# Patient Record
Sex: Female | Born: 1955
Health system: Southern US, Community
[De-identification: ages and names within clinical notes are randomized; demographics above are authoritative.]

## PROBLEM LIST (undated history)

## (undated) DIAGNOSIS — M1990 Unspecified osteoarthritis, unspecified site: Secondary | ICD-10-CM

## (undated) DIAGNOSIS — R519 Headache, unspecified: Secondary | ICD-10-CM

## (undated) DIAGNOSIS — K5792 Diverticulitis of intestine, part unspecified, without perforation or abscess without bleeding: Secondary | ICD-10-CM

## (undated) DIAGNOSIS — I2510 Atherosclerotic heart disease of native coronary artery without angina pectoris: Secondary | ICD-10-CM

## (undated) DIAGNOSIS — E7800 Pure hypercholesterolemia, unspecified: Secondary | ICD-10-CM

## (undated) DIAGNOSIS — I10 Essential (primary) hypertension: Secondary | ICD-10-CM

## (undated) DIAGNOSIS — K219 Gastro-esophageal reflux disease without esophagitis: Secondary | ICD-10-CM

## (undated) DIAGNOSIS — R51 Headache: Secondary | ICD-10-CM

## (undated) HISTORY — PX: ABDOMINAL HYSTERECTOMY: SHX81

## (undated) HISTORY — PX: DILATION AND CURETTAGE OF UTERUS: SHX78

## (undated) HISTORY — PX: TUBAL LIGATION: SHX77

## (undated) HISTORY — DX: Unspecified osteoarthritis, unspecified site: M19.90

## (undated) HISTORY — DX: Gastro-esophageal reflux disease without esophagitis: K21.9

## (undated) HISTORY — DX: Diverticulitis of intestine, part unspecified, without perforation or abscess without bleeding: K57.92

---

## 1998-01-25 ENCOUNTER — Other Ambulatory Visit: Admission: RE | Admit: 1998-01-25 | Discharge: 1998-01-25 | Payer: Self-pay | Admitting: Obstetrics and Gynecology

## 2001-03-27 ENCOUNTER — Ambulatory Visit (HOSPITAL_COMMUNITY): Admission: RE | Admit: 2001-03-27 | Discharge: 2001-03-27 | Payer: Self-pay | Admitting: Internal Medicine

## 2001-08-14 ENCOUNTER — Ambulatory Visit (HOSPITAL_COMMUNITY): Admission: RE | Admit: 2001-08-14 | Discharge: 2001-08-14 | Payer: Self-pay | Admitting: Family Medicine

## 2001-08-14 ENCOUNTER — Encounter: Payer: Self-pay | Admitting: Family Medicine

## 2006-09-04 HISTORY — PX: ESOPHAGOGASTRODUODENOSCOPY: SHX1529

## 2006-09-28 ENCOUNTER — Ambulatory Visit (HOSPITAL_COMMUNITY): Admission: RE | Admit: 2006-09-28 | Discharge: 2006-09-28 | Payer: Self-pay | Admitting: Internal Medicine

## 2006-09-28 IMAGING — RF DG ESOPHAGUS
14 of 24 series · 14 of 24 positions shown · non-contrast
Comparison: none

HISTORY: Dysphagia

[Series 1: run · 1 of 1 slices shown (1 of 14)]
[im 1/1]
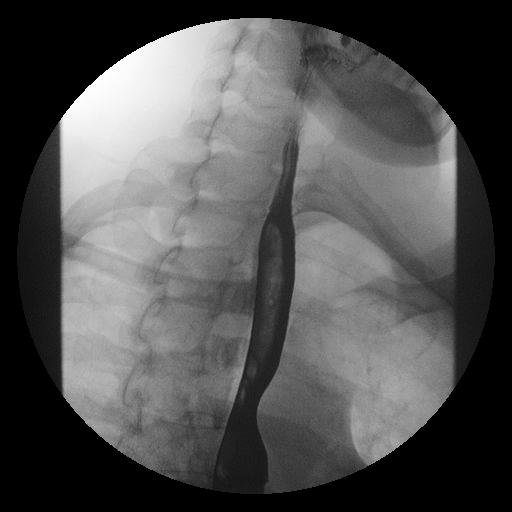

[Series 3: run · 1 of 1 slices shown (2 of 14)]
[im 1/1]
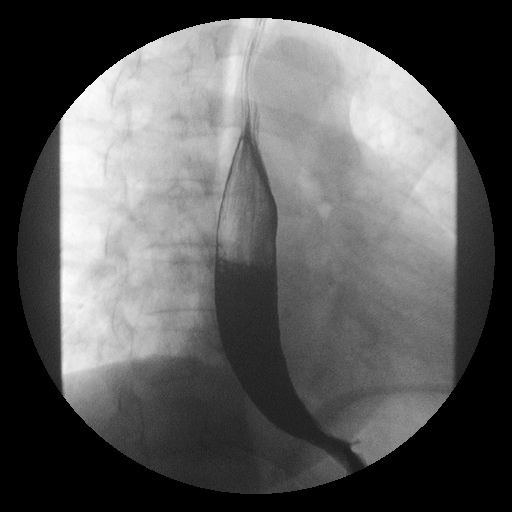

[Series 5: run · 1 of 1 slices shown (3 of 14)]
[im 1/1]
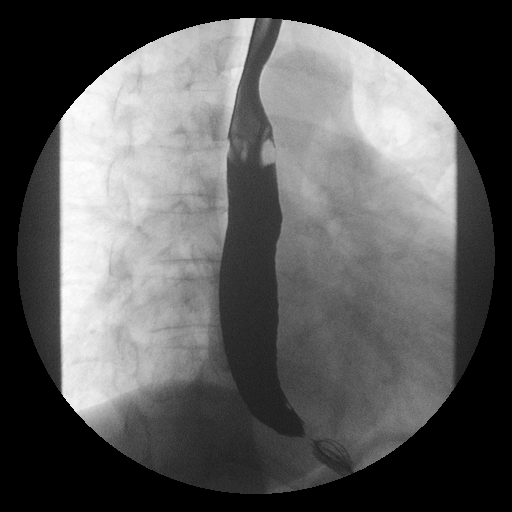

[Series 7: run · 1 of 1 slices shown (4 of 14)]
[im 1/1]
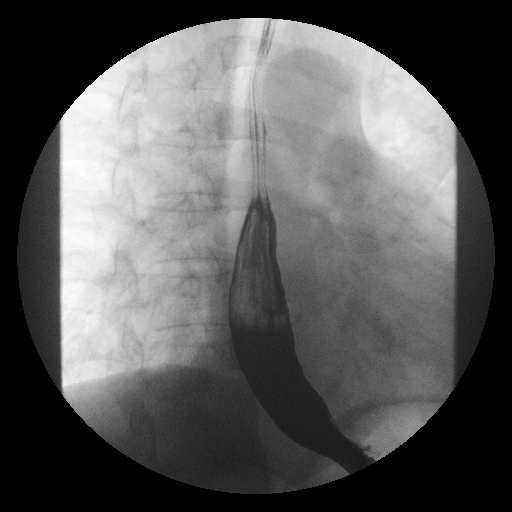

[Series 8: run · 1 of 1 slices shown (5 of 14)]
[im 1/1]
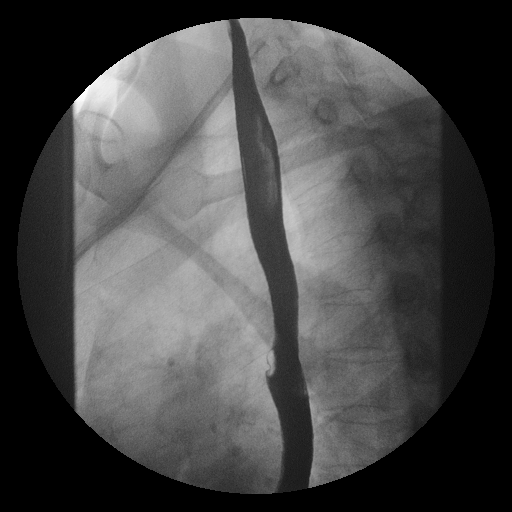

[Series 10: run · 1 of 1 slices shown (6 of 14)]
[im 1/1]
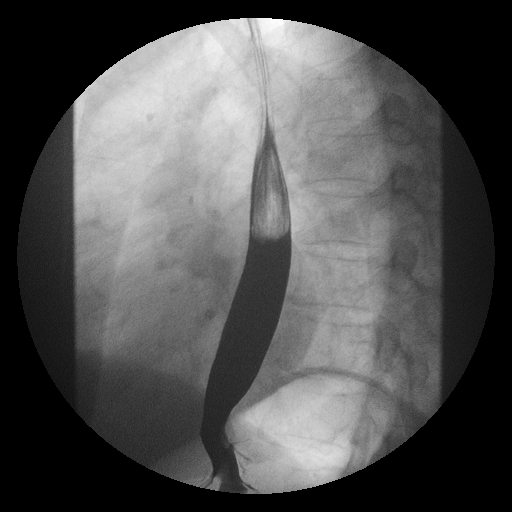

[Series 12: run · 1 of 1 slices shown (7 of 14)]
[im 1/1]
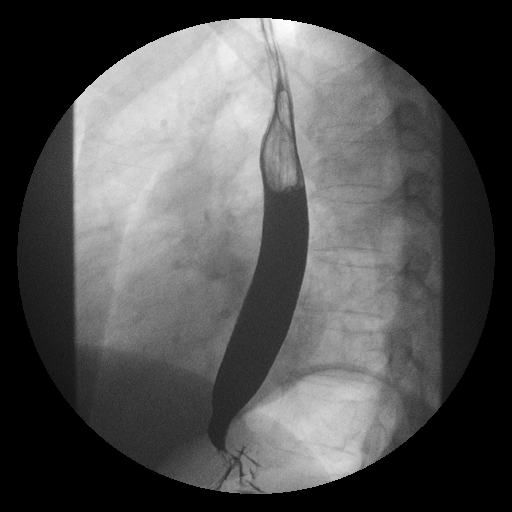

[Series 13: run · 1 of 9 slices shown (8 of 14)]
[im 1/9]
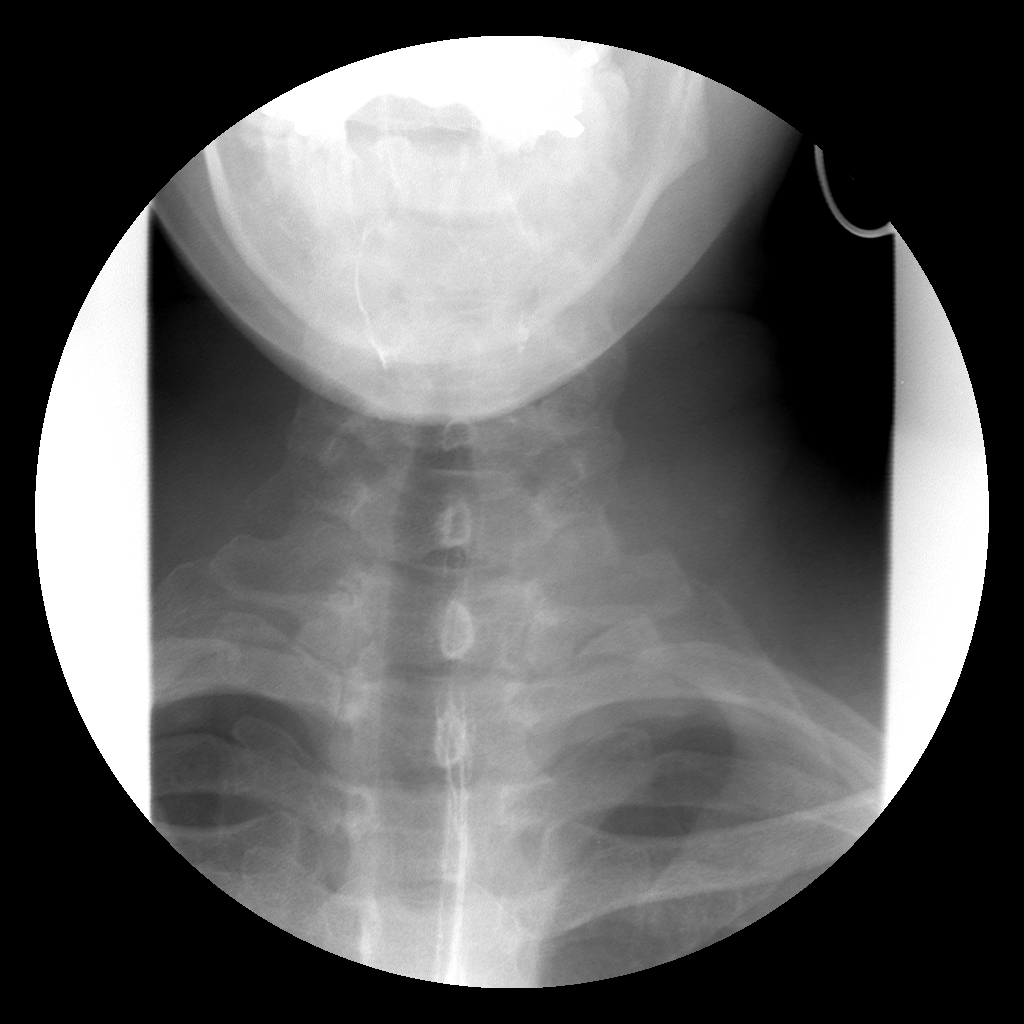

[Series 15: run · 1 of 1 slices shown (9 of 14)]
[im 1/1]
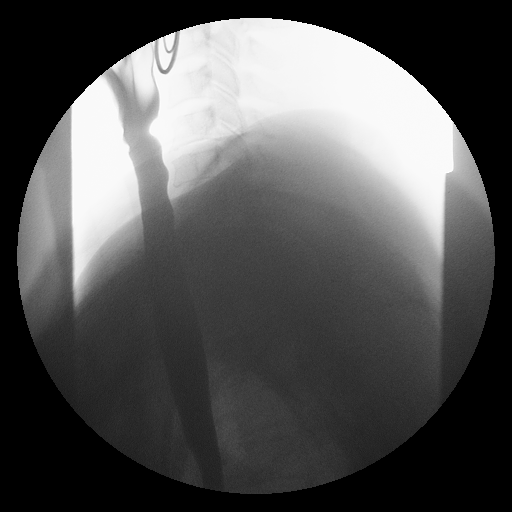

[Series 17: run · 1 of 1 slices shown (10 of 14)]
[im 1/1]
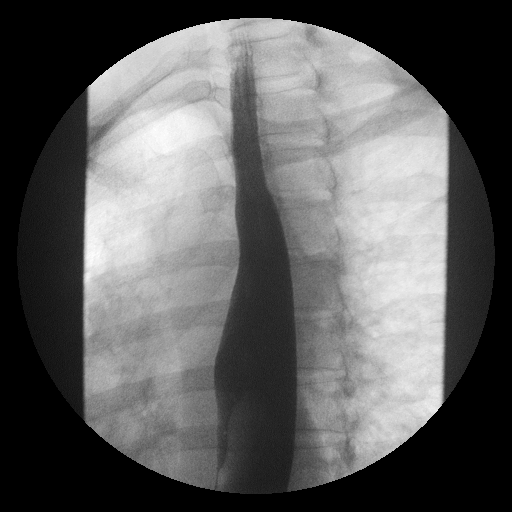

[Series 19: run · 1 of 1 slices shown (11 of 14)]
[im 1/1]
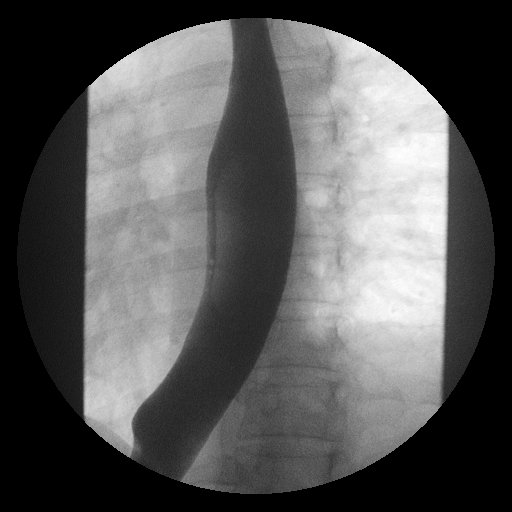

[Series 20: run · 1 of 1 slices shown (12 of 14)]
[im 1/1]
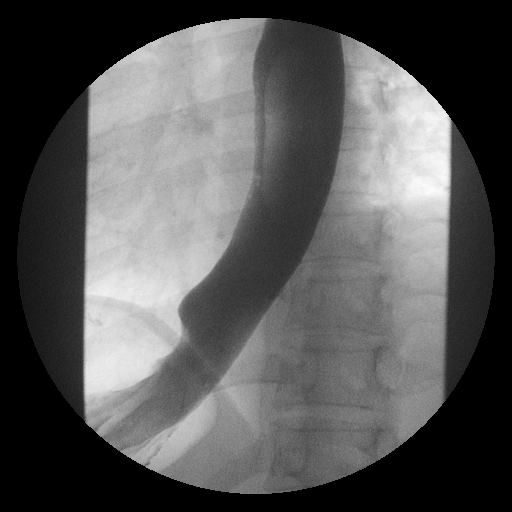

[Series 22: run · 1 of 1 slices shown (13 of 14)]
[im 1/1]
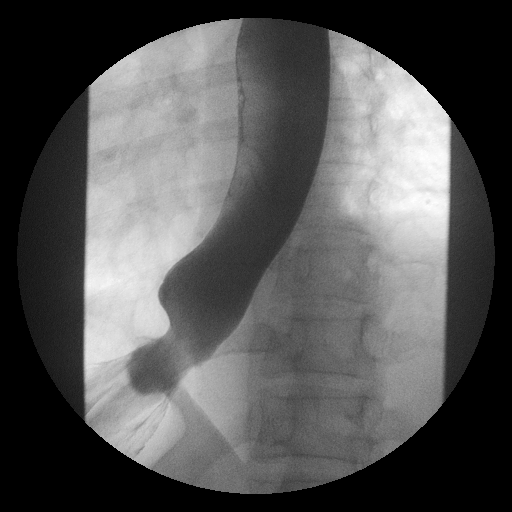

[Series 25: run · 1 of 1 slices shown (14 of 14)]
[im 1/1]
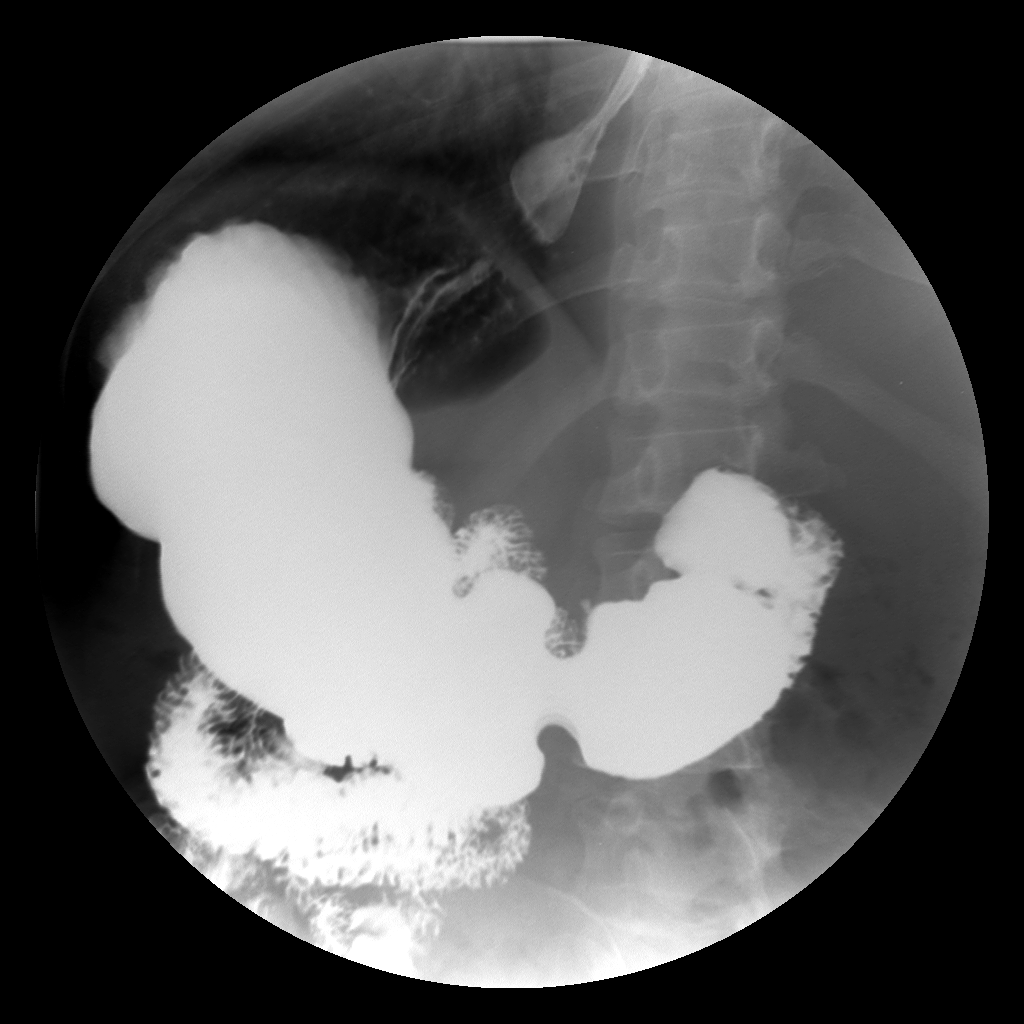

[14 of 24 positions shown; findings below may reference images not displayed]

ESOPHAGRAM:

Routine air-contrast, single-contrast, and tablet esophagram performed.

Normal esophageal distention and motility.
No esophageal stricture or obstruction.
Normal clearance of barium by primary peristaltic wave.
No mucosal irregularity on air-contrast images.
No persistent intraluminal filling defects.
Targeted rapid sequence views of hypopharynx and cervical esophagus normal.
12.5 mm diameter barium tablet freely passes from oral cavity to stomach.
IMPRESSION: Normal exam.

## 2006-10-09 ENCOUNTER — Ambulatory Visit: Payer: Self-pay | Admitting: Internal Medicine

## 2006-10-25 ENCOUNTER — Ambulatory Visit: Payer: Self-pay | Admitting: Internal Medicine

## 2006-10-25 ENCOUNTER — Ambulatory Visit (HOSPITAL_COMMUNITY): Admission: RE | Admit: 2006-10-25 | Discharge: 2006-10-25 | Payer: Self-pay | Admitting: Internal Medicine

## 2006-11-20 ENCOUNTER — Encounter (HOSPITAL_COMMUNITY): Admission: RE | Admit: 2006-11-20 | Discharge: 2006-12-20 | Payer: Self-pay | Admitting: Internal Medicine

## 2007-09-12 ENCOUNTER — Encounter (INDEPENDENT_AMBULATORY_CARE_PROVIDER_SITE_OTHER): Payer: Self-pay | Admitting: Interventional Radiology

## 2007-09-12 ENCOUNTER — Encounter: Admission: RE | Admit: 2007-09-12 | Discharge: 2007-09-12 | Payer: Self-pay | Admitting: Endocrinology

## 2007-09-12 ENCOUNTER — Other Ambulatory Visit: Admission: RE | Admit: 2007-09-12 | Discharge: 2007-09-12 | Payer: Self-pay | Admitting: Interventional Radiology

## 2007-09-12 IMAGING — US US SOFT TISSUE HEAD/NECK
1 series · 14 of 25 positions shown · non-contrast
Comparison: none

CLINICAL DATA: Palpable thyroid lesion with defect on scintigraphy.

[Series 1: us soft tissue head/neck · 0.07mm/px · 14 of 31 slices shown]
[im 1/31]
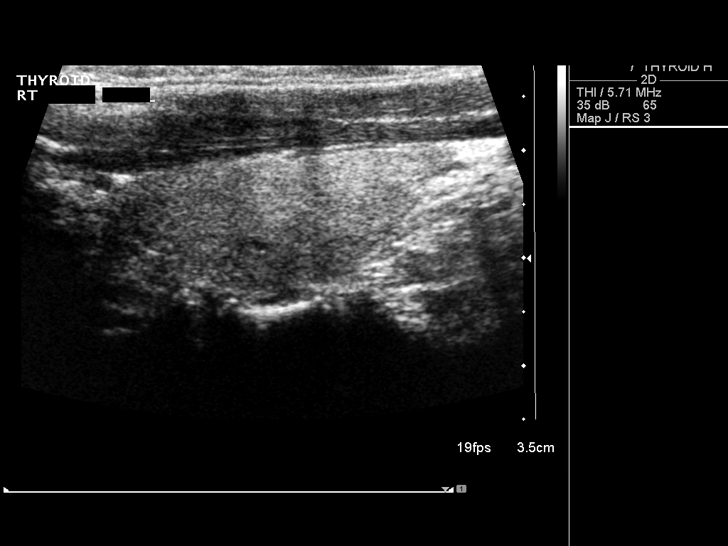
[im 3/31]
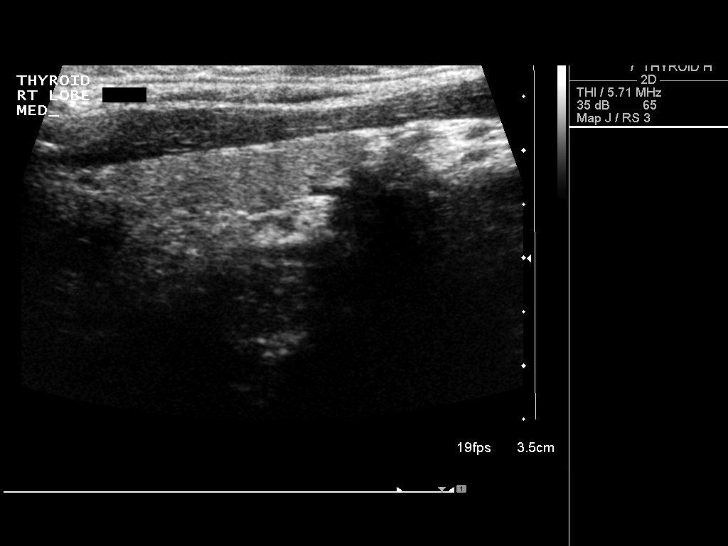
[im 6/31]
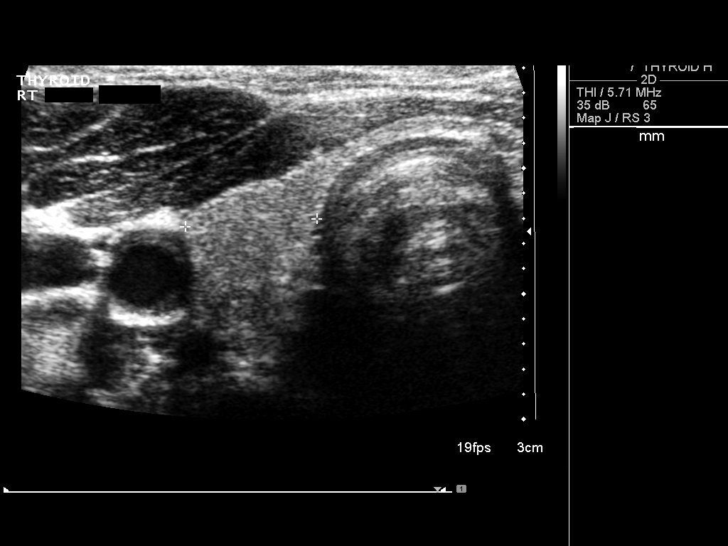
[im 8/31]
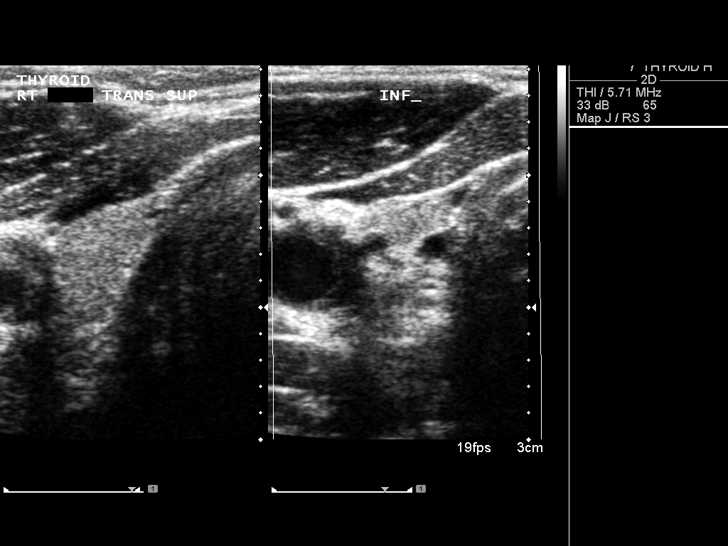
[im 11/31]
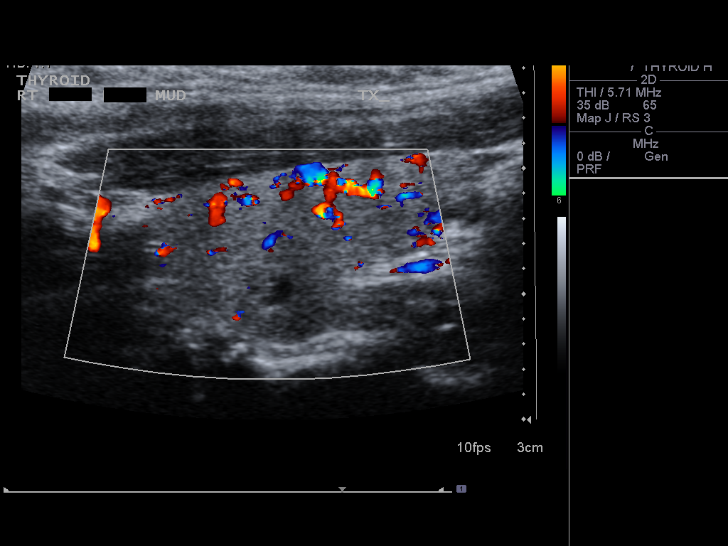
[im 12/31]
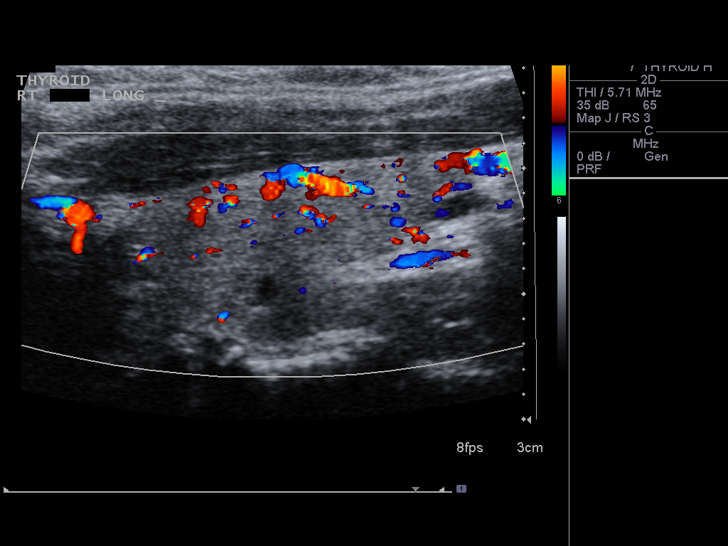
[im 14/31]
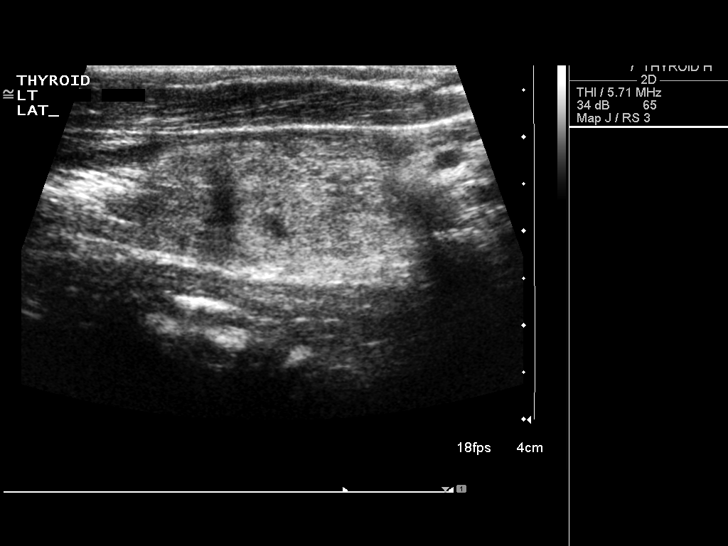
[im 17/31]
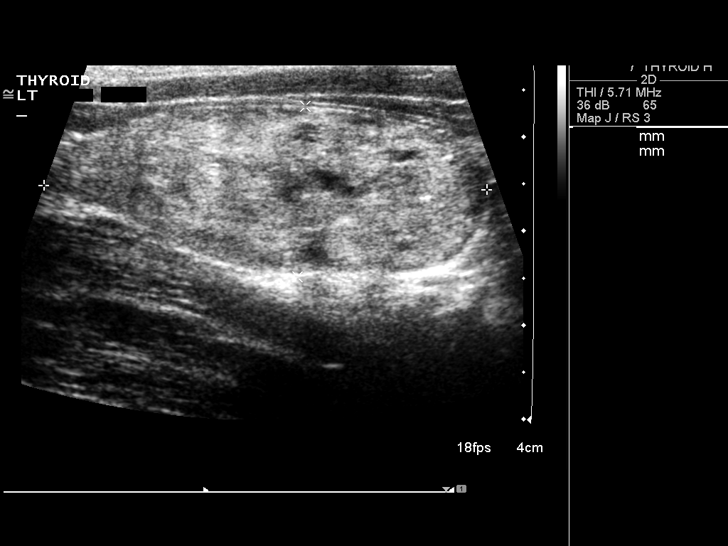
[im 19/31]
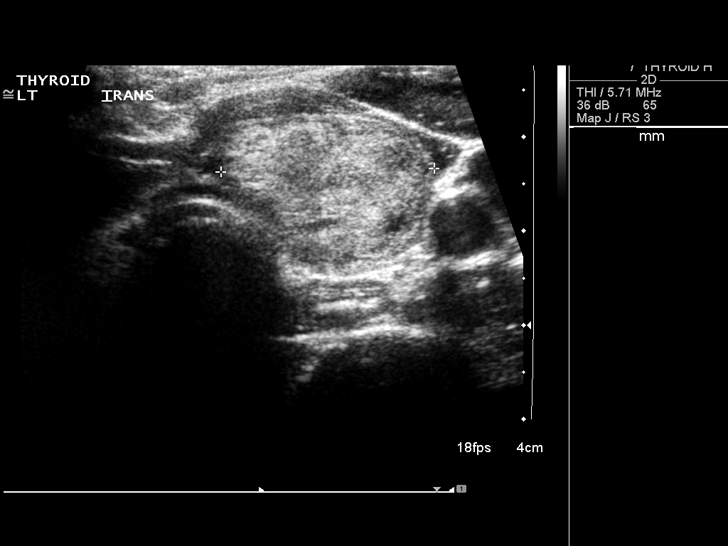
[im 21/31]
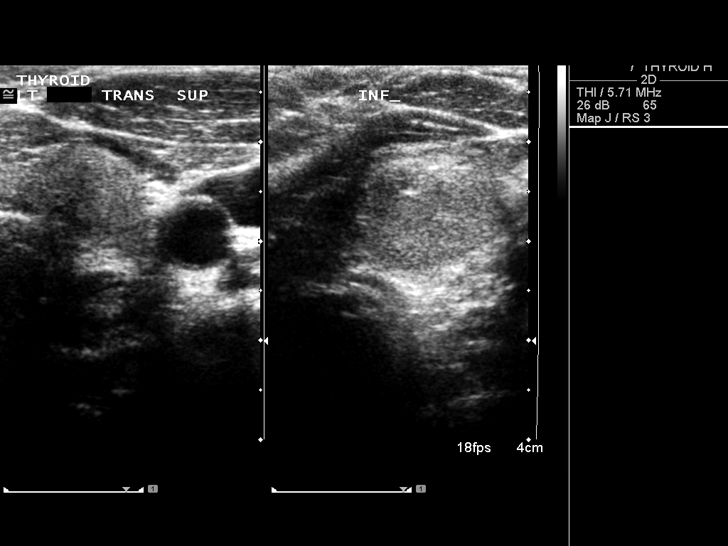
[im 23/31]
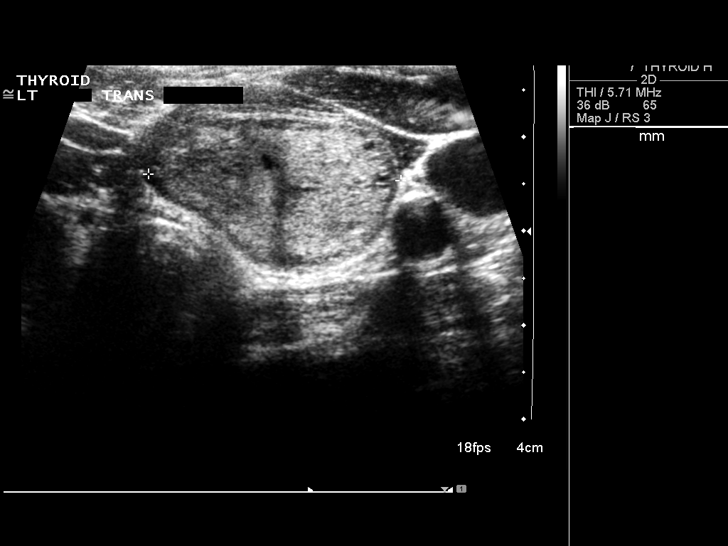
[im 26/31]
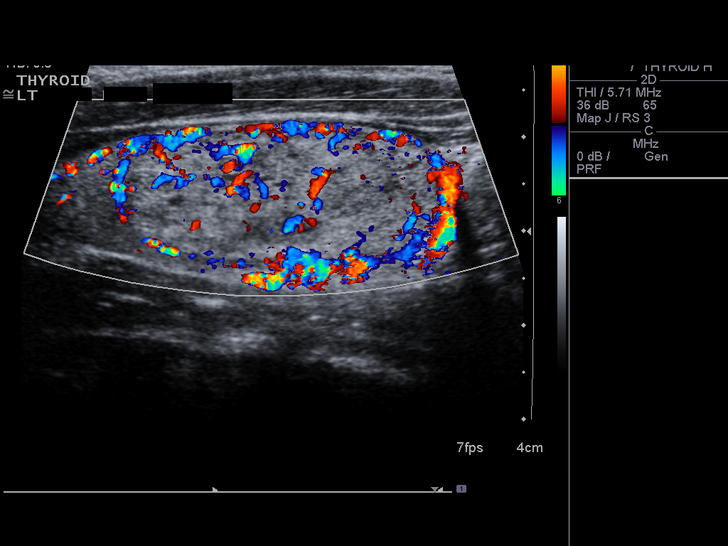
[im 28/31]
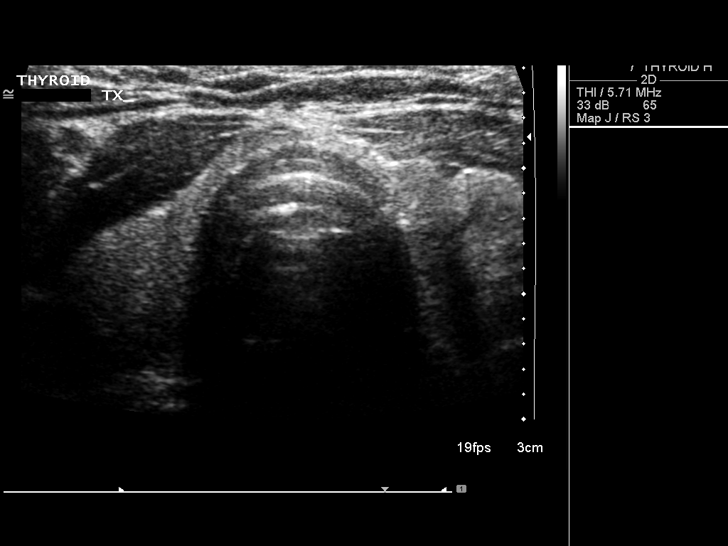
[im 31/31]
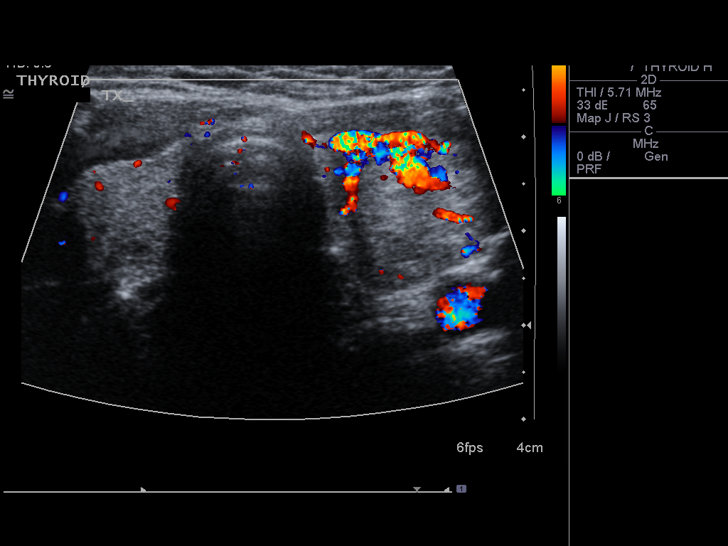

[14 of 25 positions shown; findings below may reference images not displayed]

Thyroid ultrasound:

Comparison to scintigraphy [DATE]. Left lobe measures 19 x 23 x 50 mm. There
is a solid-appearing 17 x 27 x 37 mm complex nodule in the lower pole
corresponding to the defect on scintigraphy. Scattered microcalcifications are
noted. The right lobe measures 11 x 13 x 33 mm. There is a tiny 2 x 3 x 3 mm
hypoechoic nodule in its interpolar region. The isthmus measures only 1 mm in
thickness, unremarkable.
IMPRESSION: 1. Dominant 3.7 cm left thyroid nodule. Biopsy is scheduled.

## 2007-09-12 IMAGING — US US BIOPSY
1 series · 11 of 11 positions shown · non-contrast
Comparison: Thyroid ultrasound earlier today, [DATE] ? [HOSPITAL].
 ULTRASOUND-GUIDED FINE NEEDLE ASPIRATION, LEFT LOBE OF THYROID:

CLINICAL DATA: Dominant 2 cm nodule within the left lobe of the thyroid.  Fine needle aspiration has been requested.

[Series 1: us biopsy · 11 acquisitions, 11 frames shown]
[im 1/11]
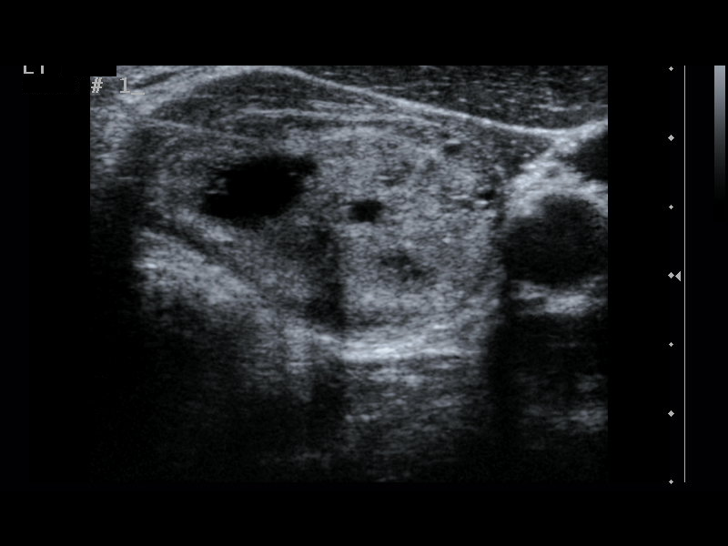
[im 2/11]
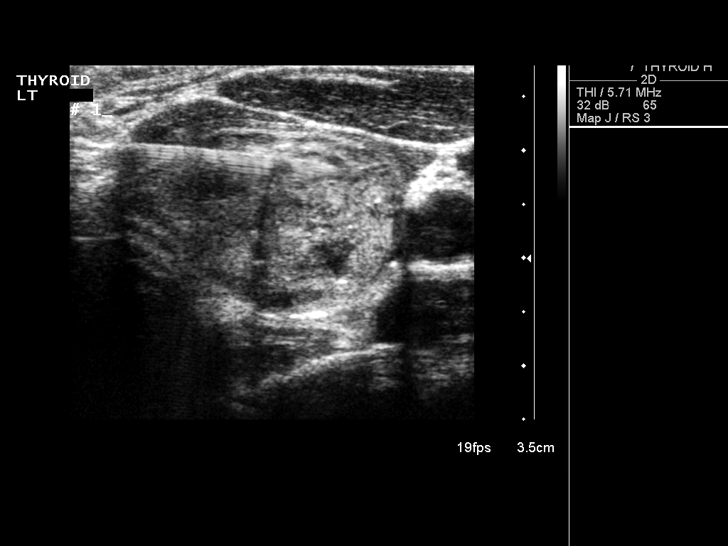
[im 3/11]
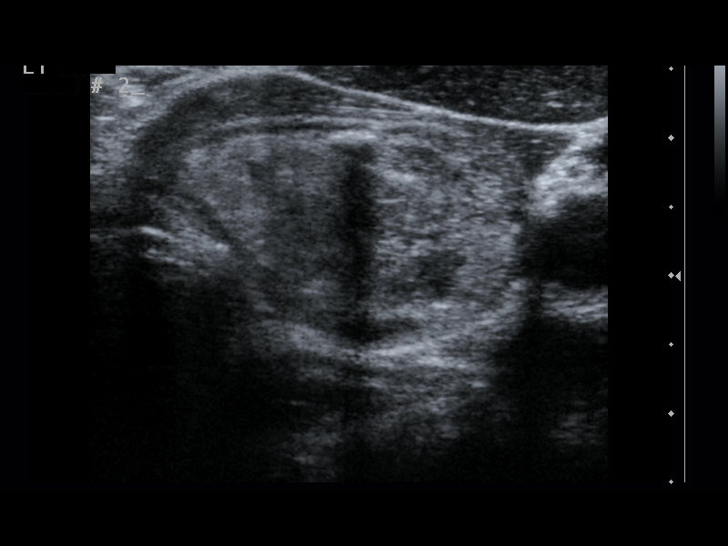
[im 4/11]
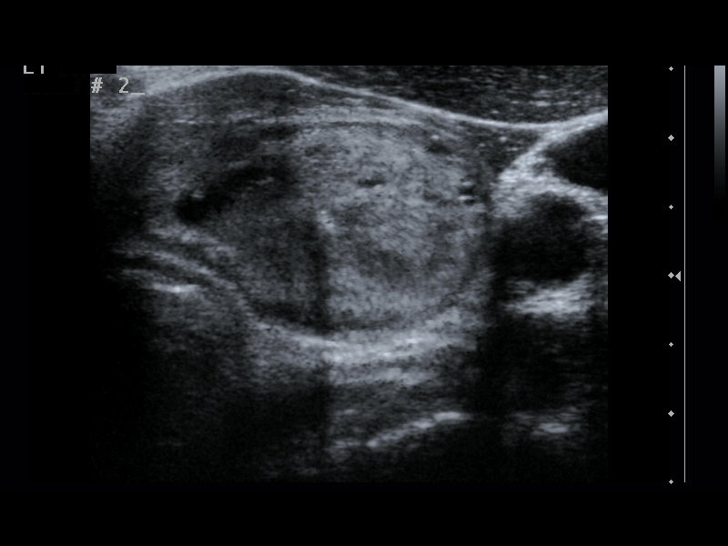
[im 5/11]
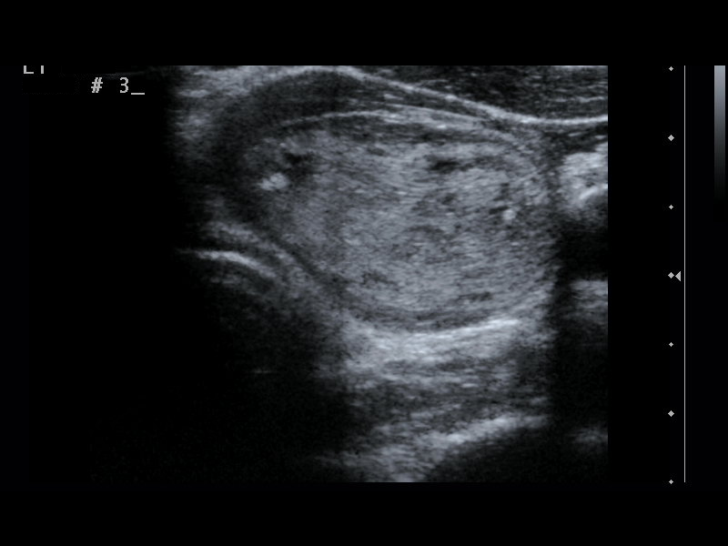
[im 6/11]
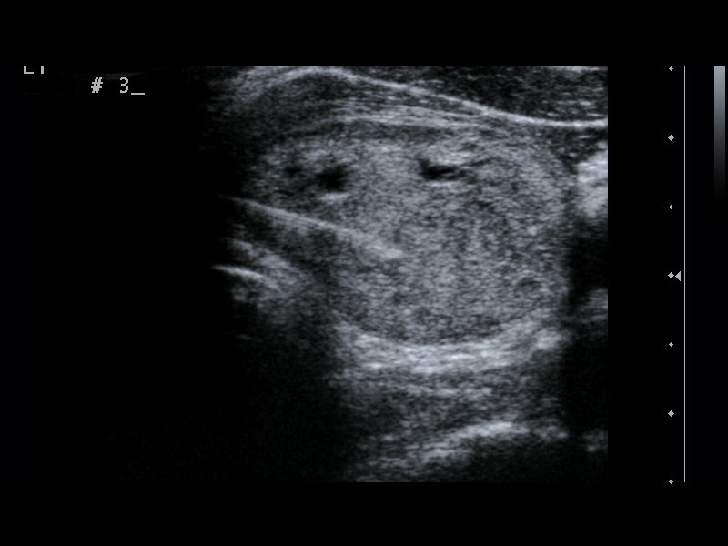
[im 7/11]
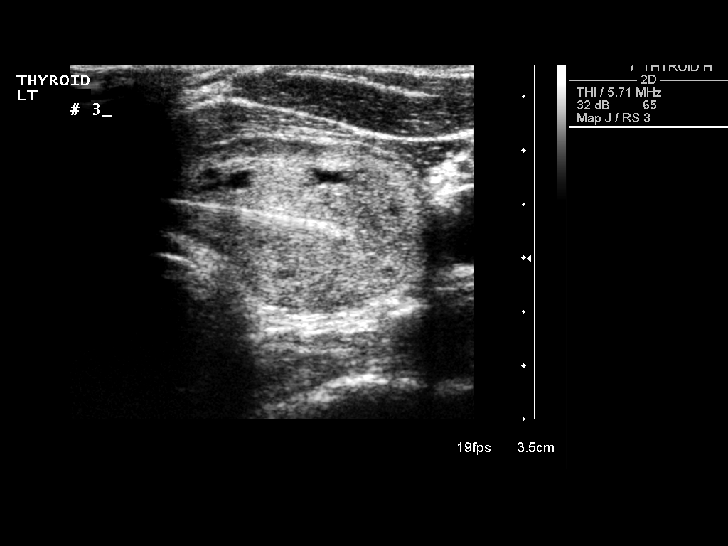
[im 8/11]
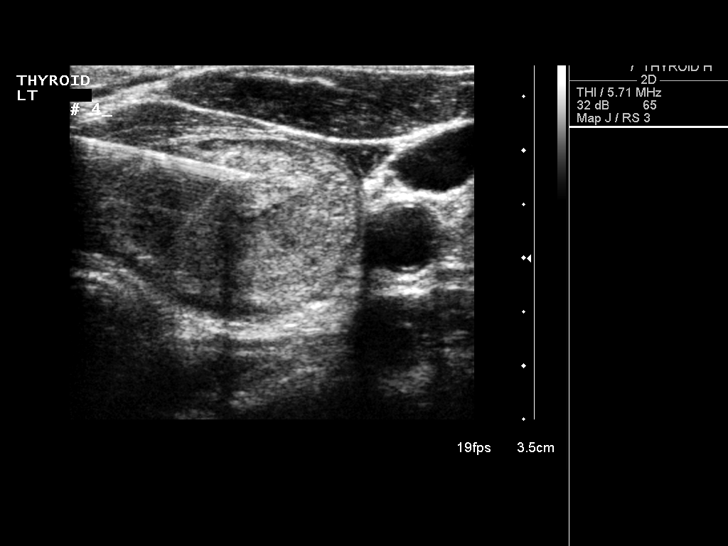
[im 9/11]
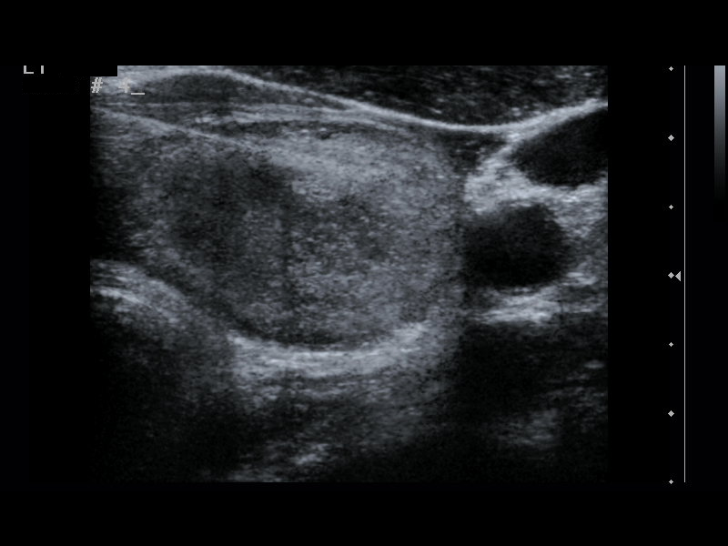
[im 10/11]
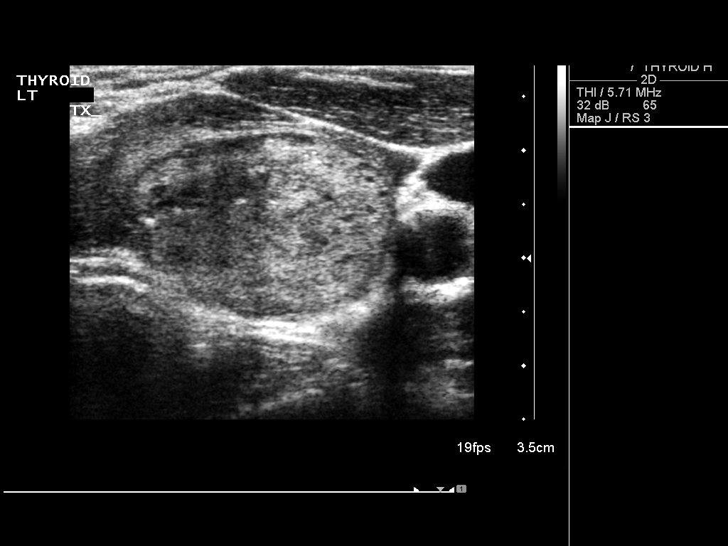
[im 11/11]
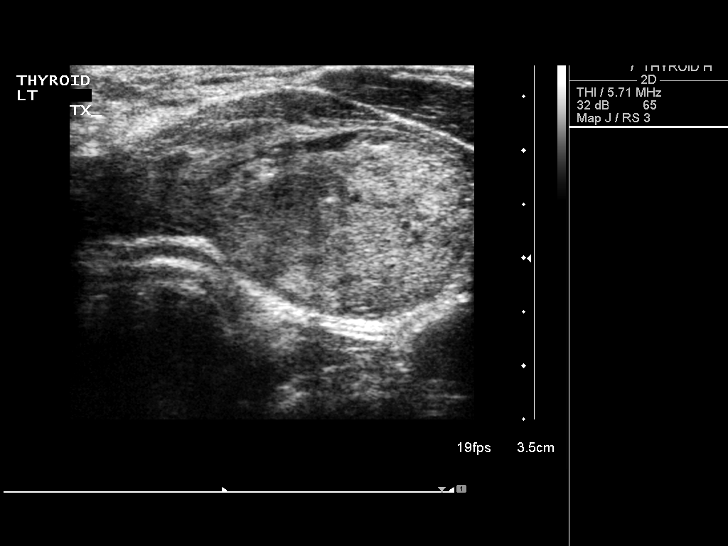

[11 of 11 positions shown; findings below may reference images not displayed]

FINDINGS: The above procedure was thoroughly discussed with the patient and written informed consent was obtained. 
 Ultrasound was then performed to localize and mark an adequate site for the biopsy.  The patient was then prepped and draped in a normal sterile fashion.  1% lidocaine was used for local anesthesia.  Using direct ultrasound guidance, four passes were made using a 25 gauge hypodermic needle into the dominant nodule occupying most of the left lobe of the thyroid measuring just over 2 cm.  Ultrasound confirmed placement of the needle on all four occasions.  The specimens were given to Pathology for further analysis.  Post procedure imaging demonstrated no hematoma or immediate complication.  The patient tolerated the procedure well.
IMPRESSION: Successful ultrasound-guided fine needle aspiration, dominant nodule, left lobe of the thyroid.  Final pathology pending.

## 2007-10-24 ENCOUNTER — Ambulatory Visit (HOSPITAL_COMMUNITY): Admission: RE | Admit: 2007-10-24 | Discharge: 2007-10-24 | Payer: Self-pay | Admitting: Internal Medicine

## 2007-11-08 ENCOUNTER — Ambulatory Visit (HOSPITAL_COMMUNITY): Admission: RE | Admit: 2007-11-08 | Discharge: 2007-11-08 | Payer: Self-pay | Admitting: Internal Medicine

## 2007-11-08 ENCOUNTER — Ambulatory Visit: Payer: Self-pay | Admitting: Internal Medicine

## 2007-11-08 ENCOUNTER — Encounter: Payer: Self-pay | Admitting: Internal Medicine

## 2007-11-08 HISTORY — PX: COLONOSCOPY: SHX174

## 2008-12-16 ENCOUNTER — Ambulatory Visit (HOSPITAL_COMMUNITY): Admission: RE | Admit: 2008-12-16 | Discharge: 2008-12-16 | Payer: Self-pay | Admitting: Internal Medicine

## 2009-04-27 ENCOUNTER — Ambulatory Visit (HOSPITAL_COMMUNITY): Admission: RE | Admit: 2009-04-27 | Discharge: 2009-04-27 | Payer: Self-pay | Admitting: Obstetrics & Gynecology

## 2009-04-27 IMAGING — MG MM DIGITAL SCREENING
5 series · 5 of 5 positions shown · non-contrast
Comparison: Prior studies.

DG SCREEN MAMMOGRAM BILATERAL
Bilateral CC and MLO view(s) were taken.
Technologist: [REDACTED]

DIGITAL SCREENING MAMMOGRAM WITH CAD:

[L CC (1 of 2)]
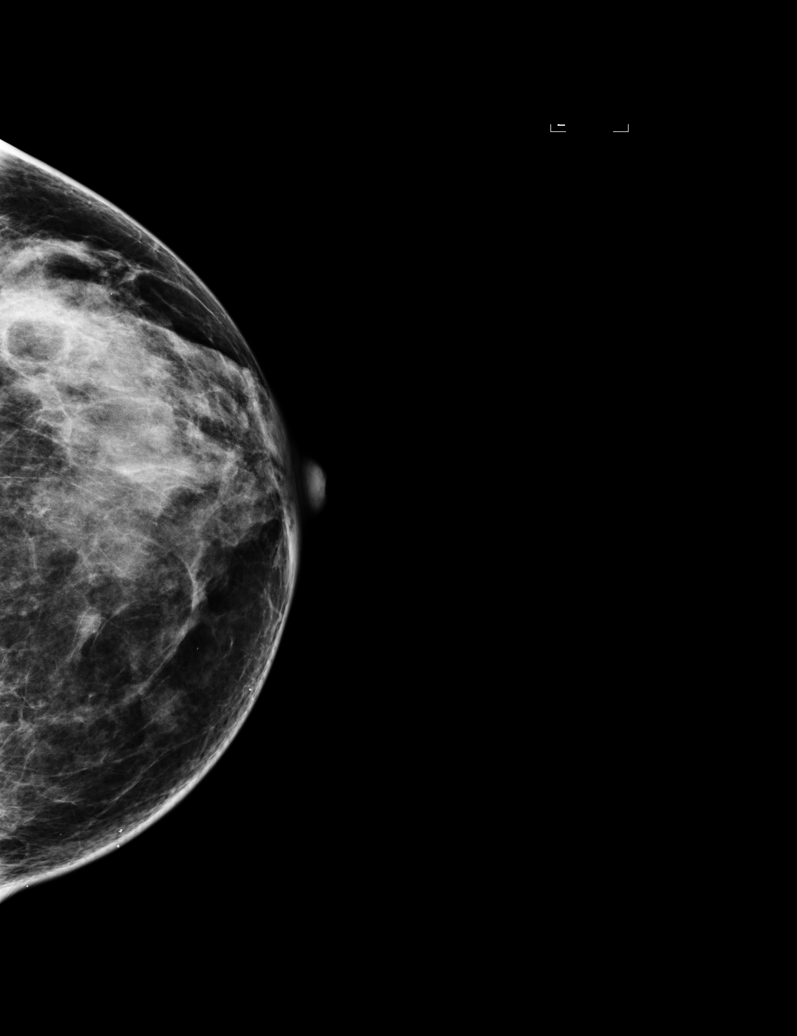

[L MLO]
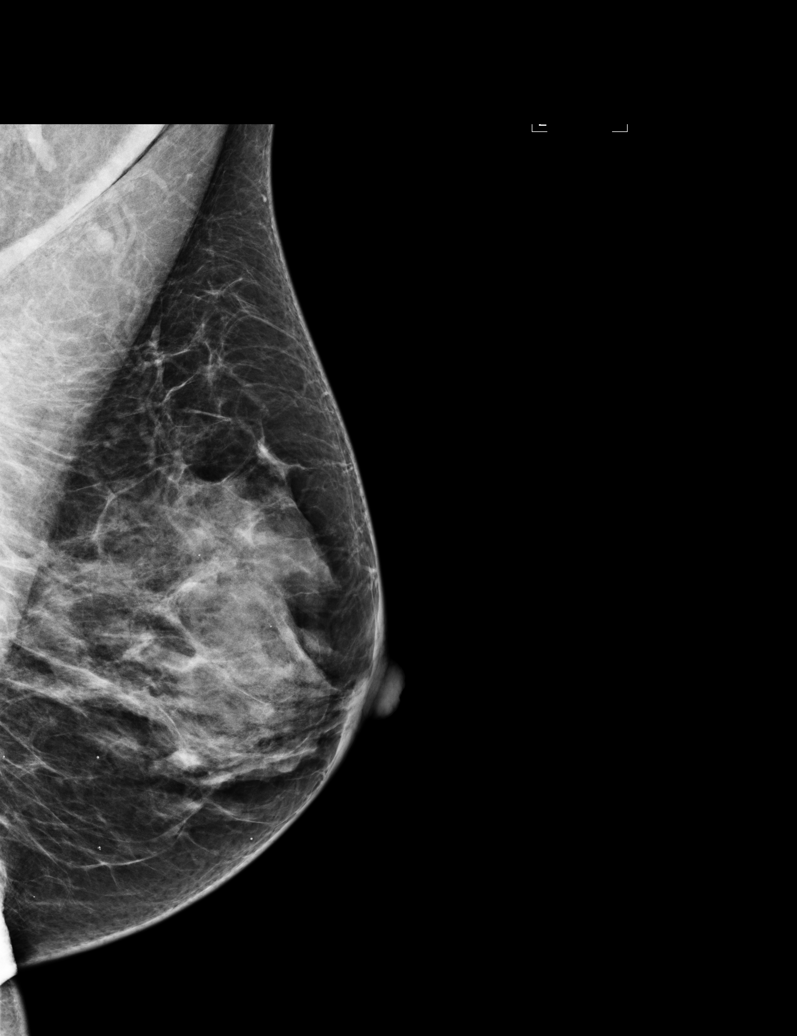

[R CC]
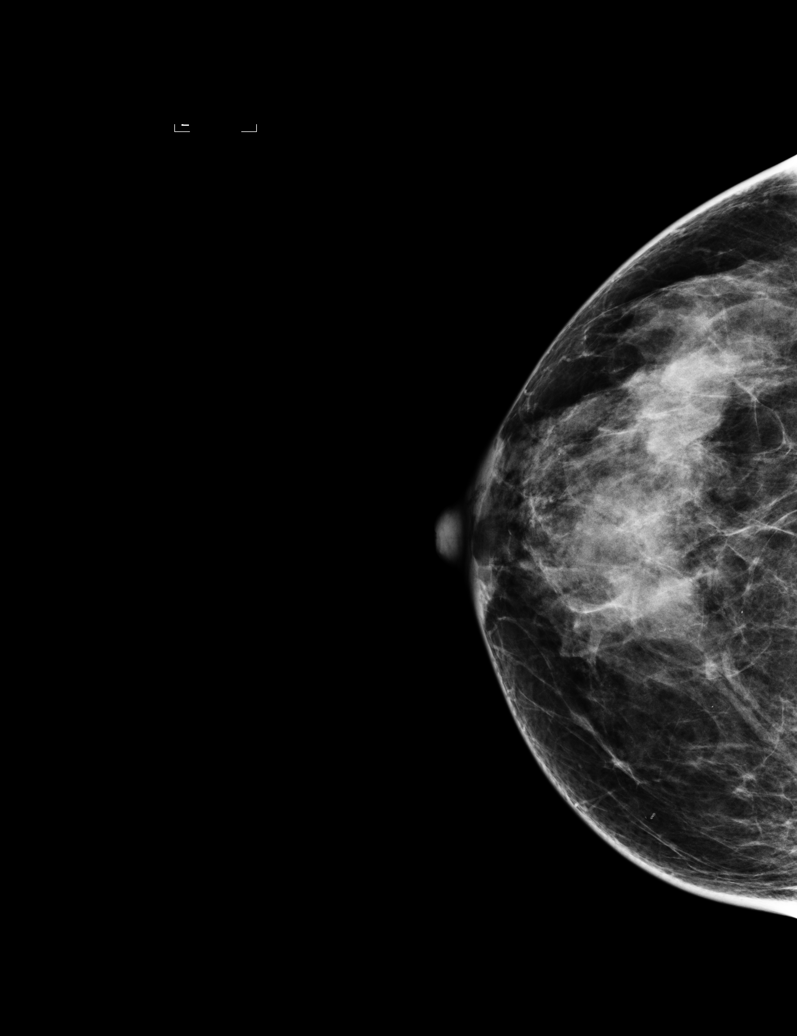

[R MLO]
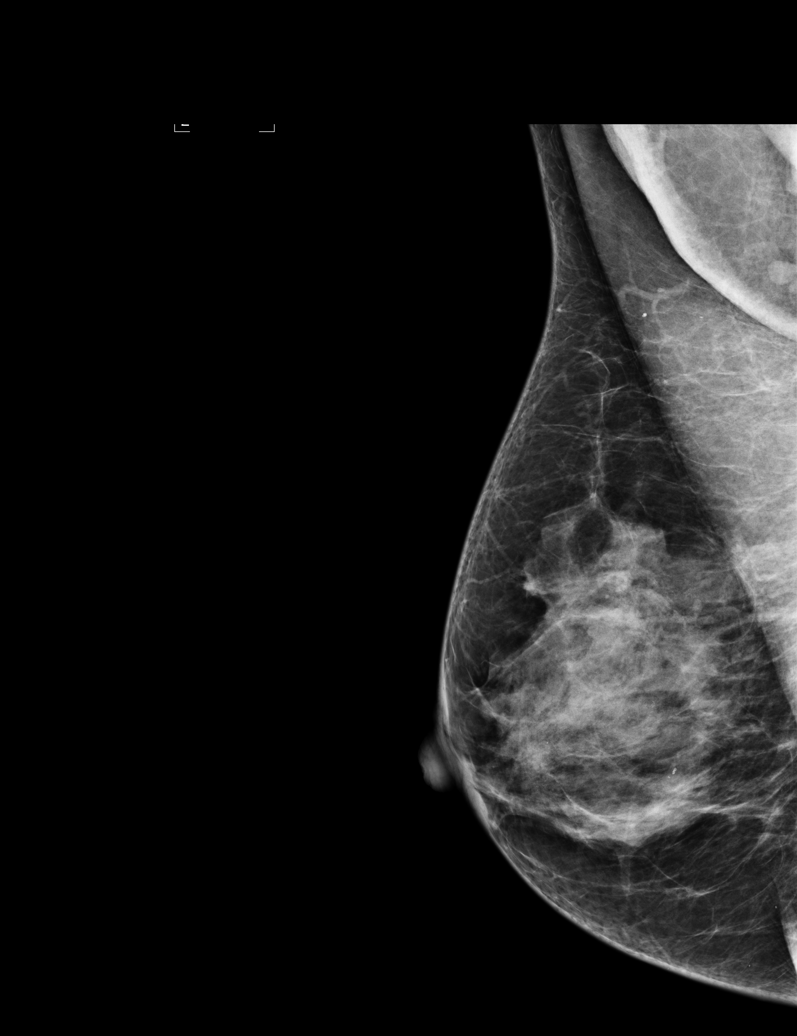

[L CC (2 of 2)]
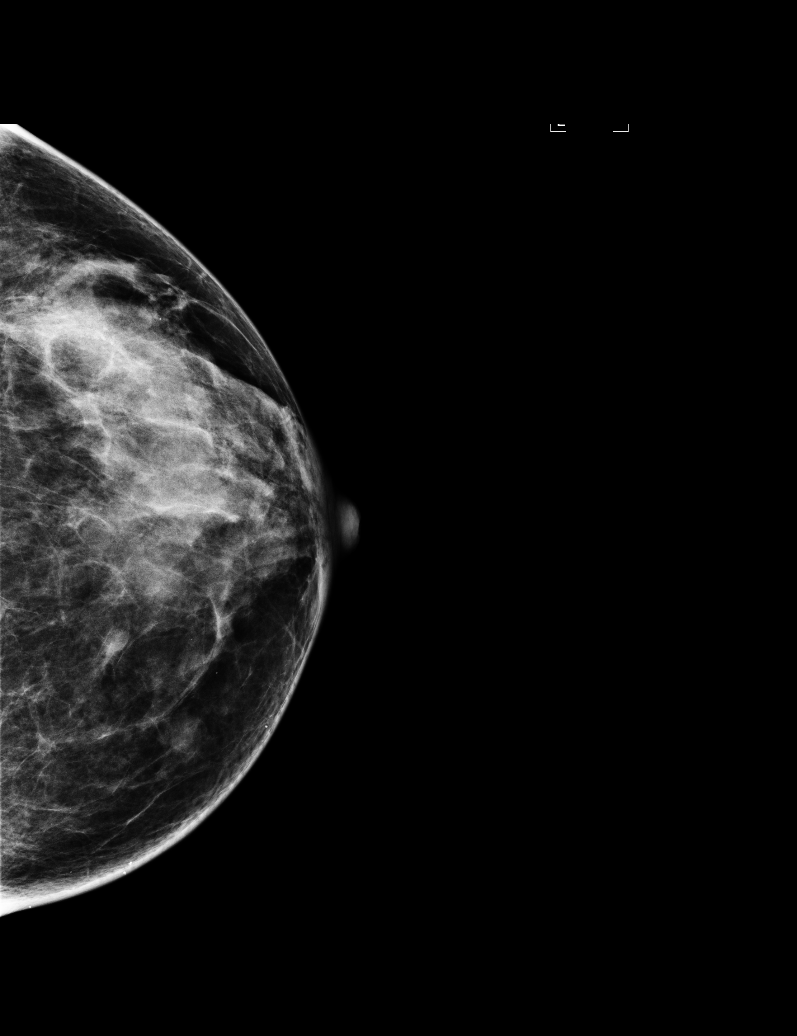

[5 of 5 positions shown; findings below may reference images not displayed]

The breast tissue is heterogeneously dense.  There is no dominant mass, architectural distortion or
calcification to suggest malignancy.

Images were processed with CAD.
IMPRESSION: No mammographic evidence of malignancy.  Suggest yearly screening mammography.

A result letter of this screening mammogram will be mailed directly to the patient.

ASSESSMENT: Negative - BI-RADS 1

Screening mammogram in 1 year.
,

## 2009-06-30 ENCOUNTER — Ambulatory Visit (HOSPITAL_COMMUNITY): Admission: RE | Admit: 2009-06-30 | Discharge: 2009-06-30 | Payer: Self-pay | Admitting: Internal Medicine

## 2009-06-30 IMAGING — US US SOFT TISSUE HEAD/NECK
1 series · 14 of 25 positions shown · non-contrast
Comparison: [DATE]

CLINICAL DATA: Surveillance of a thyroid nodule

THYROID ULTRASOUND
TECHNIQUE: Ultrasound examination of the thyroid gland and
adjacent soft tissues was performed.

[Series 1: us soft tissue head/neck · 0.07mm/px · 14 of 43 slices shown]
[im 1/43]
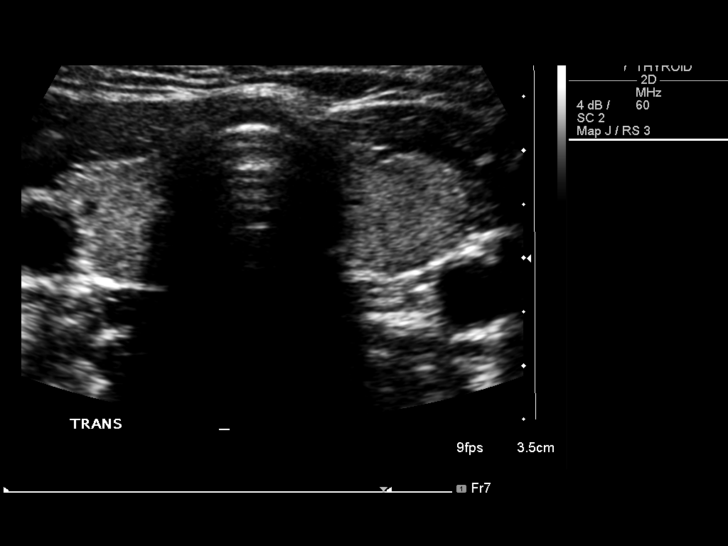
[im 4/43]
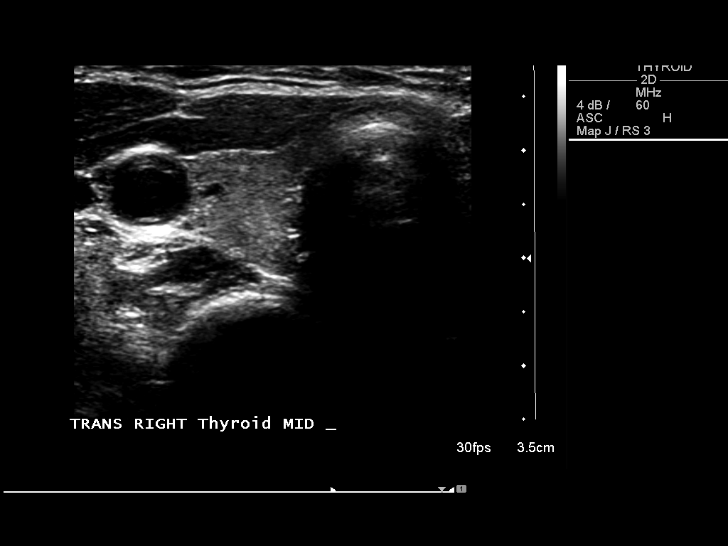
[im 8/43]
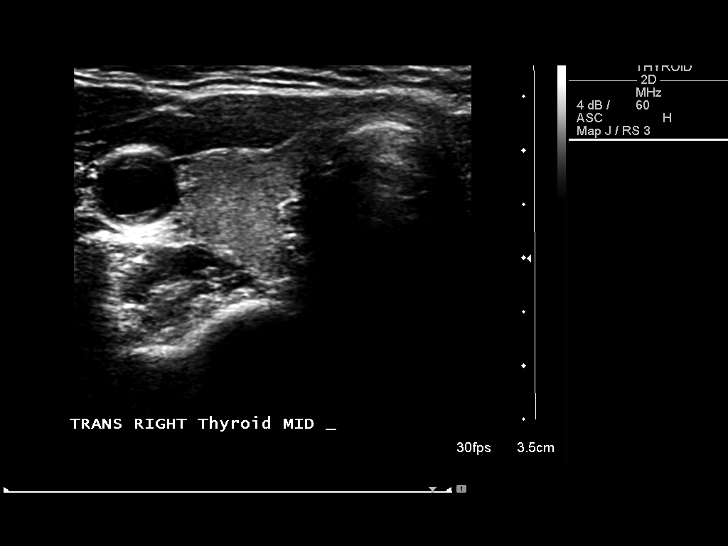
[im 11/43]
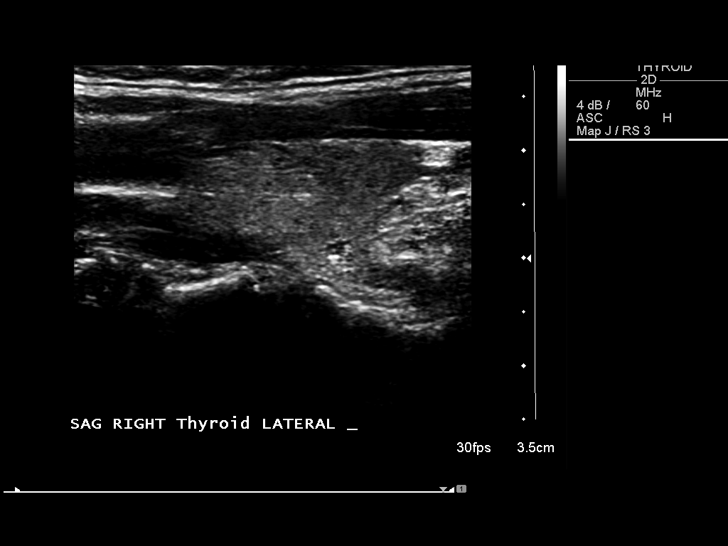
[im 15/43]
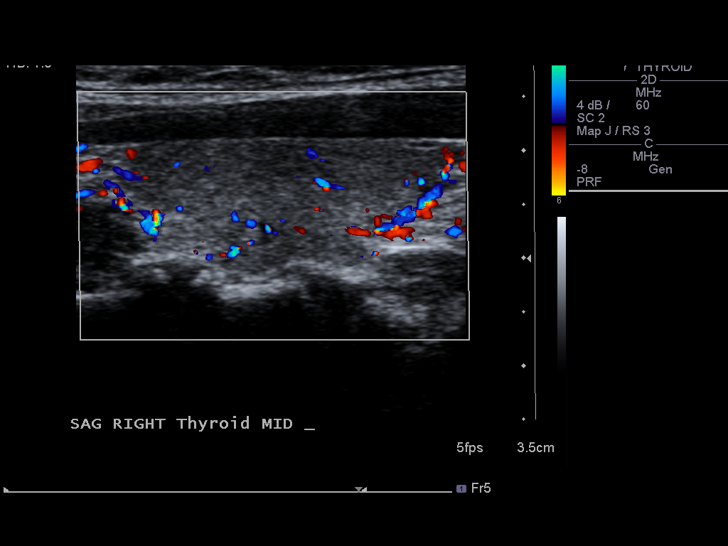
[im 16/43]
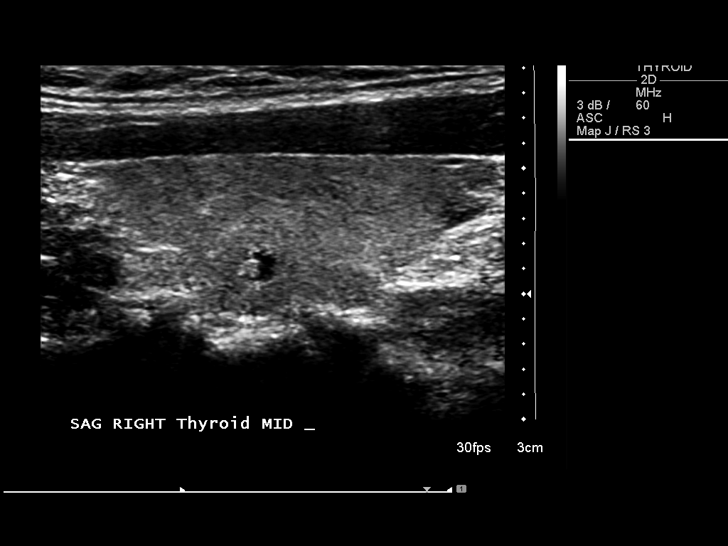
[im 20/43]
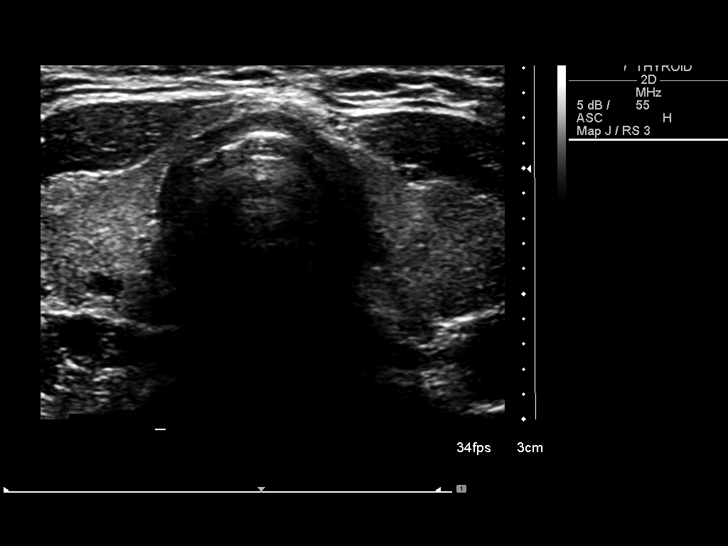
[im 23/43]
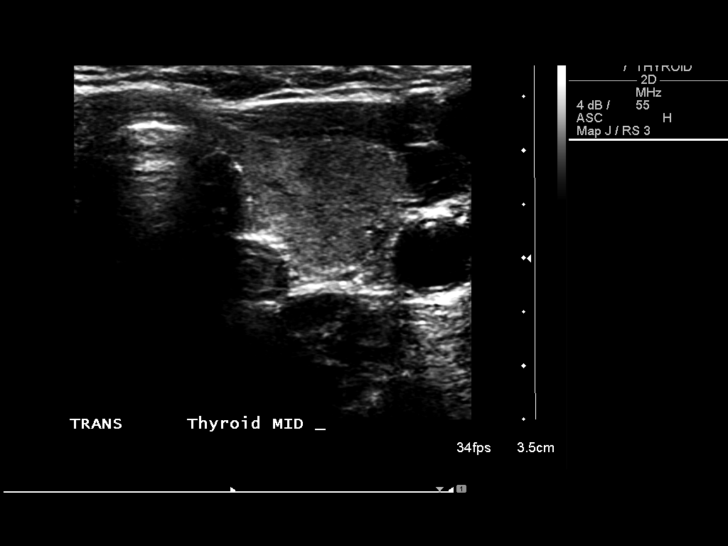
[im 27/43]
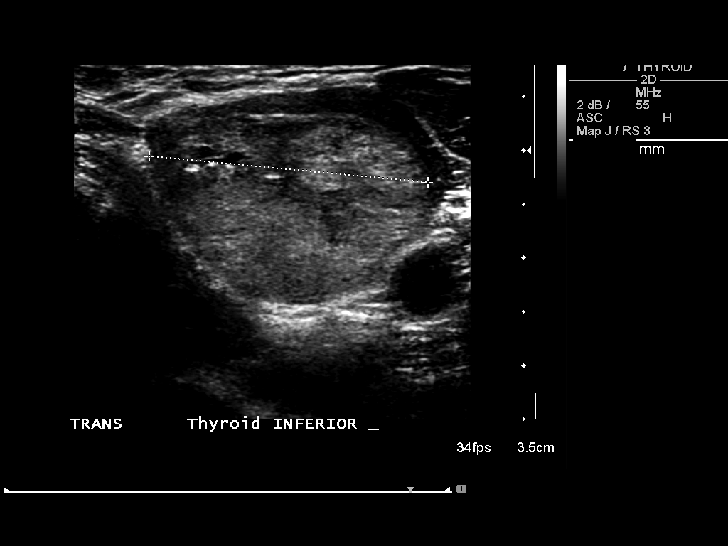
[im 29/43]
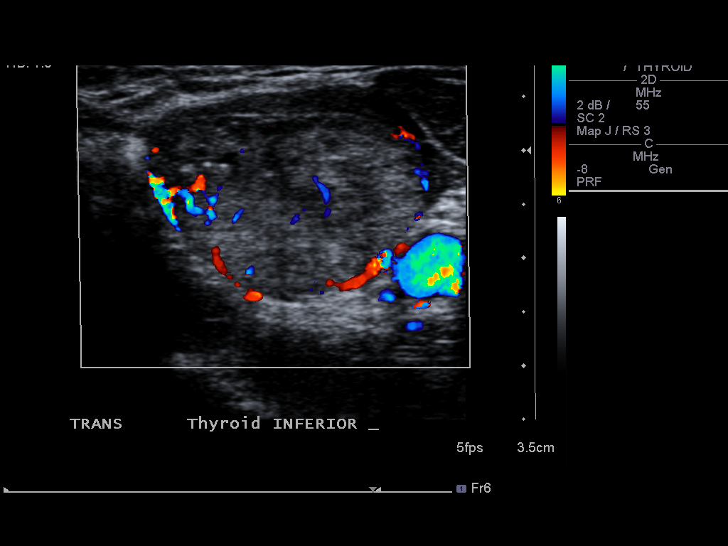
[im 32/43]
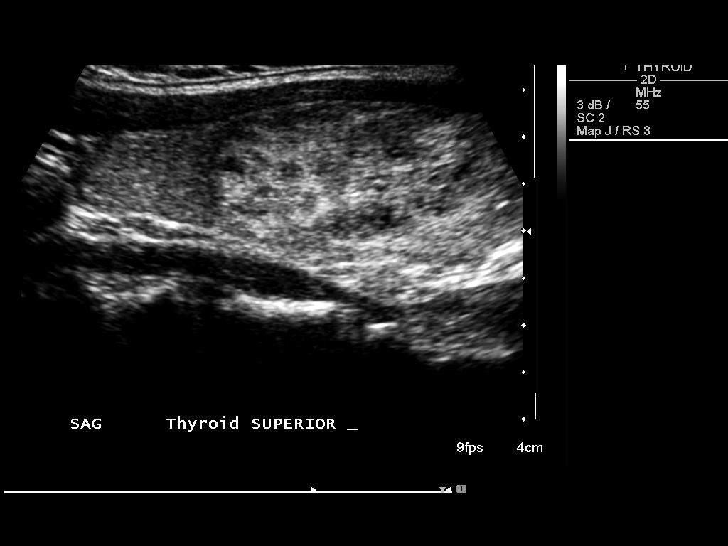
[im 36/43]
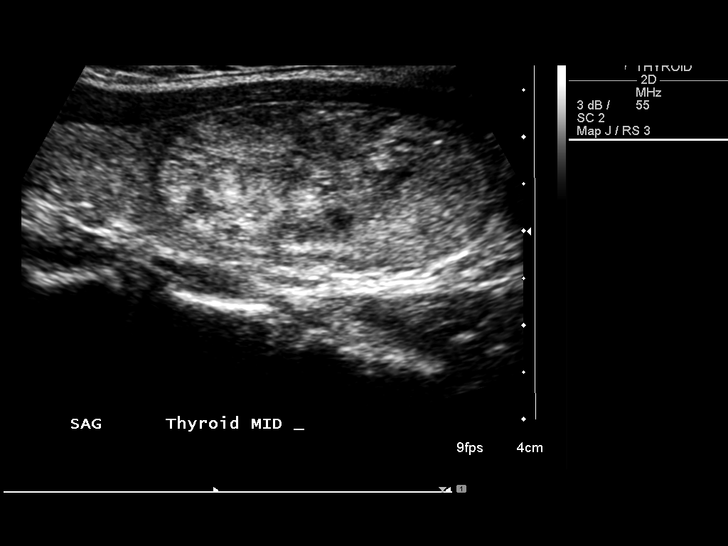
[im 39/43]
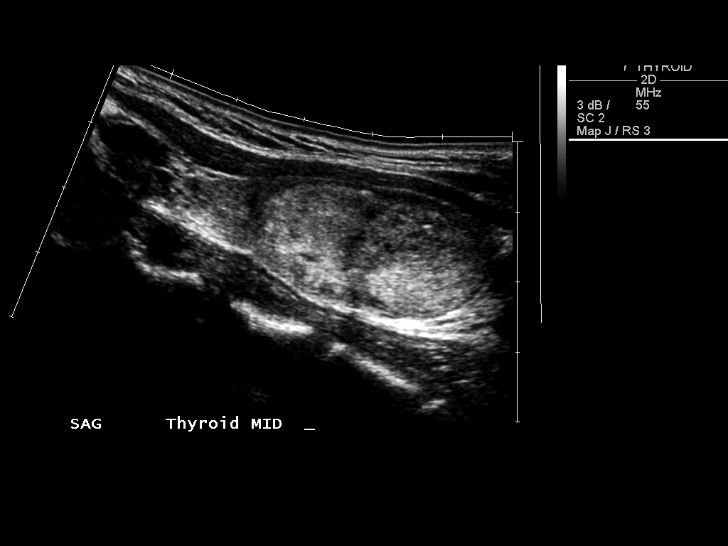
[im 43/43]
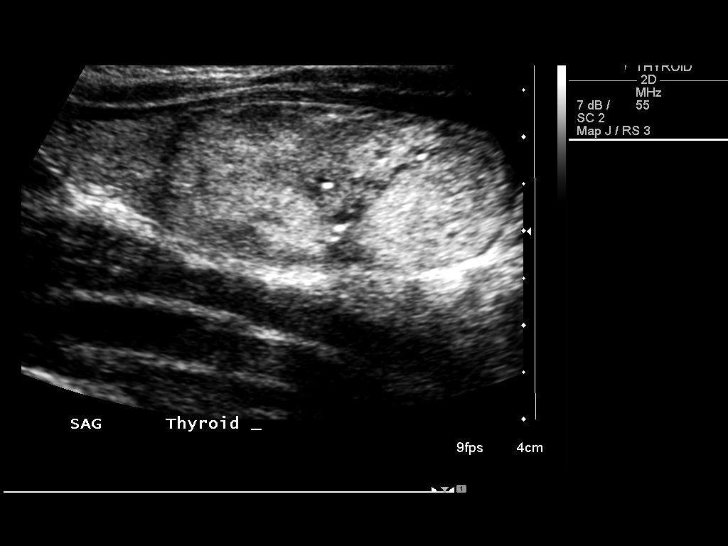

[14 of 25 positions shown; findings below may reference images not displayed]

FINDINGS: Right thyroid lobe 3.7 x 1.3 x 1.2 cm.
Left thyroid lobe 5.4 x 2.0 x 1.5 cm.
Thyroid isthmus 2 mm thick.
Minimally inhomogeneous thyroid echogenicity.
Heterogeneous well circumscribed solid nodule identified in left
thyroid lobe, 3.6 x 2.6 x 1.7 cm.
On the previous exam, this measured 3.7 x 2.7 x 1.7 cm.
Appearance and size are stable since previous exam.
No other thyroid masses, thyroid calcifications, or cysts.
No regional adenopathy.
IMPRESSION: Stable left thyroid nodule.
No interval change since [DATE].

## 2010-03-18 ENCOUNTER — Ambulatory Visit (HOSPITAL_COMMUNITY): Admission: RE | Admit: 2010-03-18 | Discharge: 2010-03-18 | Payer: Self-pay | Admitting: Internal Medicine

## 2010-03-18 IMAGING — CT CT ABD-PELV W/ CM
2 of 5 series · 16 of 46 positions shown, 18 images · IV contrast (agent unspecified)
Comparison: None

CLINICAL DATA: Right flank pain

CT ABDOMEN AND PELVIS WITH CONTRAST
TECHNIQUE: Multidetector CT imaging of the abdomen and pelvis was
performed following the standard protocol during bolus
administration of intravenous contrast.
Contrast: 100 ml [30] IV

[Series 2: abd_pel_with 5.0 b40f · axial · 0.59mm/px · z∈[-440,-86]mm · 13 of 81 slices shown, 15 images]
[im 5/81  soft-tissue]
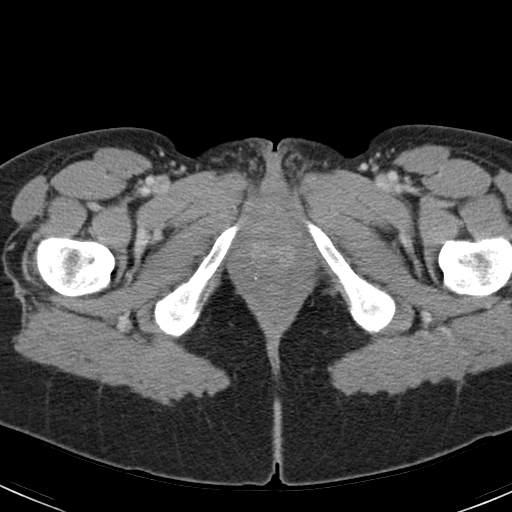
[im 5/81  bone]
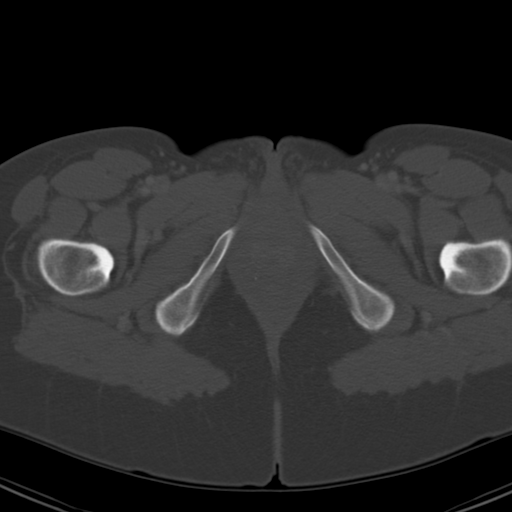
[im 9/81  soft-tissue]
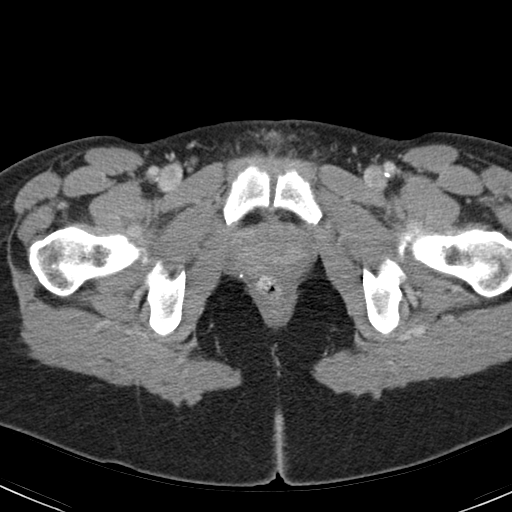
[im 18/81  soft-tissue]
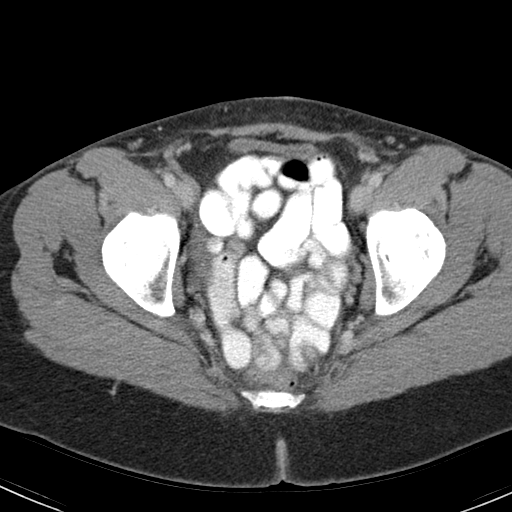
[im 23/81  soft-tissue]
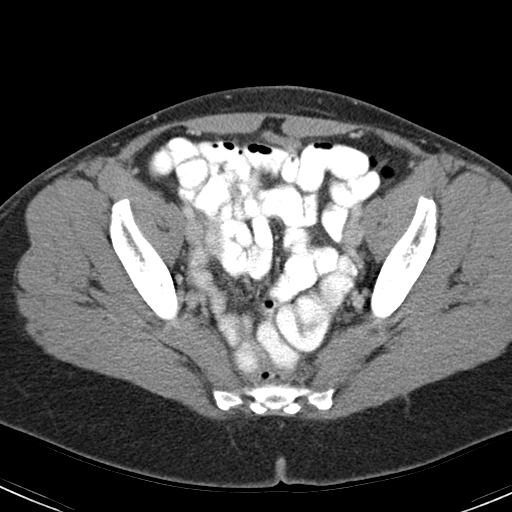
[im 27/81  soft-tissue]
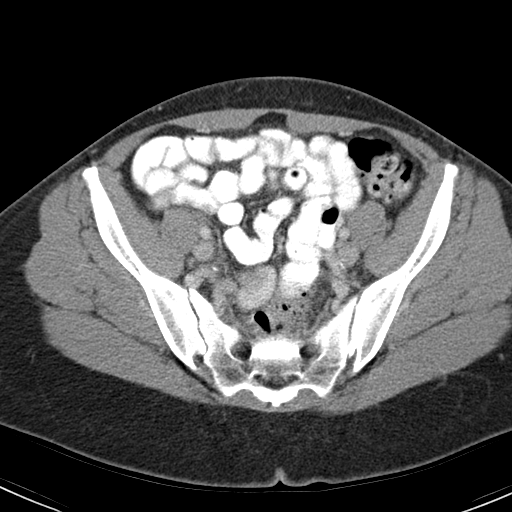
[im 36/81  soft-tissue]
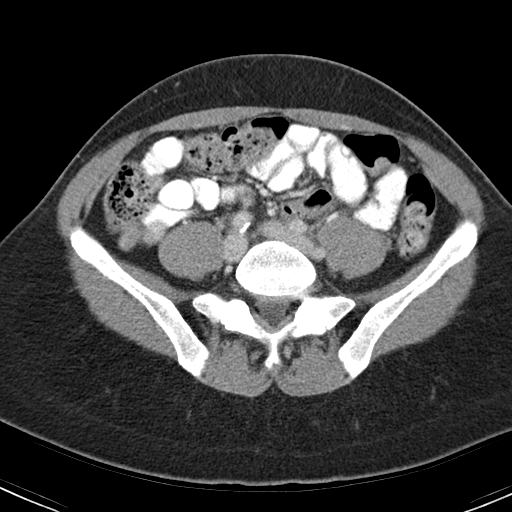
[im 41/81  soft-tissue]
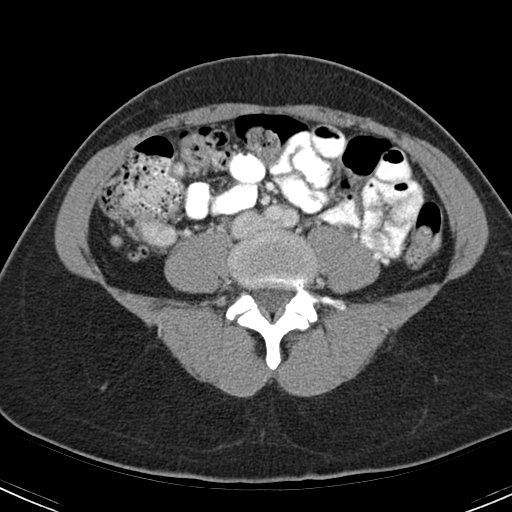
[im 45/81  soft-tissue]
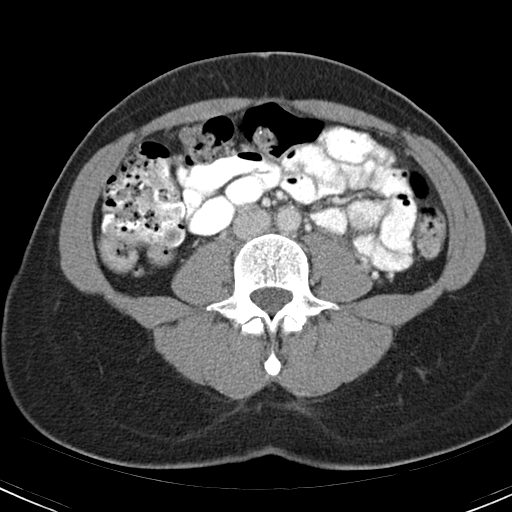
[im 54/81  soft-tissue]
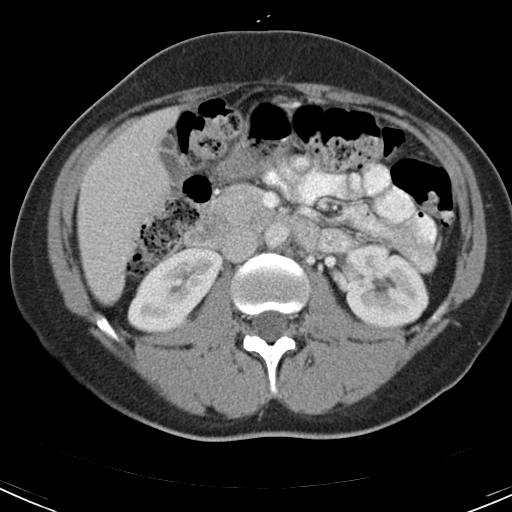
[im 54/81  bone]
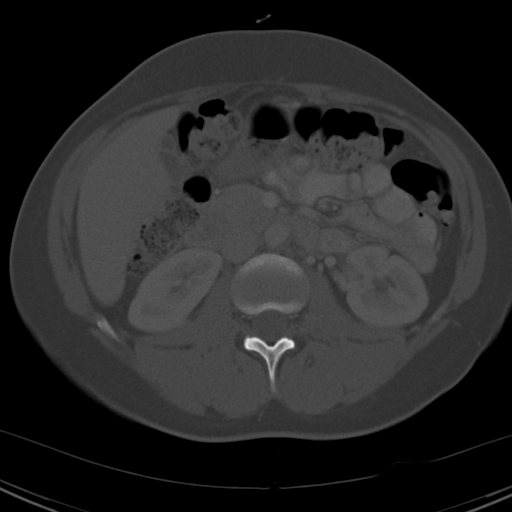
[im 58/81  soft-tissue]
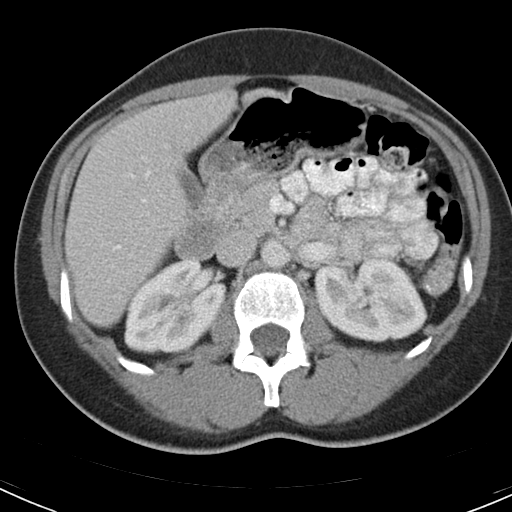
[im 63/81  soft-tissue]
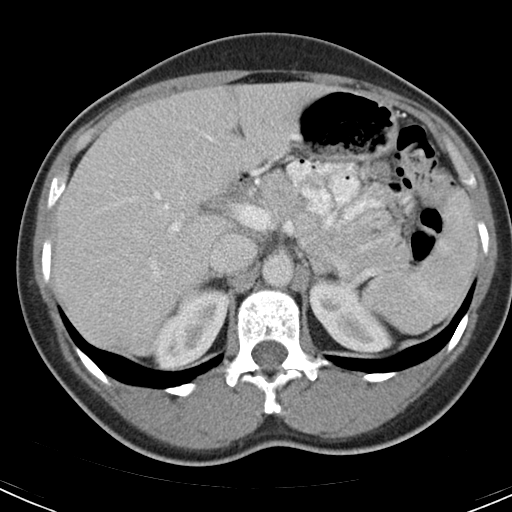
[im 72/81  soft-tissue]
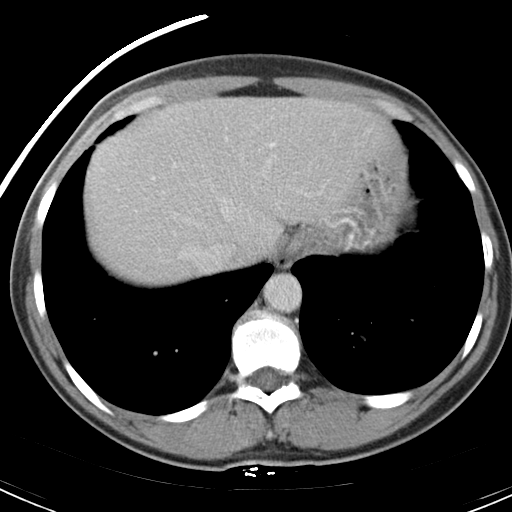
[im 76/81  soft-tissue]
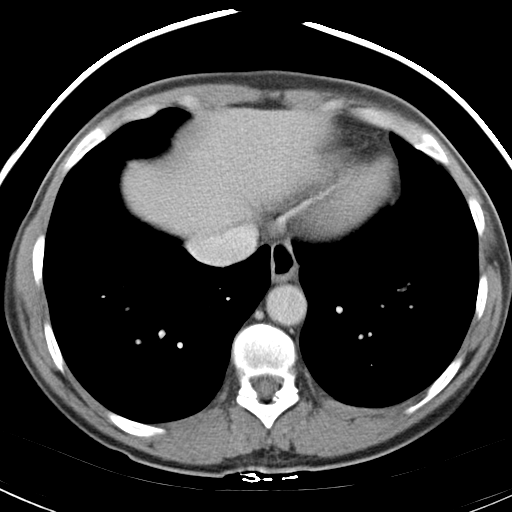

[Series 4: abd_pel_with 3.0 spo cor · coronal · 0.59mm/px · 3 of 71 slices shown]
[im 24/71  soft-tissue]
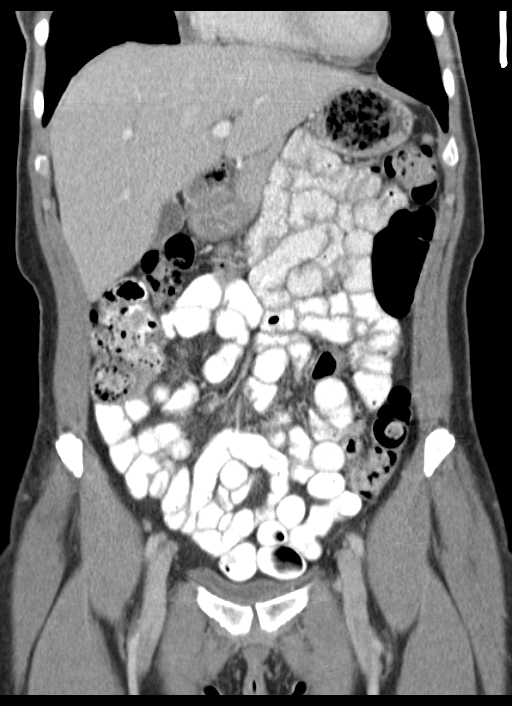
[im 32/71  soft-tissue]
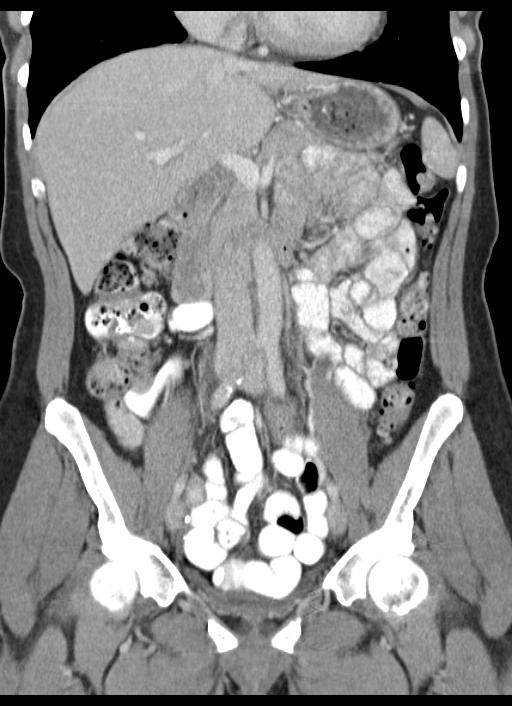
[im 39/71  soft-tissue]
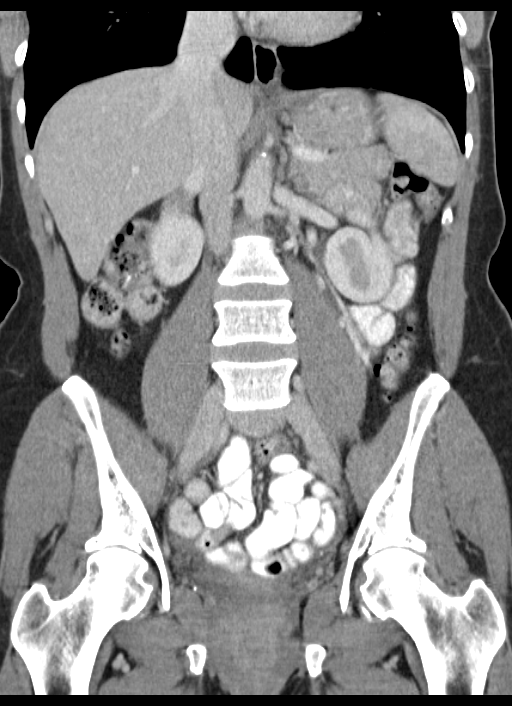

[16 of 46 positions shown; findings below may reference images not displayed]

FINDINGS: Visualized lung bases clear.

Unremarkable liver, nondistended gallbladder, spleen, adrenal
glands, kidneys, pancreas, small bowel.  Normal appendix.  The
colon is nondilated.  Urinary bladder incompletely distended.
Bilateral pelvic phleboliths.  No ascites.  No free air.  Portal
vein patent.  Minimal atheromatous plaque in the aorta.  No
adenopathy.  Normal bilateral renal excretion on delayed scans.
IMPRESSION: 1.  No acute process.  Normal appendix.

## 2010-03-21 ENCOUNTER — Ambulatory Visit (HOSPITAL_COMMUNITY): Admission: RE | Admit: 2010-03-21 | Discharge: 2010-03-21 | Payer: Self-pay | Admitting: Internal Medicine

## 2010-03-21 IMAGING — US US ABDOMEN COMPLETE
1 series · 14 of 25 positions shown · non-contrast
Comparison: CT abdomen pelvis [DATE]

CLINICAL DATA: Right flank pain.  Right upper quadrant pain.

COMPLETE ABDOMINAL ULTRASOUND

[Series 1: us abdomen complete · 0.30mm/px · 14 of 74 slices shown]
[im 1/74]
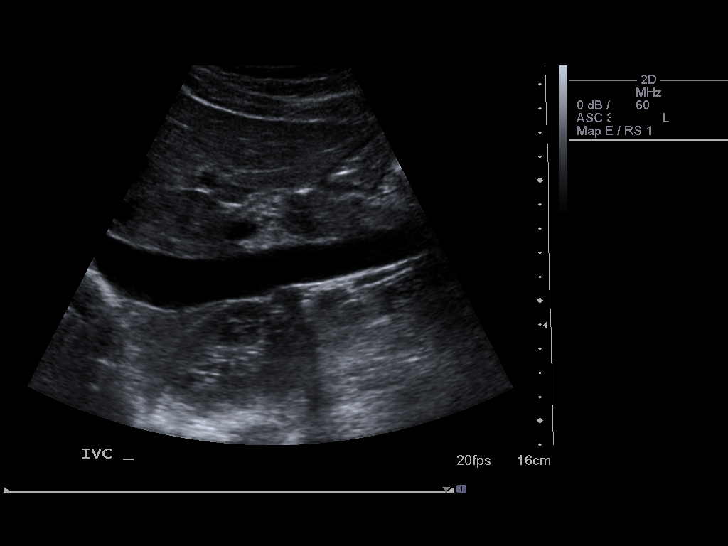
[im 7/74]
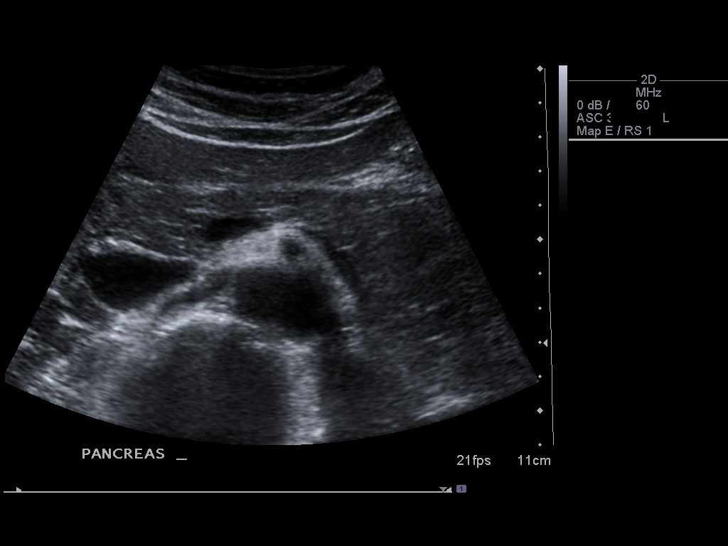
[im 13/74]
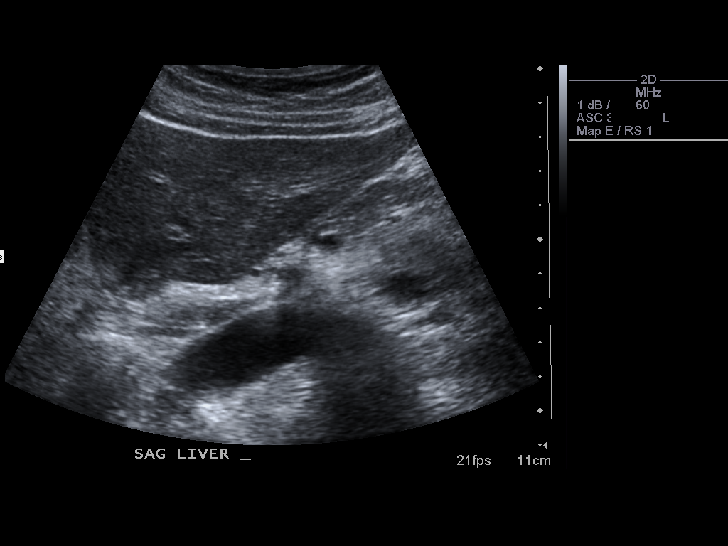
[im 19/74]
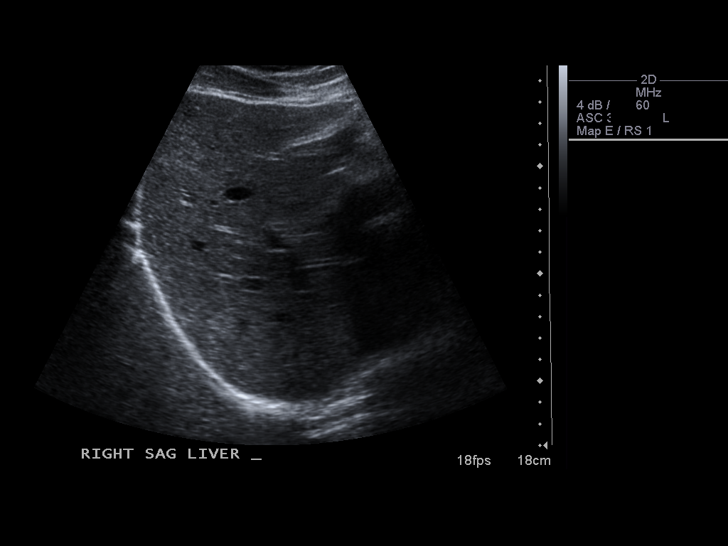
[im 25/74]
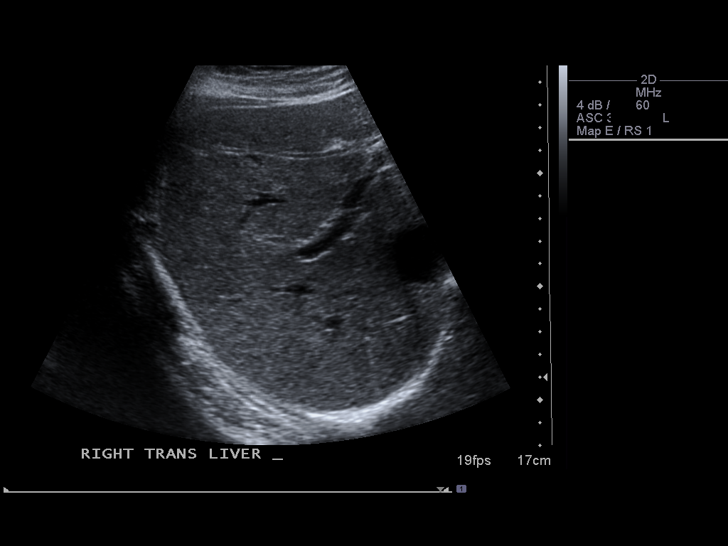
[im 28/74]
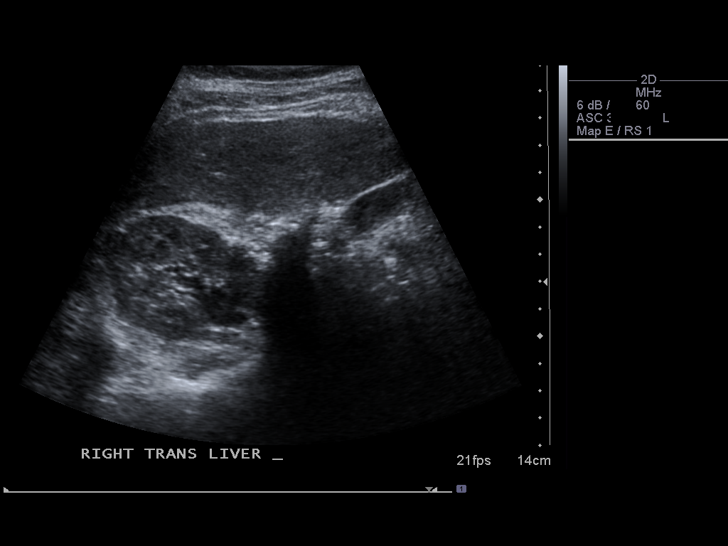
[im 34/74]
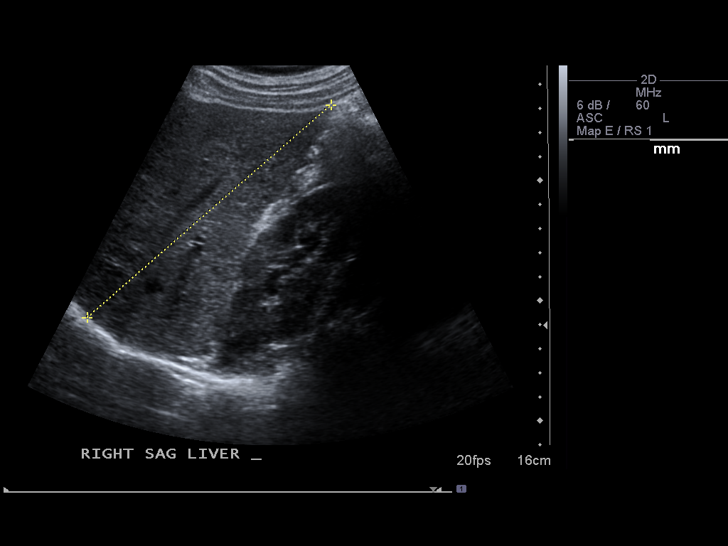
[im 40/74]
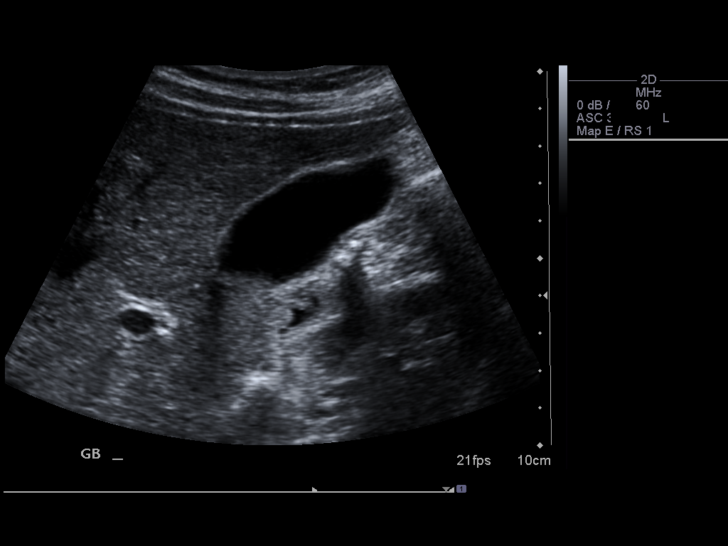
[im 46/74]
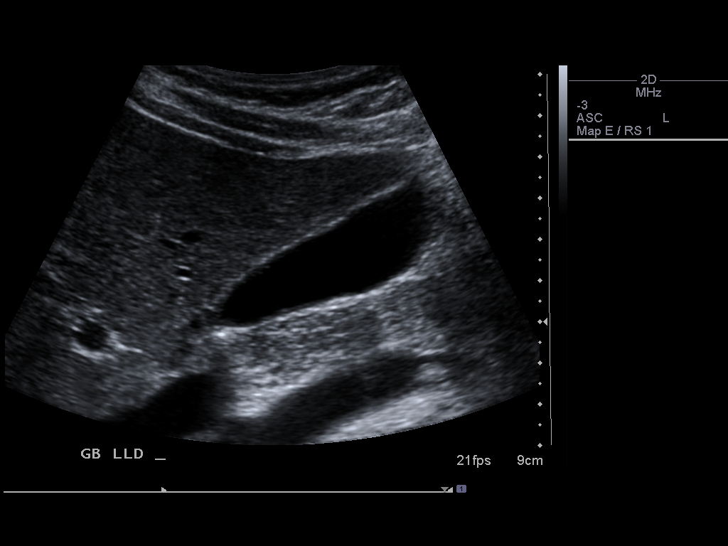
[im 49/74]
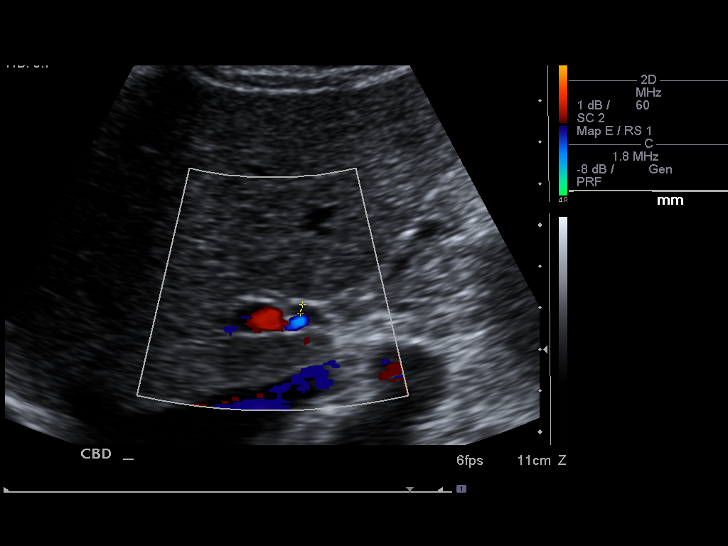
[im 55/74]
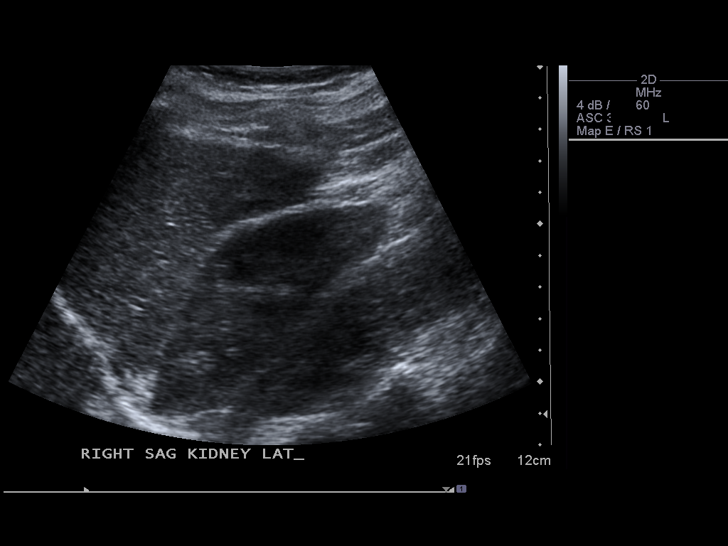
[im 61/74]
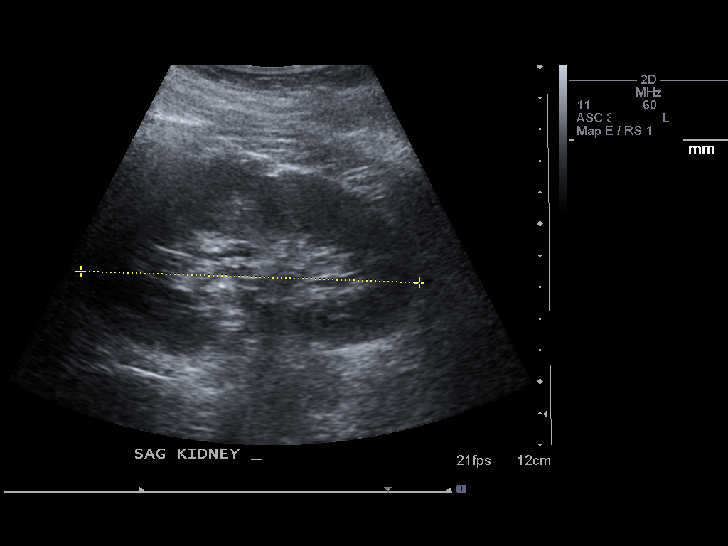
[im 67/74]
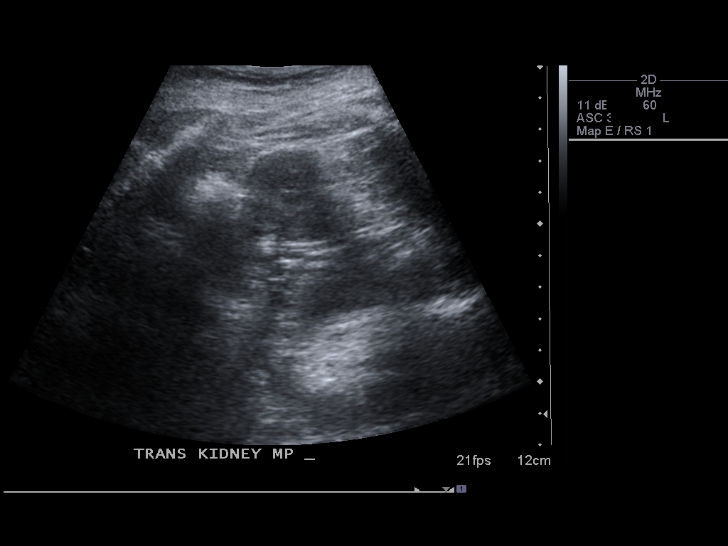
[im 74/74]
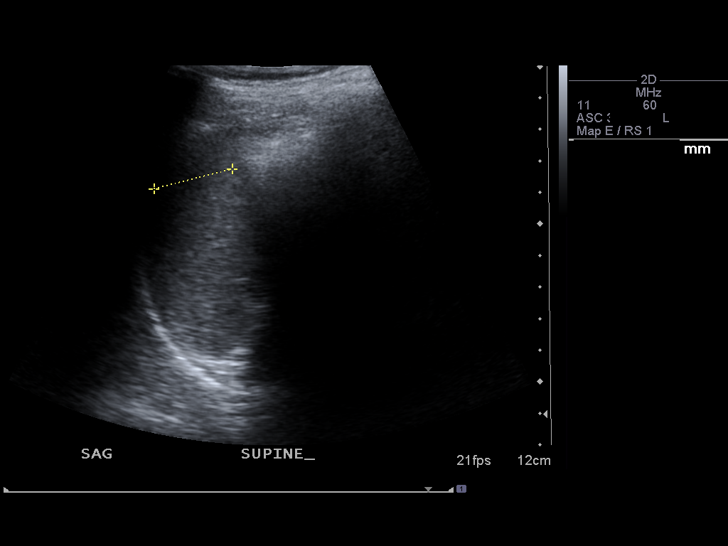

[14 of 25 positions shown; findings below may reference images not displayed]

FINDINGS: Gallbladder:  Negative.

Common bile duct:  Measures 2 mm, within normal limits.

Liver:  Negative.

IVC:  Visualized.

Pancreas:  Negative.

Spleen:  Measures 4.0 cm, negative.

Right Kidney:  Measures 10.1 cm, negative.

Left Kidney:  Measures 10.7 cm, negative.

Abdominal aorta:  No aneurysm identified.
IMPRESSION: No acute findings.

## 2010-03-31 ENCOUNTER — Encounter (INDEPENDENT_AMBULATORY_CARE_PROVIDER_SITE_OTHER): Payer: Self-pay | Admitting: *Deleted

## 2010-05-05 ENCOUNTER — Ambulatory Visit (HOSPITAL_COMMUNITY): Admission: RE | Admit: 2010-05-05 | Discharge: 2010-05-05 | Payer: Self-pay | Admitting: Internal Medicine

## 2010-05-05 IMAGING — MG MM DIGITAL SCREENING
4 series · 4 of 4 positions shown · non-contrast
Comparison: none

DG SCREEN MAMMOGRAM BILATERAL
Bilateral CC and MLO view(s) were taken.

DIGITAL SCREENING MAMMOGRAM WITH CAD:
The breast tissue is heterogeneously dense.  No masses or malignant type calcifications are 
identified.  Compared with prior studies.
Images were processed with CAD.

[L CC]
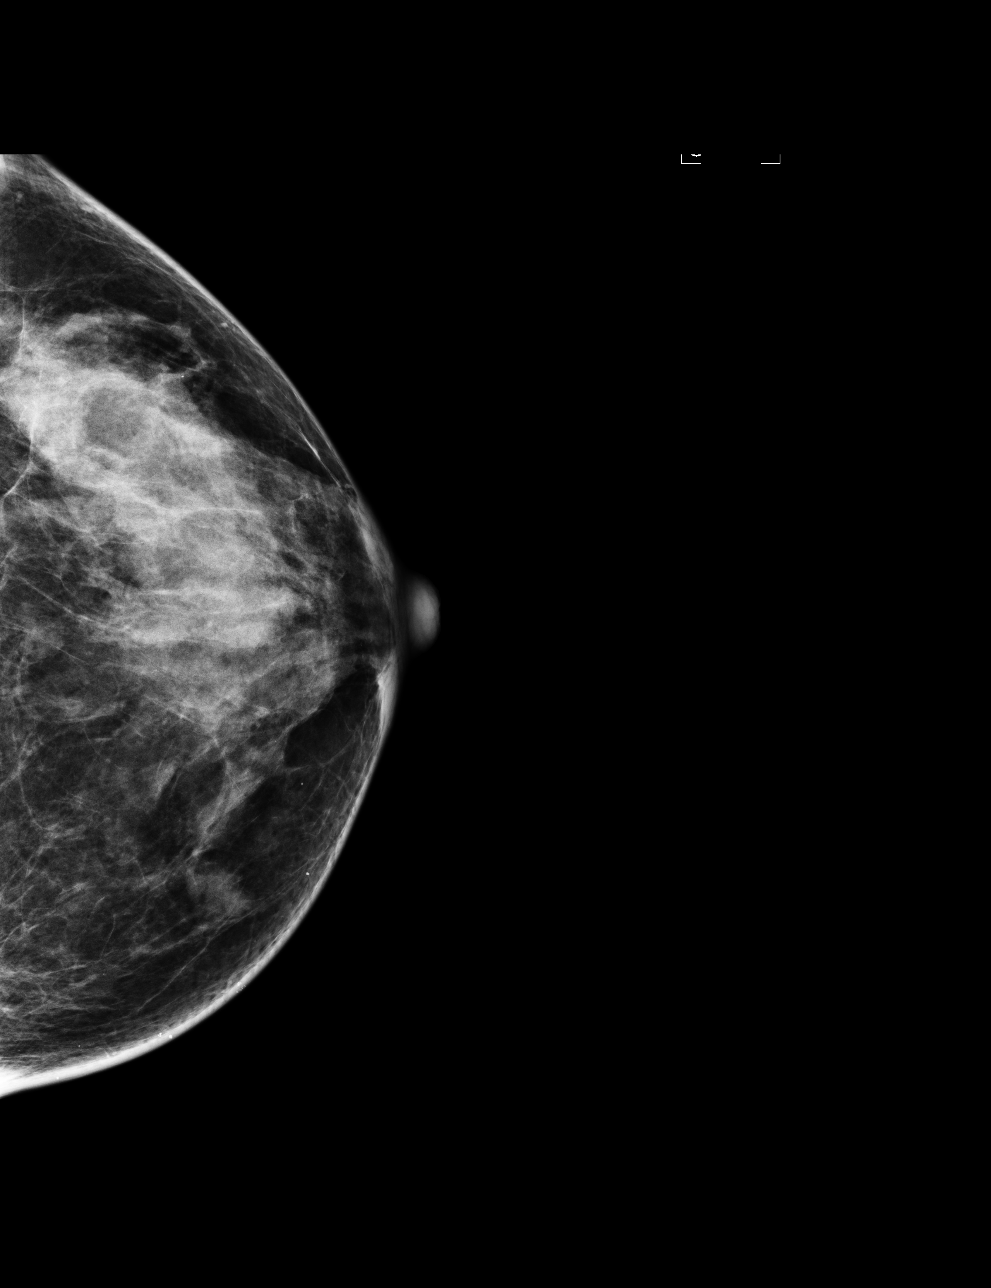

[L MLO]
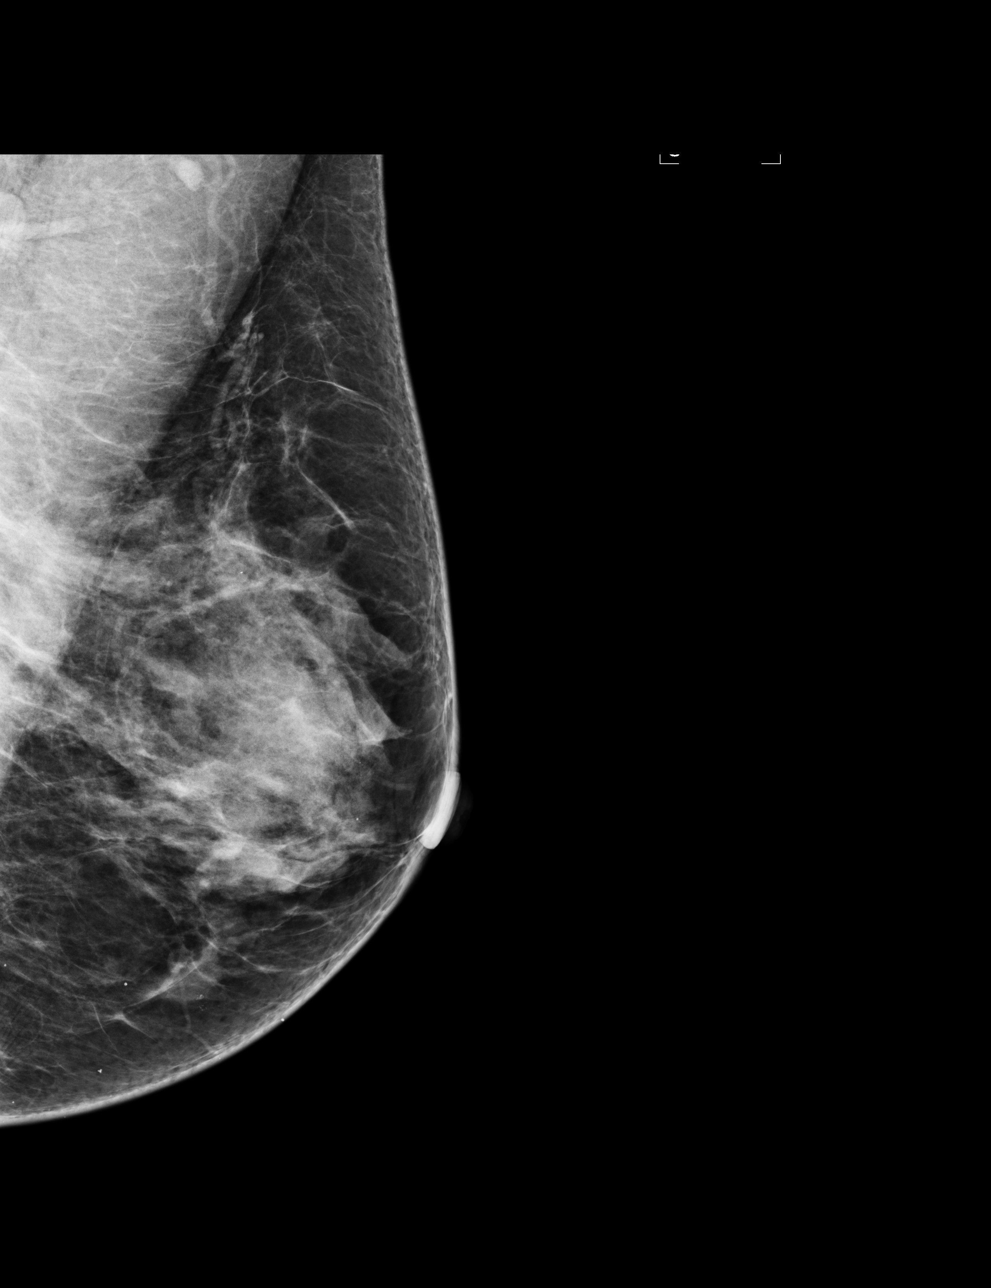

[R CC]
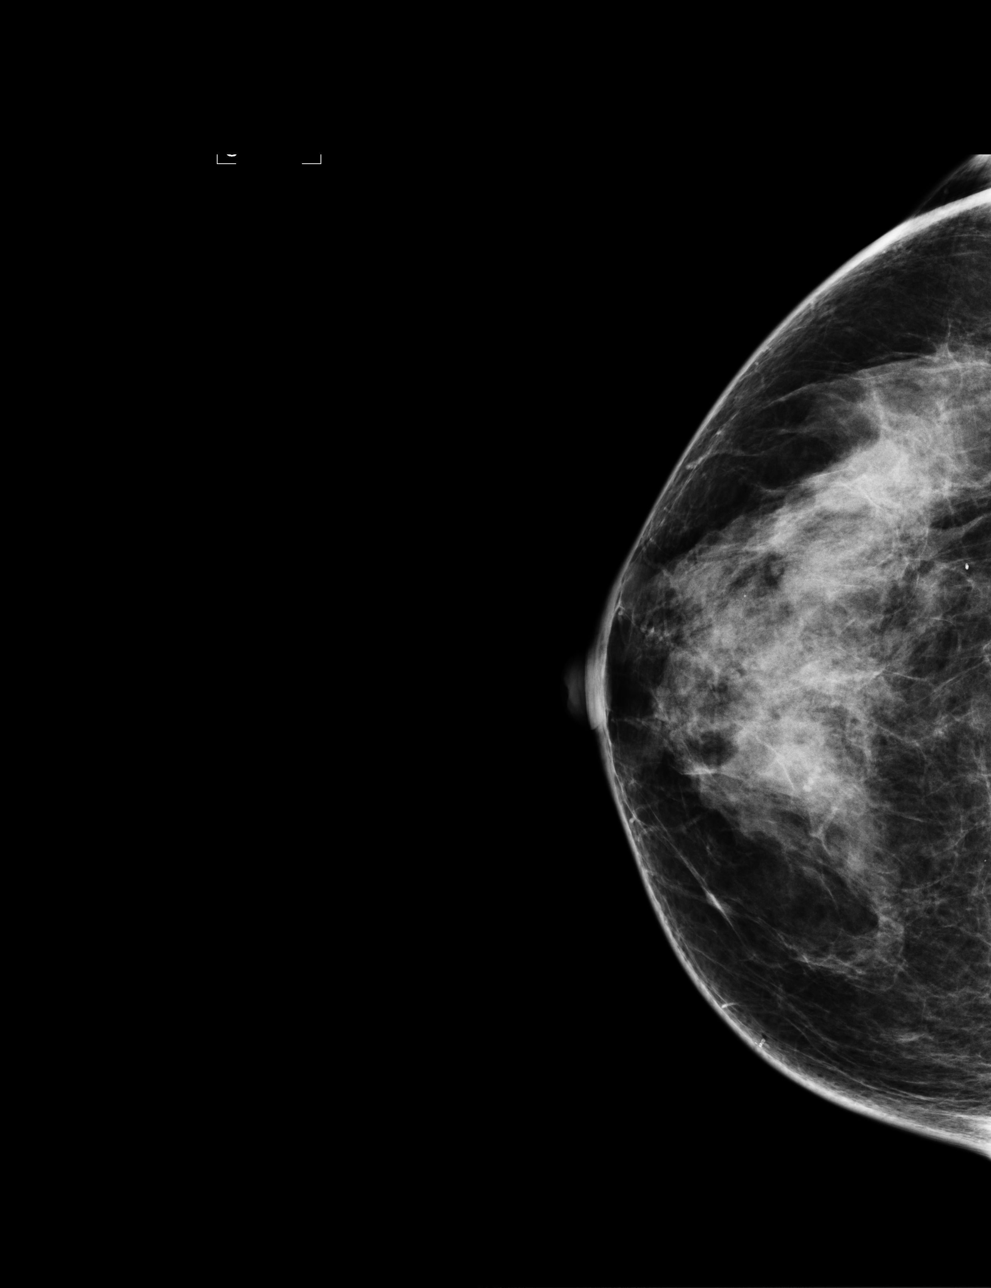

[R MLO]
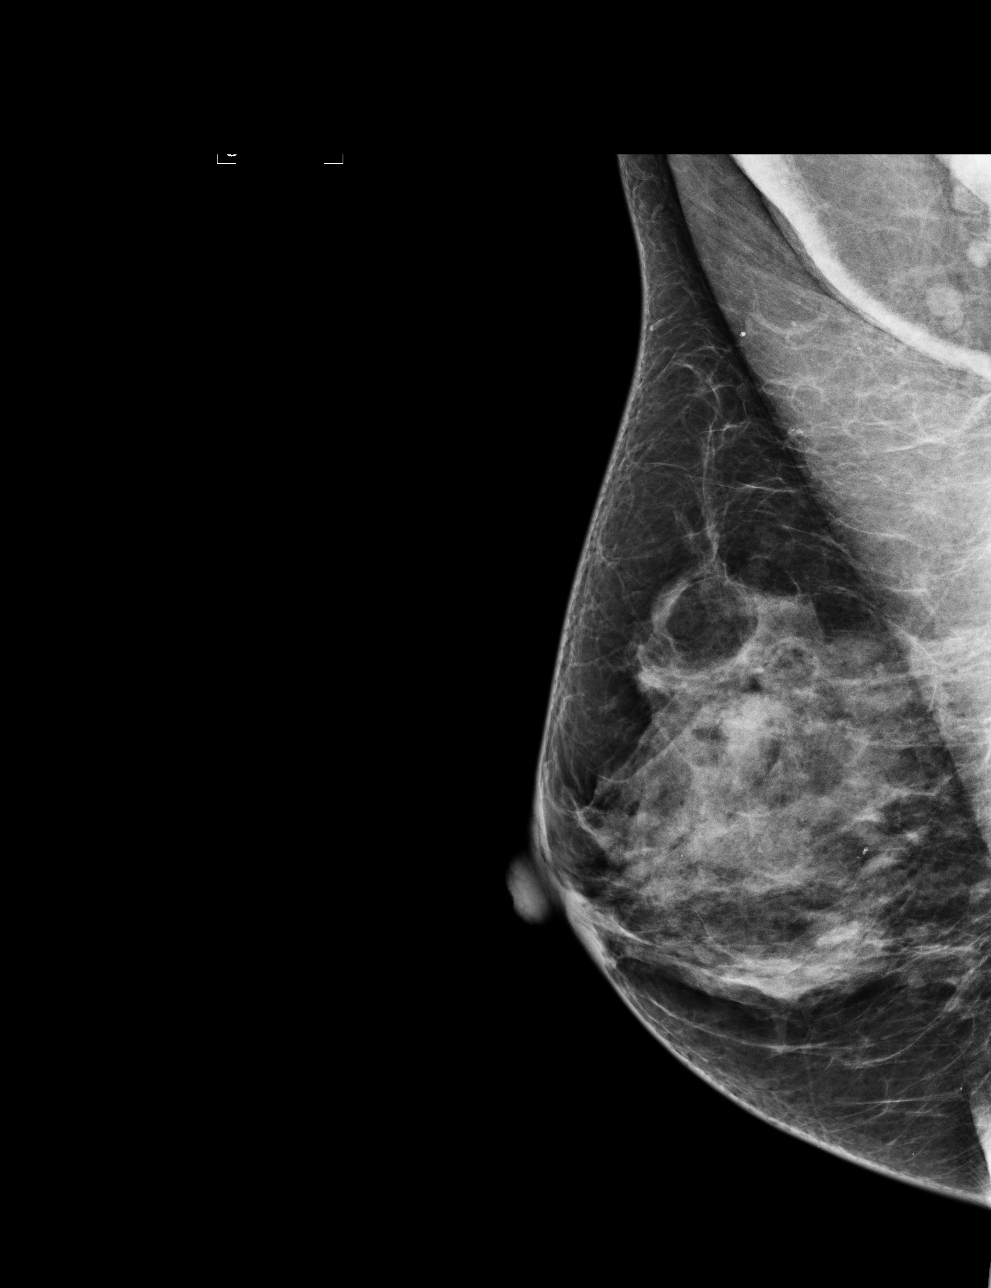

[4 of 4 positions shown; findings below may reference images not displayed]

IMPRESSION: No specific mammographic evidence of malignancy.  Next screening mammogram is recommended in one 
year.

A result letter of this screening mammogram will be mailed directly to the patient.

ASSESSMENT: Negative - BI-RADS 1

Screening mammogram in 1 year.
,

## 2010-10-04 NOTE — Letter (Signed)
Summary: Unable to Reach, Consult Scheduled  [facility]  [address]   [place], [place] [address]   Phone: [phone]  Fax: [phone]    03/31/2010  Diane Parker [address] [place], [place]  [address] 03/01/1956   Dear [name],   We have been unable to reach you by phone.  Please contact our office with an updated phone number.  At the recommendation of DR [facility] we have been asked to schedule you a consult with DR Jena Gauss for ABDOMINAL PAIN AND RECTAL BLEEDING.    Please call our office at [phone].     Thank you,    Diana Eves  [facility] R. Roetta Sessions, M.D.    Jonette Eva, M.D. Lorenza Burton, FNP-BC    Tana Coast, PA-C Phone: [phone]    Fax: [phone]

## 2011-01-17 NOTE — Op Note (Signed)
NAME:  Diane Parker, Diane Parker              ACCOUNT NO.:  [id number]   MEDICAL RECORD NO.:  [id number]          PATIENT TYPE:  AMB   LOCATION:  DAY                           FACILITY:  [facility]   PHYSICIAN:  R. Roetta Sessions, M.D. DATE OF BIRTH:  1956/04/22   DATE OF PROCEDURE:  11/08/2007  DATE OF DISCHARGE:                                PROCEDURE NOTE   PROCEDURE:  Colonoscopy with biopsy.   ENDOSCOPIST:  Jonathon Bellows, M.D.   INDICATIONS FOR PROCEDURE:  A 55 year old lady sent over for colorectal  cancer screening.  She is devoid of any lower GI tract symptoms.  She  has never had her colon imaged.  There is no family history of  colorectal neoplasia.  Colonoscopy is now being done.  This approach has  discussed with the patient at length.  Potential risks, benefits and  alternatives have been reviewed and questions answered; she is  agreeable.  See the documentation in the medical record.   PROCEDURE NOTE:  O2 saturation, blood pressure, pulse and respirations  were monitored throughout the entire procedure.   CONSCIOUS SEDATION:  Versed 4 mg IV, Demerol 75 mg IV in divided doses.   INSTRUMENT:  Pentax video chip system.   DIGITAL RECTAL:  Exam revealed no abnormalities.   ENDOSCOPIC FINDINGS:  The prep was adequate.   COLON:  The colonic mucosa was surveyed from the rectosigmoid junction  through the left, transverse and right colon to area of the appendical  orifice, ileocecal valve and cecum.  These structures were well seen and  photographed for the record.  From this level, the scope was slowly  withdrawn.  All previously mentioned mucosal surfaces were again seen.  The patient was noted to have left-sided transverse diverticula.  There  was a 4-mm polyp just distal to the ileocecal valve which was cold-  biopsied/removed.  There was a cluster of hyperplastic-appearing  mammillation of 1-2 mm in the rectosigmoid junction; 1 was biopsied.  The remainder of the colonic mucosa  appeared normal.  Scope was pulled  down to the rectum and a thorough examination of the rectal mucosa  including a retroflex view of the anal verge demonstrated no  abnormalities.  The patient tolerated the procedure well and was  reactive after endoscopy.   IMPRESSION:  1. Mammillation, rectosigmoid junction/hyperplastic-appearing polypoid      mucosa, 1 of these areas biopsied.  Otherwise, normal rectum.  2. Left-sided transverse diverticula.  3. Diminutive polyp just distal to the ileocecal valve, cold-      biopsied/removed.   RECOMMENDATIONS:  1. Diverticulosis and polyp literature provided to Ms. Marks.  2. Follow up on pathology.  3. Further recommendations to follow.      Jonathon Bellows, M.D.  Electronically Signed     RMR/MEDQ  D:  11/08/2007  T:  11/09/2007  Job:  [id number]

## 2011-01-20 NOTE — Consult Note (Signed)
NAME:  Diane Parker, Diane Parker              ACCOUNT NO.:  [id number]   MEDICAL RECORD NO.:  [id number]           PATIENT TYPE:   LOCATION:                                FACILITY:  [facility]   PHYSICIAN:  R. Roetta Sessions, M.D. DATE OF BIRTH:  Apr 17, 1956   DATE OF CONSULTATION:  10/09/2006  DATE OF DISCHARGE:                                 CONSULTATION   REASON FOR CONSULTATION:  Indigestion.   HISTORY OF PRESENT ILLNESS:  Diane Parker is a delightful 55 year old  African/American female, sent over at the courtesy of Dr. Madelin Rear.  [place], to further evaluate a several-month history of what she describes  as severe indigestion, pointing to her retrosternal area, after she eats  certain foods.  She also feels that there is something stuck, pointing  to her breast bone as well.  Apparently she was started on Carafate and  Nexium recently, which was associated with pretty much abolishment of  her indigestion symptoms.  She still has a sensation that something is  stuck behind her throat, although she related an esophageal dysphagia to  liquids, pills and solid food.  She describes undergoing an barium pill  esophagogram with a GS CT, neither of which revealed any significant  pathology.  (I do not have those reports for review at this time.)   Diane Parker really does not give a history of pill-induced injury.  She  does take potassium along with other medications, but loves to drink  water and drinks plenty of water and does not lie down just after  ingesting any of her medications.  She has not had any abdominal pain,  melena or hematochezia.  She has never had her lower GI tract evaluated.  There has been no weight loss.  She takes an Excedrin now and then for  various aches and pains.  There is no history of peptic ulcer disease or  other GI disorders.  She does not use tobacco.  She rarely has an  alcohol beverage socially.   PAST MEDICAL HISTORY:  Significant for hypertension.   PAST  SURGICAL HISTORY:  1. A tubal ligation.  2. A partial hysterectomy.   CURRENT MEDICATIONS:  1. Carafate suspension, one gram q.i.d.  2. A multivitamin daily.  3. Potassium gluconate, one tab daily.  4. Tylenol arthritis p.r.n.  5. Excedrin tension headache p.r.n.  6. Estradiol 1 mg daily p.r.n.  7. Nexium 40 mg daily p.r.n.  8. Atenolol/hydrochlorothiazide 50/25 mg daily.   ALLERGIES:  No known drug allergies.   FAMILY HISTORY:  Negative for chronic GI or liver disease.  Father died  with a myocardial infarction at age 82.  Mother died with an aneurysm at  age 26.  No history of chronic GI or liver illness.   SOCIAL HISTORY:  The patient is single.  She has two children.  She  works for [employer] in [place].  She drives a fork lift.  Alcohol and tobacco history is outlined above.   REVIEW OF SYSTEMS:  She really has not had nay chest pain or dyspnea on  exertion.  Feels that  she has gained a little bit of weight recently.  No fever or chills.  Otherwise as in the history of present illness.   PHYSICAL EXAMINATION:  GENERAL:  A pleasant 55 year old lady, resting  comfortably.  VITAL SIGNS:  Weight 163 pounds, height 5 feet 3-1/2 inches, temperature  98.2 degrees, blood pressure 118/82, pulse 82.  SKIN:  Warm and dry.  HEENT:  No scleral icterus.  Conjunctivae pink.  CHEST:  Lungs clear to auscultation.  HEART:  A regular rate and rhythm without murmur, gallop or rub.  ABDOMEN:  Non-distended.  Positive bowel sounds.  Soft, nontender,  without appreciable mass or organomegaly.  EXTREMITIES:  No edema.   IMPRESSION:  Diane Parker is a pleasant 55 year old lady with symptoms  consistent with gastroesophageal reflux disease which had been squelched  effectively with Nexium and Carafate recently.  She has continue to have  symptoms reminiscent of a foreign body sensation behind her breast bone.  She really denies any esophageal dysphagia.  I get a sense that there   may be a globus component to her persisting symptoms.  Would wonder  about pill-induced injury, but the history is really not consistent with  that entity.  She has never had her upper gastrointestinal tract  evaluated.   RECOMMENDATIONS:  I would certainly like to review the chest CT and  barium pill esophagogram as soon as it available.  I have gone ahead and  offered Diane Parker a diagnostic EGD to further evaluate her symptoms.  This approach has been discussed with Diane Parker.  The potential risks,  benefits and alternatives have been reviewed.  Questions answered.  She  is agreeable.  I will make further recommendations in the very near  future.   A separate issue:  I suggested to Diane Parker that at some point later  this year she should consider undergoing an elective colonoscopy for  colorectal cancer screening purposes.   I would like to thank Dr. Artis Delay for letting me see this nice lady  today.      Jonathon Bellows, M.D.  Electronically Signed     RMR/MEDQ  D:  10/09/2006  T:  10/09/2006  Job:  [id number]   cc:   Madelin Rear. Sherwood Gambler, MD  Fax: [phone]

## 2011-01-20 NOTE — Op Note (Signed)
NAME:  Diane Parker, Diane Parker              ACCOUNT NO.:  [id number]   MEDICAL RECORD NO.:  [id number]          PATIENT TYPE:  AMB   LOCATION:  DAY                           FACILITY:  [facility]   PHYSICIAN:  R. Roetta Sessions, M.D. DATE OF BIRTH:  02/07/1956   DATE OF PROCEDURE:  10/25/2006  DATE OF DISCHARGE:                                PROCEDURE NOTE   PROCEDURE:  Diagnostic EGD.   INDICATIONS FOR PROCEDURE:  55 year old lady with gastroesophageal  reflux disease symptoms and prominent symptoms of regurgitation.  She  does complain of some foreign body sensation behind her breastbone.  Chest x-ray and barium pill esophagogram failed to demonstrate any  explanation for those symptoms.  Barium pill readily traversed the  esophagus.  EGD is now being done.  This approach has been discussed  with the patient at length.  The potential risks, benefits, and  alternatives have been reviewed and questions answered.  She is  agreeable.  Please see the documentation in the medical record.   PROCEDURE:  O2 saturations, blood pressure, pulse, and respirations were  monitored throughout the entire procedure.  Conscious sedation was with  Versed 3 mg IV, Demerol 75 mg IV in divided doses.  Cetacaine spray for  topical oropharyngeal anesthesia.  The instrument used was the Pentax  video chip system.   FINDINGS:  Examination of the tubular esophagus revealed no mucosal  abnormalities.  The EG junction was easily traversed.   Stomach:  The gastric cavity was empty and insufflated well with air.  A  thorough examination of the gastric mucosa on retroflex of the proximal  stomach and esophagogastric junction demonstrated no abnormalities.  The  pylorus was patent and easily traversed.  Examination of the bulb and  second portion revealed no abnormalities.   THERAPEUTIC/DIAGNOSTIC MANEUVERS:  None.   The patient tolerated the procedure well and was reactive in endoscopy.   IMPRESSION:  Normal esophagus,  stomach, first and second portions of the  duodenum.   RECOMMENDATIONS:  1. A 10 pound weight loss over the next three months.  2. Increase Nexium to 40 mg orally twice daily.  3. A followup appointment with [place] in three months.  4. As previously discussed, when she returns in three months we will      plan to set her up for her first screening colonoscopy.      Jonathon Bellows, M.D.  Electronically Signed     RMR/MEDQ  D:  10/25/2006  T:  10/25/2006  Job:  [id number]   cc:   Madelin Rear. Sherwood Gambler, MD  Fax: [phone]

## 2011-04-10 ENCOUNTER — Other Ambulatory Visit (HOSPITAL_COMMUNITY): Payer: Self-pay | Admitting: Internal Medicine

## 2011-04-10 DIAGNOSIS — N63 Unspecified lump in unspecified breast: Secondary | ICD-10-CM

## 2011-04-19 ENCOUNTER — Ambulatory Visit (HOSPITAL_COMMUNITY)
Admission: RE | Admit: 2011-04-19 | Discharge: 2011-04-19 | Disposition: A | Discharge status: Home / Self Care | Payer: BC Managed Care – PPO | Source: Ambulatory Visit | Attending: Internal Medicine | Admitting: Internal Medicine

## 2011-04-19 ENCOUNTER — Other Ambulatory Visit (HOSPITAL_COMMUNITY): Payer: Self-pay | Admitting: Internal Medicine

## 2011-04-19 DIAGNOSIS — N63 Unspecified lump in unspecified breast: Secondary | ICD-10-CM | POA: Insufficient documentation

## 2011-04-19 IMAGING — MG MM DIGITAL DIAGNOSTIC BILAT {[facility]}
5 series · 5 of 5 positions shown · non-contrast
Comparison: Prior exams

CLINICAL DATA: Questioned palpable finding per patient right upper
outer quadrant periareolar area

DIGITAL DIAGNOSTIC BILATERAL MAMMOGRAM WITH CAD AND RIGHT BREAST
ULTRASOUND:

[L CC]
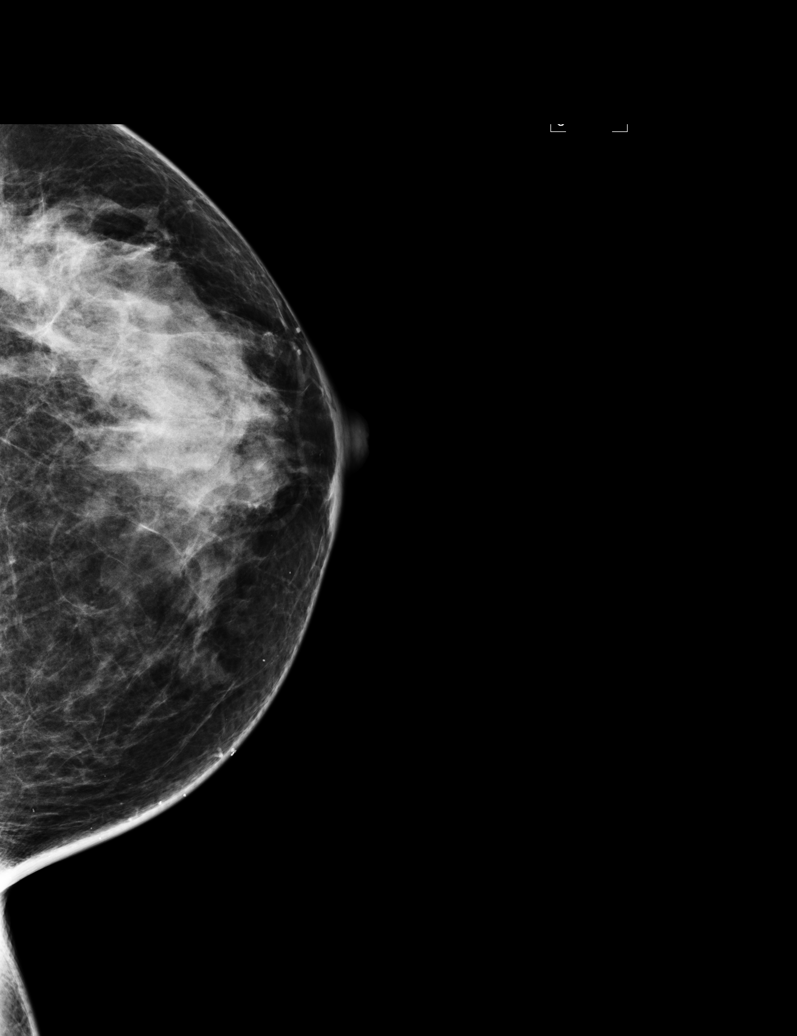

[L MLO]
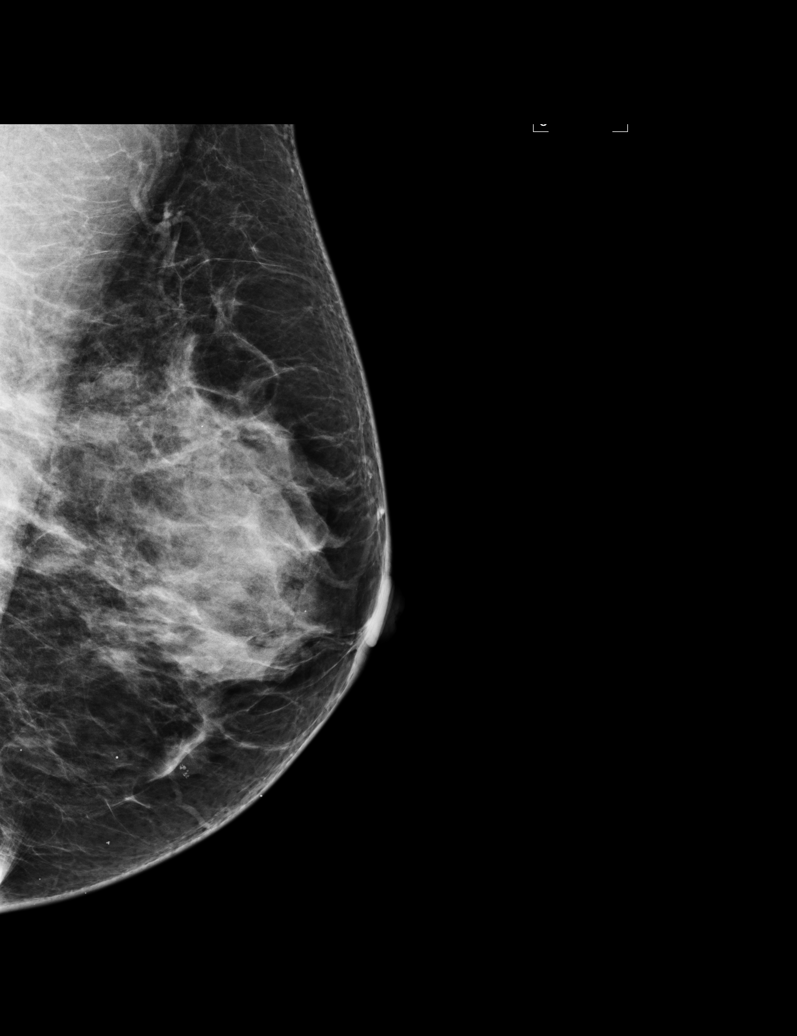

[R CC]
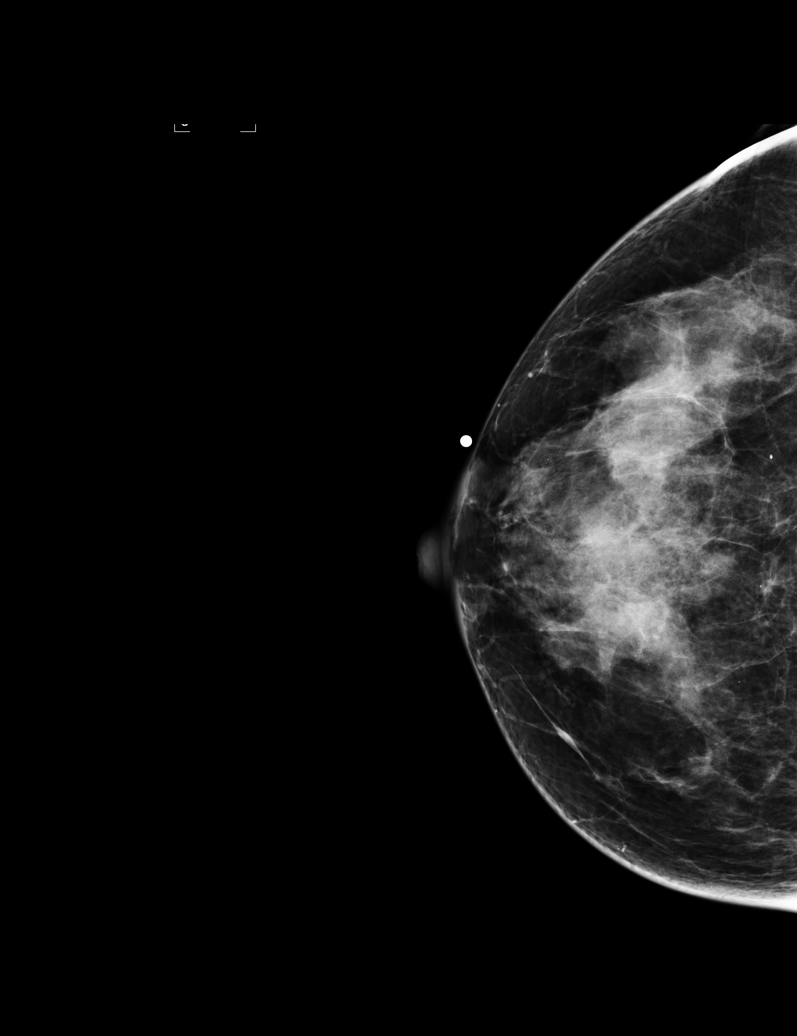

[R MLO]
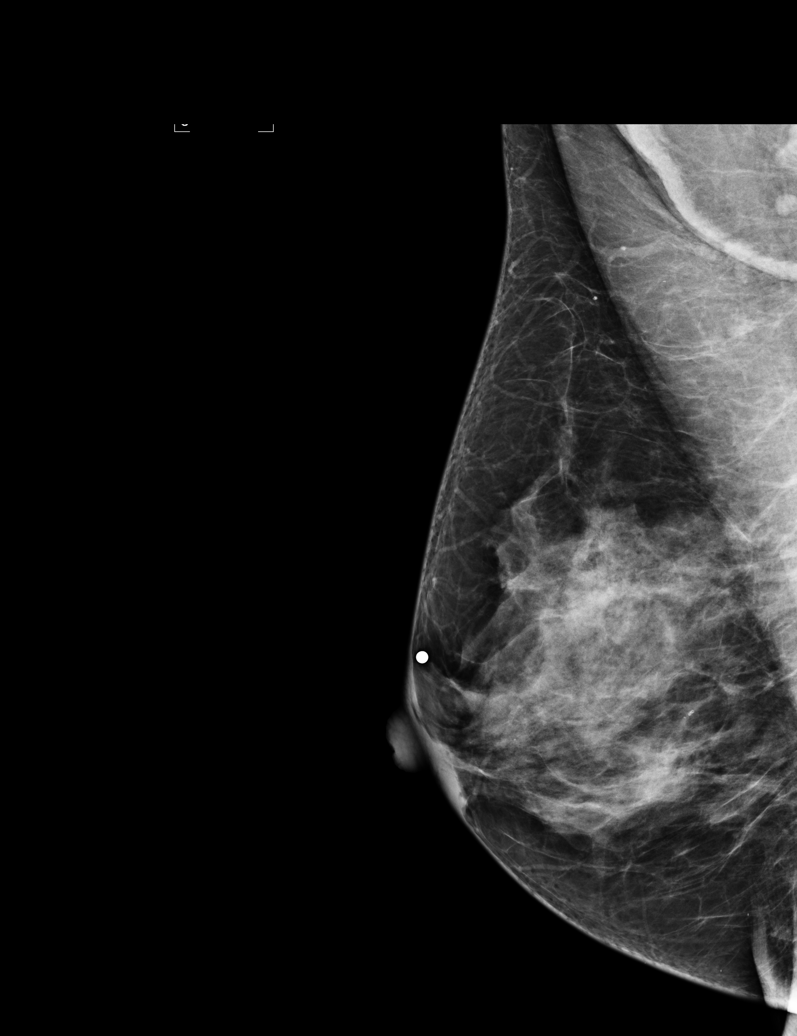

[R TAN]
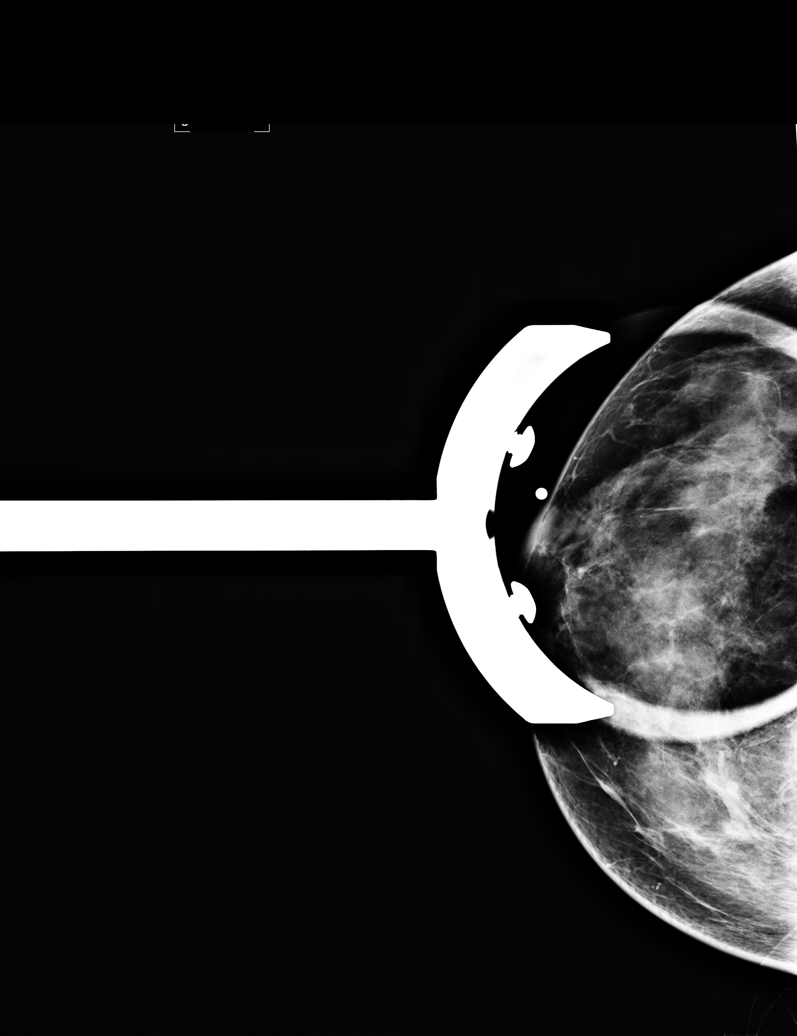

[5 of 5 positions shown; findings below may reference images not displayed]

FINDINGS: The breast parenchyma is heterogeneously dense, which
may limit the sensitivity of mammography.  The area of the
questioned palpable finding in the right periareolar upper outer
quadrant demonstrates stable ill-defined increased density but no
measurable mass like component.  This has an appearance suggestive
of normal-appearing parenchyma.
Mammographic images were processed with CAD.

On physical exam, I palpate mobile fibroglandular type tissue in
the periareolar right breast 10 o'clock location at the site of the
patient's questioned palpable finding.

Ultrasound is performed, showing normal-appearing fibroglandular
tissue in this area.
IMPRESSION: No evidence for malignancy.  Any further management of the
questioned palpable finding should be dictated by clinical
assessment.  Otherwise, screening mammography is recommended in 1
year. Findings and recommendations discussed with the patient and
provided in written form at the time of the exam.

BI-RADS CATEGORY 1:  Negative.

## 2012-05-03 ENCOUNTER — Other Ambulatory Visit (HOSPITAL_COMMUNITY): Payer: Self-pay | Admitting: Internal Medicine

## 2012-05-03 DIAGNOSIS — Z139 Encounter for screening, unspecified: Secondary | ICD-10-CM

## 2012-05-10 ENCOUNTER — Ambulatory Visit (HOSPITAL_COMMUNITY)
Admission: RE | Admit: 2012-05-10 | Discharge: 2012-05-10 | Disposition: A | Discharge status: Home / Self Care | Payer: BC Managed Care – PPO | Source: Ambulatory Visit | Attending: Internal Medicine | Admitting: Internal Medicine

## 2012-05-10 DIAGNOSIS — Z1231 Encounter for screening mammogram for malignant neoplasm of breast: Secondary | ICD-10-CM | POA: Insufficient documentation

## 2012-05-10 DIAGNOSIS — Z139 Encounter for screening, unspecified: Secondary | ICD-10-CM

## 2012-05-10 IMAGING — MG MM DIGITAL SCREENING BILAT
5 series · 5 of 5 positions shown · non-contrast
Comparison: Previous exams.

CLINICAL DATA: Screening.

DIGITAL BILATERAL SCREENING MAMMOGRAM WITH CAD

[L CC]
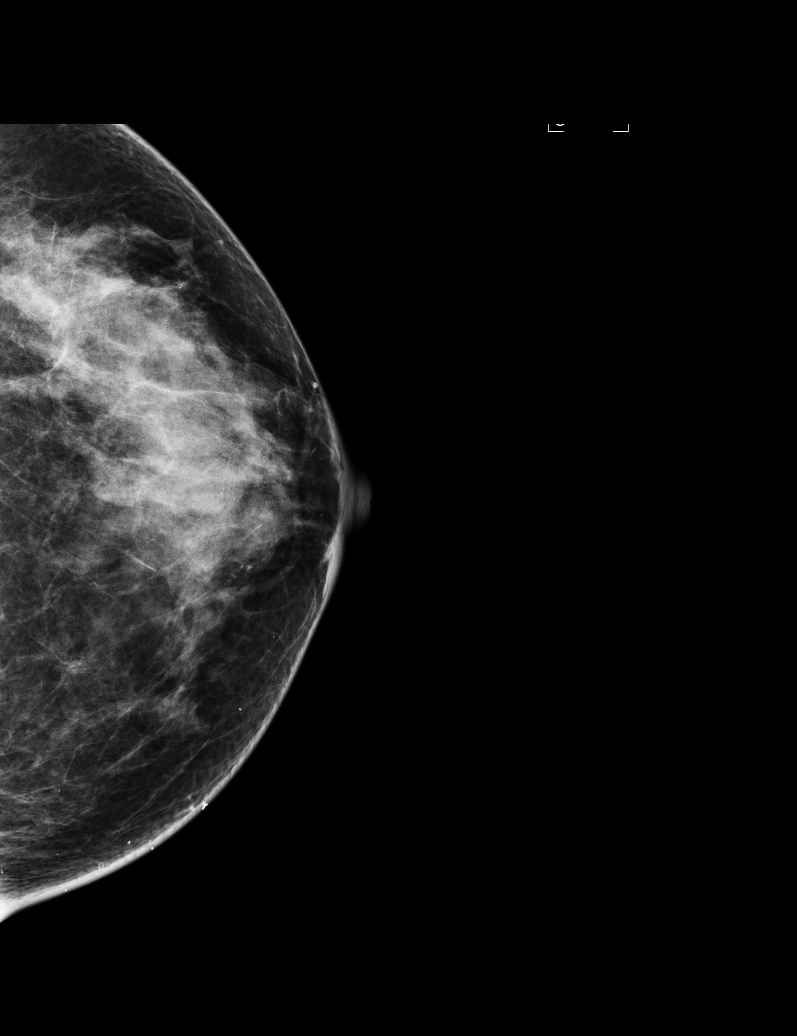

[L MLO]
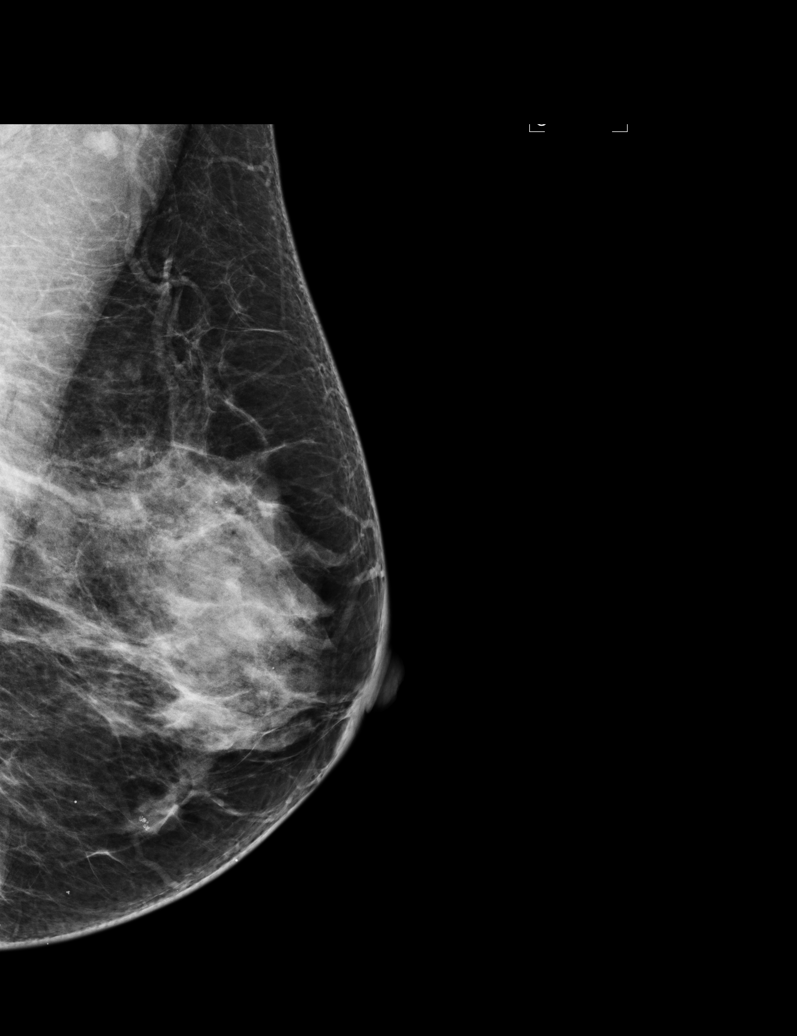

[R CC]
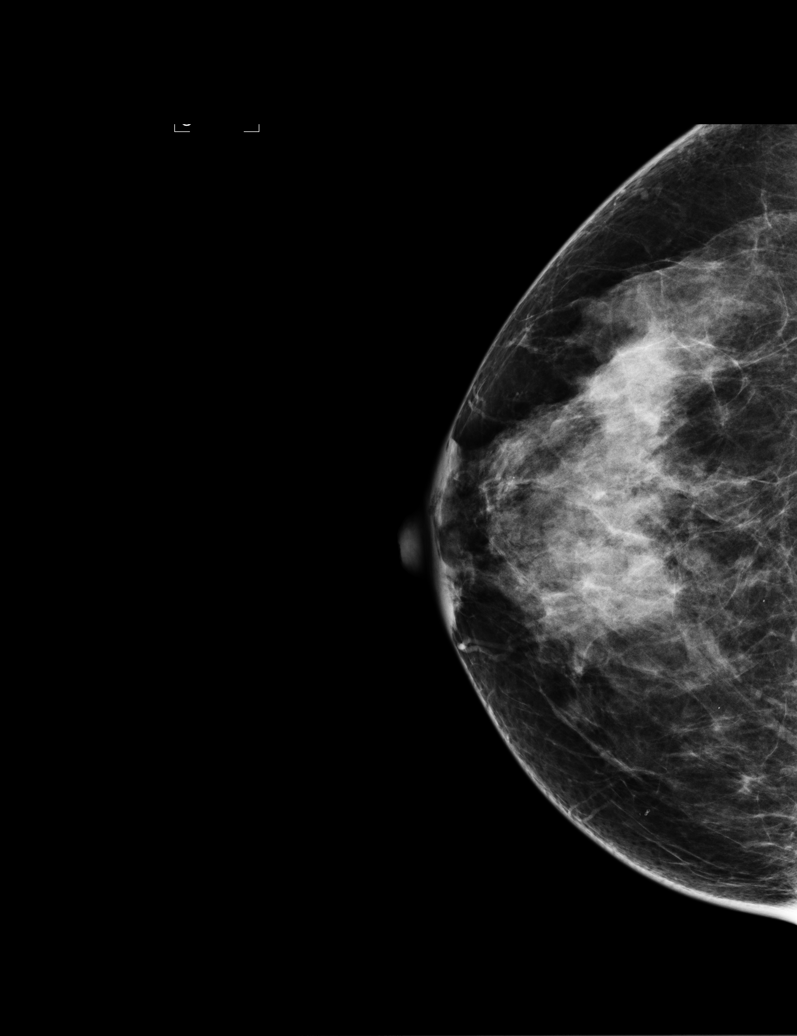

[R MLO (1 of 2)]
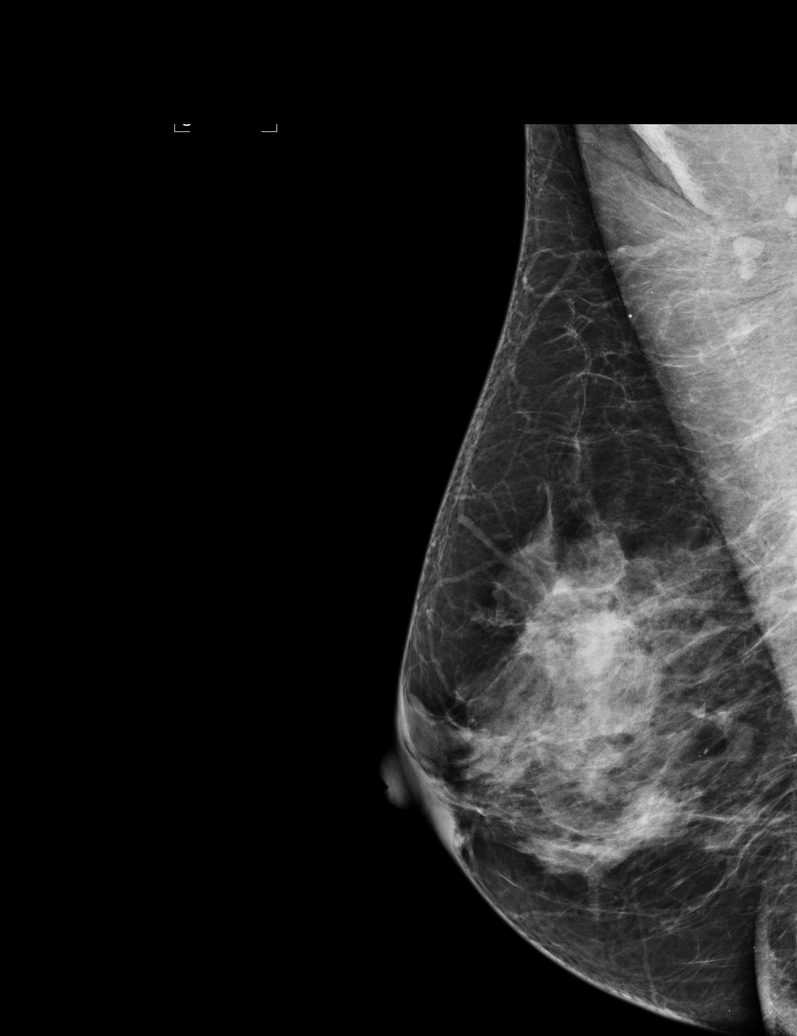

[R MLO (2 of 2)]
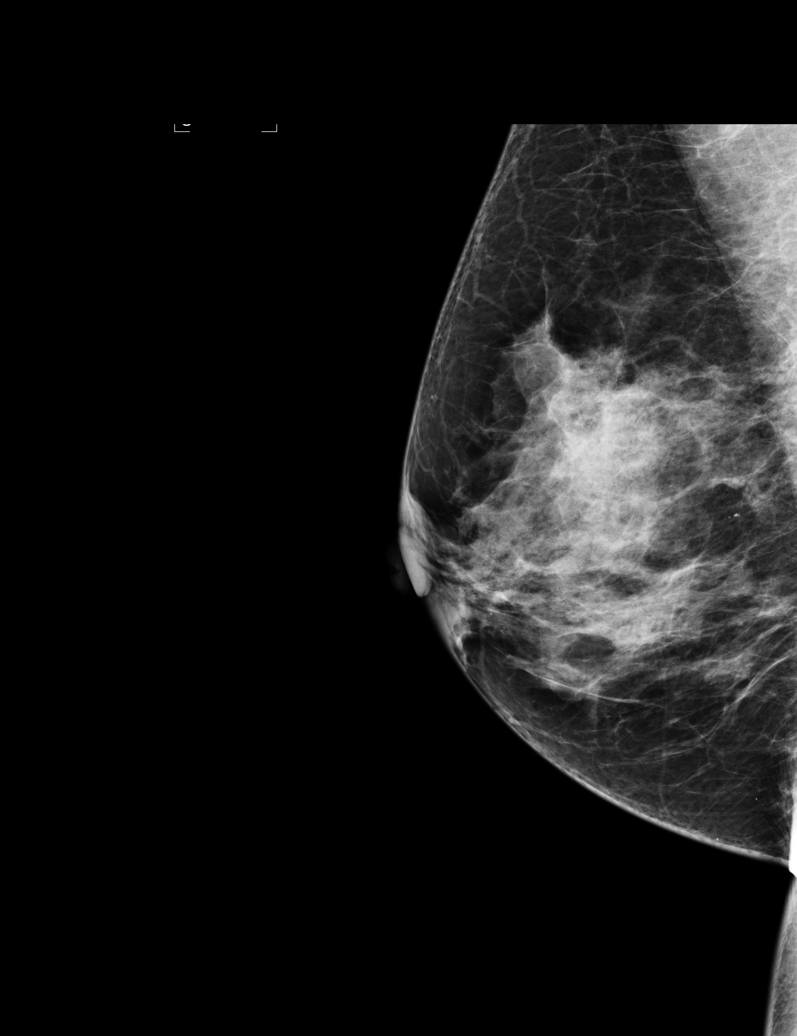

[5 of 5 positions shown; findings below may reference images not displayed]

FINDINGS: There are scattered fibroglandular densities. No
suspicious masses, architectural distortion, or calcifications are
present.

Images were processed with CAD.
IMPRESSION: No mammographic evidence of malignancy.

A result letter of this screening mammogram will be mailed directly
to the patient.

RECOMMENDATION:
Screening mammogram in one year. (Code:[XG])

BI-RADS CATEGORY 1:  Negative.

## 2012-11-19 ENCOUNTER — Telehealth: Payer: Self-pay | Admitting: Adult Health

## 2012-11-19 NOTE — Telephone Encounter (Signed)
Please call this pt about her meds

## 2012-11-20 MED ORDER — ESTRADIOL 2 MG PO TABS: 2.0000 mg | ORAL_TABLET | Freq: Every day | ORAL | Status: DC

## 2012-11-20 NOTE — Telephone Encounter (Signed)
Refilled estradiol 2 mg #90 at [facility]

## 2012-11-25 ENCOUNTER — Telehealth: Payer: Self-pay | Admitting: Adult Health

## 2012-11-25 NOTE — Telephone Encounter (Signed)
I am sorry but I do not know who this pt is. I think was maybe sent to the wrong [facility].

## 2012-11-26 ENCOUNTER — Telehealth: Payer: Self-pay | Admitting: *Deleted

## 2012-11-26 NOTE — Telephone Encounter (Signed)
Patient needs a Rx for Zoloft faxed to [facility]. Pt. Needs ASAP.

## 2012-11-26 NOTE — Telephone Encounter (Signed)
Pt is wanting prescription for Zoloft to [facility], per chart pt taking Zoloft 10 mg, however pt could not verify dosage.

## 2012-11-27 ENCOUNTER — Telehealth: Payer: Self-pay | Admitting: Adult Health

## 2012-11-27 MED ORDER — SERTRALINE HCL 25 MG PO TABS: 25.0000 mg | ORAL_TABLET | Freq: Every day | ORAL | Status: DC

## 2012-11-27 NOTE — Telephone Encounter (Signed)
Left message Zoloft refilled with [facility]

## 2013-03-26 ENCOUNTER — Encounter: Payer: Self-pay | Admitting: Adult Health

## 2013-03-26 ENCOUNTER — Telehealth: Payer: Self-pay | Admitting: Adult Health

## 2013-03-26 ENCOUNTER — Ambulatory Visit (INDEPENDENT_AMBULATORY_CARE_PROVIDER_SITE_OTHER): Payer: BC Managed Care – PPO | Admitting: Adult Health

## 2013-03-26 ENCOUNTER — Ambulatory Visit (HOSPITAL_COMMUNITY)
Admission: RE | Admit: 2013-03-26 | Discharge: 2013-03-26 | Disposition: A | Discharge status: Home / Self Care | Payer: BC Managed Care – PPO | Source: Ambulatory Visit | Attending: Adult Health | Admitting: Adult Health

## 2013-03-26 VITALS — BP 140/84 | Ht 63.5 in | Wt 158.0 lb

## 2013-03-26 DIAGNOSIS — R509 Fever, unspecified: Secondary | ICD-10-CM | POA: Insufficient documentation

## 2013-03-26 DIAGNOSIS — K7689 Other specified diseases of liver: Secondary | ICD-10-CM | POA: Insufficient documentation

## 2013-03-26 DIAGNOSIS — R197 Diarrhea, unspecified: Secondary | ICD-10-CM

## 2013-03-26 DIAGNOSIS — R1032 Left lower quadrant pain: Secondary | ICD-10-CM | POA: Insufficient documentation

## 2013-03-26 DIAGNOSIS — Z1212 Encounter for screening for malignant neoplasm of rectum: Secondary | ICD-10-CM

## 2013-03-26 DIAGNOSIS — Z8719 Personal history of other diseases of the digestive system: Secondary | ICD-10-CM

## 2013-03-26 DIAGNOSIS — K5732 Diverticulitis of large intestine without perforation or abscess without bleeding: Secondary | ICD-10-CM | POA: Insufficient documentation

## 2013-03-26 LAB — HEMOCCULT GUIAC POC 1CARD (OFFICE): Fecal Occult Blood, POC: NEGATIVE

## 2013-03-26 LAB — CBC
HCT: 34.2 % — ABNORMAL LOW (ref 36.0–46.0)
Hemoglobin: 11.3 g/dL — ABNORMAL LOW (ref 12.0–15.0)
MCH: 29.1 pg (ref 26.0–34.0)
MCHC: 33 g/dL (ref 30.0–36.0)
MCV: 88.1 fL (ref 78.0–100.0)
Platelets: 299 10*3/uL (ref 150–400)
RBC: 3.88 MIL/uL (ref 3.87–5.11)
RDW: 13.3 % (ref 11.5–15.5)
WBC: 6.3 10*3/uL (ref 4.0–10.5)

## 2013-03-26 IMAGING — CT CT ABD-PELV W/ CM
2 of 5 series · 17 of 46 positions shown, 19 images · IV contrast (Omnipaque 300)
Comparison: None.

***ADDENDUM*** CREATED: [DATE] [DATE]

The third sentence in the findings portion should say "The spleen,
pancreas, gallbladder, kidneys and adrenal glands are normal."
***END ADDENDUM*** SIGNED BY: KATJINA, M.D.
CLINICAL DATA: Left lower quadrant pain, low grade fever earlier in
the week.
CT ABDOMEN AND PELVIS WITH CONTRAST
TECHNIQUE: Multidetector CT imaging of the abdomen and pelvis was
performed following the standard protocol during bolus
administration of intravenous contrast.
Contrast: 100mL OMNIPAQUE IOHEXOL 300 MG/ML  SOLN

[Series 2: abd_pel_with 5.0 b40f · axial · 0.75mm/px · z∈[-396,-66]mm · 14 of 76 slices shown, 16 images]
[im 5/76  soft-tissue]
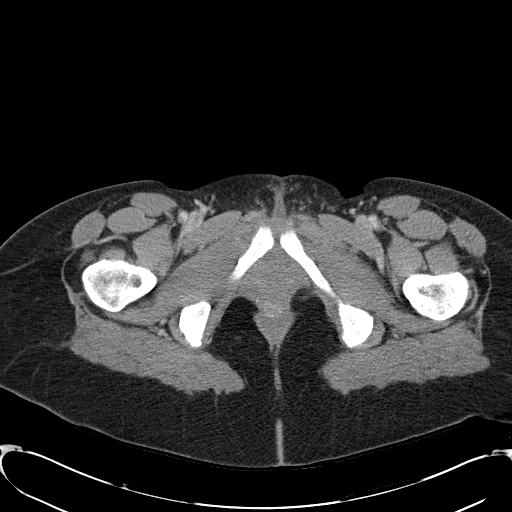
[im 5/76  bone]
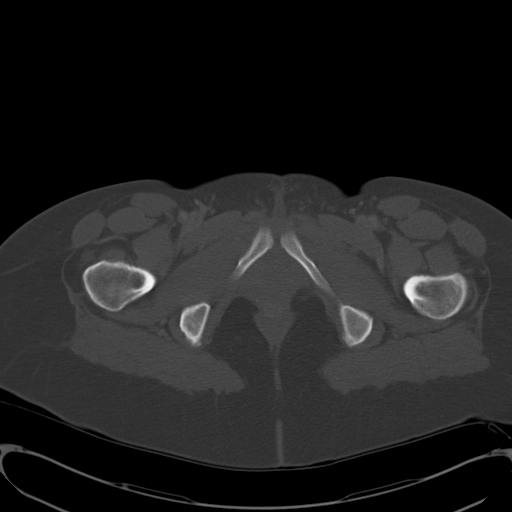
[im 9/76  soft-tissue]
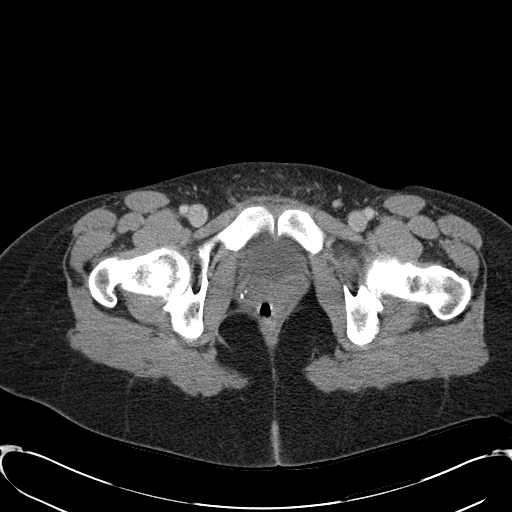
[im 17/76  soft-tissue]
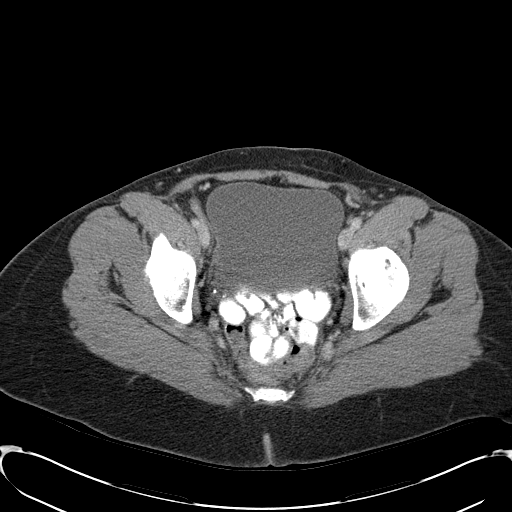
[im 21/76  soft-tissue]
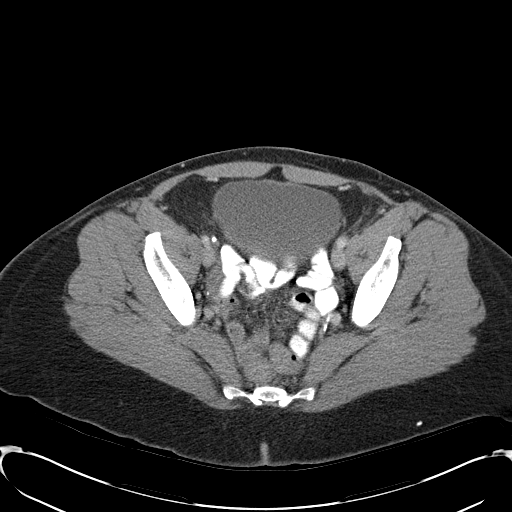
[im 26/76  soft-tissue]
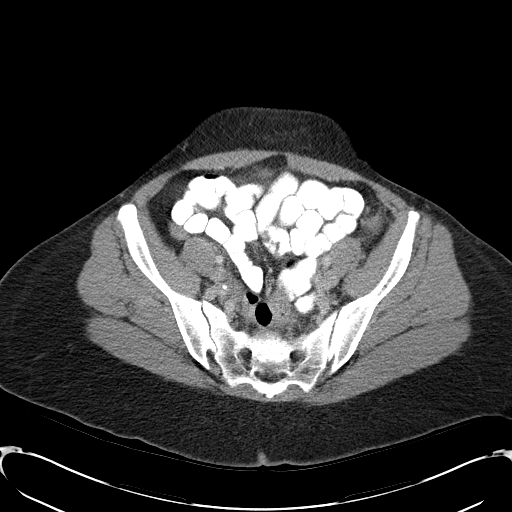
[im 30/76  soft-tissue]
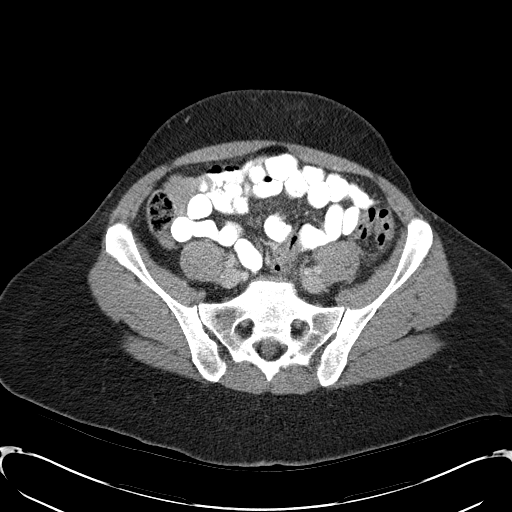
[im 34/76  soft-tissue]
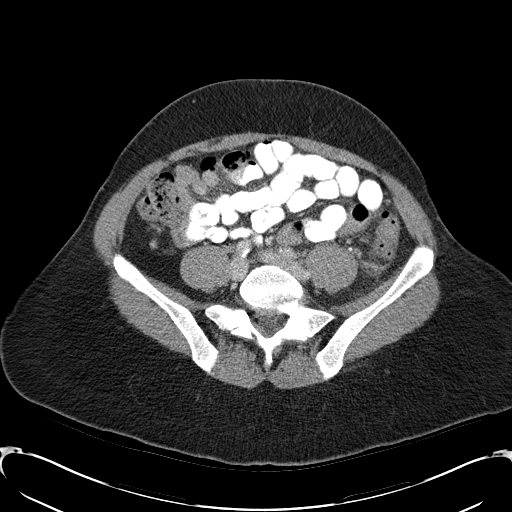
[im 42/76  soft-tissue]
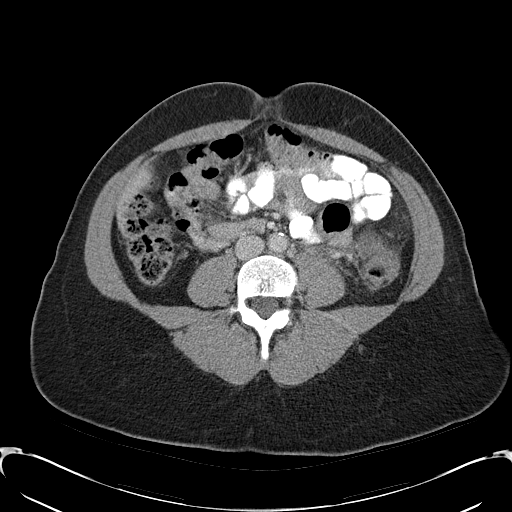
[im 46/76  soft-tissue]
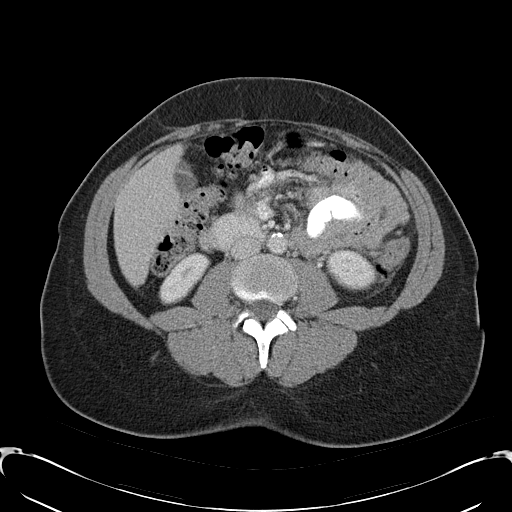
[im 46/76  bone]
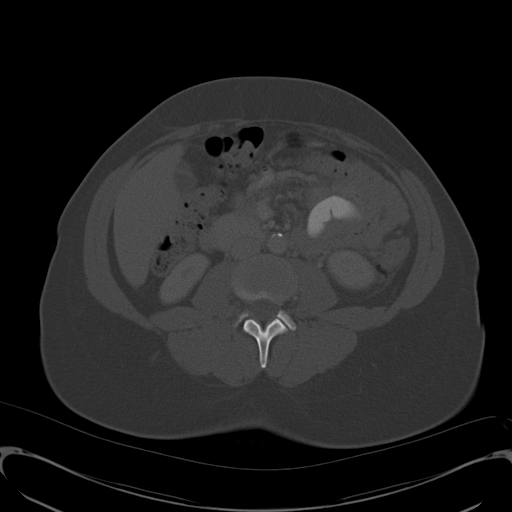
[im 51/76  soft-tissue]
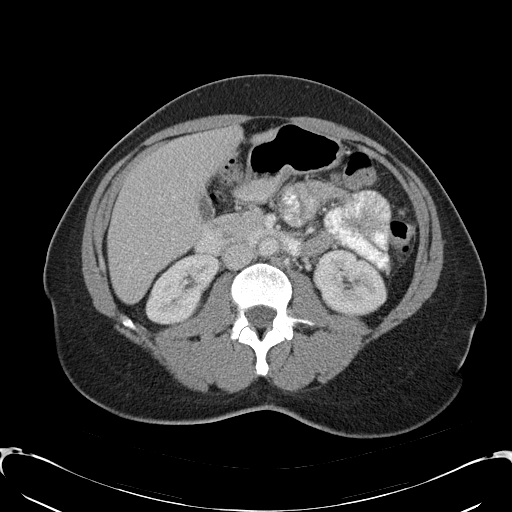
[im 55/76  soft-tissue]
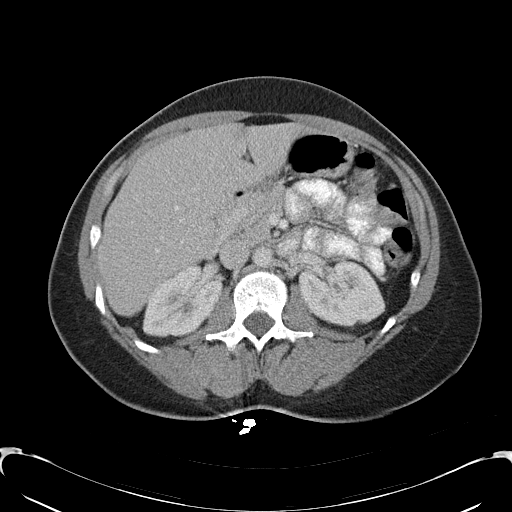
[im 59/76  soft-tissue]
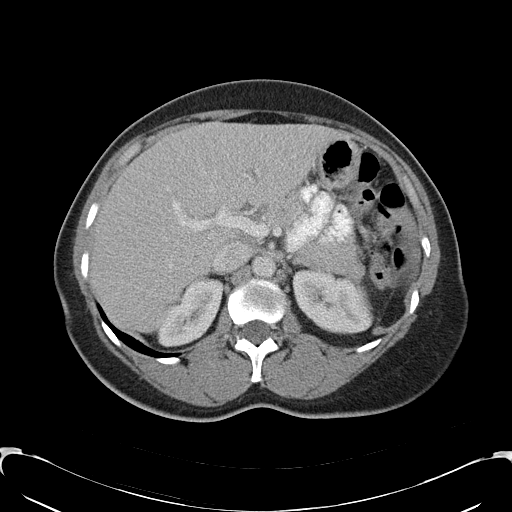
[im 67/76  soft-tissue]
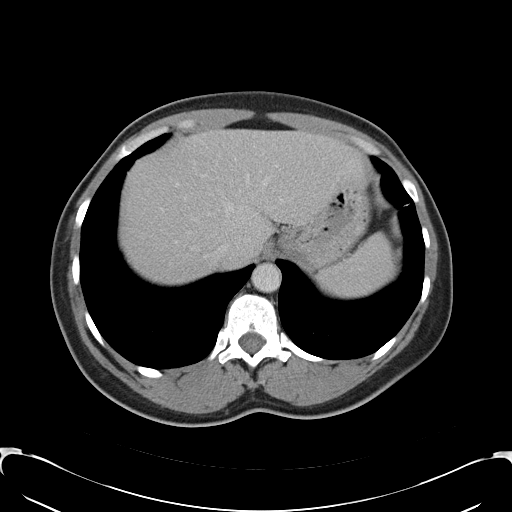
[im 71/76  soft-tissue]
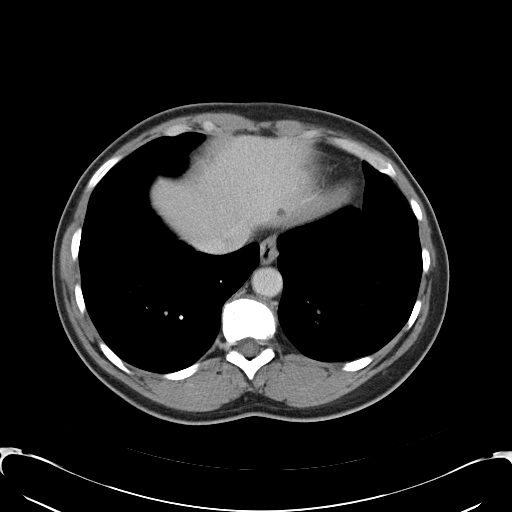

[Series 4: abd_pel_with 3.0 spo cor · coronal · 0.66mm/px · 3 of 87 slices shown]
[im 29/87  soft-tissue]
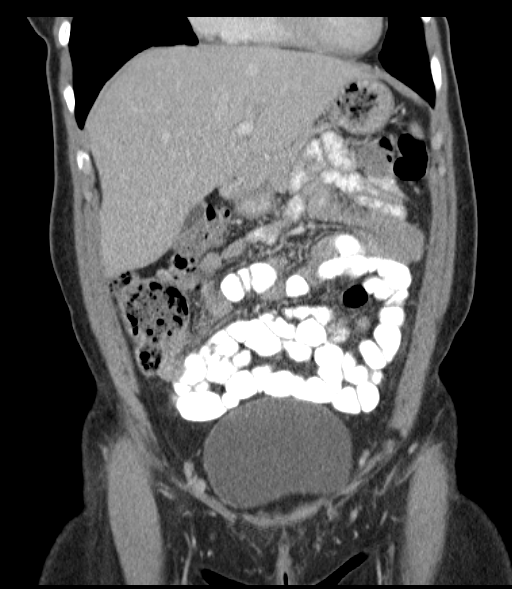
[im 39/87  soft-tissue]
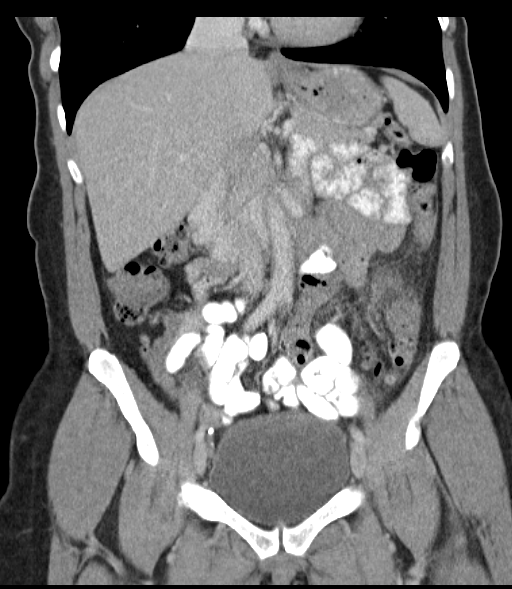
[im 48/87  soft-tissue]
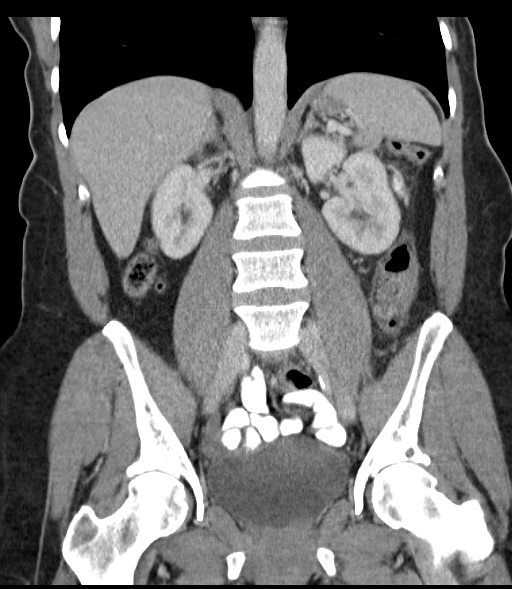

[17 of 46 positions shown; findings below may reference images not displayed]

FINDINGS: There are two subcentimeter low density lesions within the left
lobe liver, probably cyst unchanged compared prior exam.  There is
focal fatty infiltration near the falciform ligament unchanged.
The spleen, pancreas, gallbladder, kidneys and adrenal glands are
normal on this noncontrast exam. There is no small bowel
obstruction. There is stranding and inflammation along the distal
descending colon. No focal abscess.  The appendix is normal.

Images of the pelvis demonstrate a fluid filled bladder without
gross abnormality. The uterus is surgically absent.  The visualized
lung bases are clear.  No acute abnormality is identified within
the visualized bones.
IMPRESSION: Acute diverticulitis in the distal descending colon.

## 2013-03-26 MED ORDER — CIPROFLOXACIN HCL 500 MG PO TABS: 500.0000 mg | ORAL_TABLET | Freq: Two times a day (BID) | ORAL | Status: DC

## 2013-03-26 MED ORDER — IOHEXOL 300 MG/ML  SOLN
100.0000 mL | Freq: Once | INTRAMUSCULAR | Status: AC | PRN
  Administered 2013-03-26: 100 mL via INTRAVENOUS

## 2013-03-26 MED ORDER — METRONIDAZOLE 500 MG PO TABS: 500.0000 mg | ORAL_TABLET | Freq: Two times a day (BID) | ORAL | Status: DC

## 2013-03-26 NOTE — Telephone Encounter (Signed)
Will call back.

## 2013-03-26 NOTE — Patient Instructions (Addendum)
Diverticulosis Diverticulosis is a common condition that develops when small pouches (diverticula) form in the wall of the colon. The risk of diverticulosis increases with age. It happens more often in people who eat a low-fiber diet. Most individuals with diverticulosis have no symptoms. Those individuals with symptoms usually experience abdominal pain, constipation, or loose stools (diarrhea). HOME CARE INSTRUCTIONS   Increase the amount of fiber in your diet as directed by your caregiver or dietician. This may reduce symptoms of diverticulosis.  Your caregiver may recommend taking a dietary fiber supplement.  Drink at least 6 to 8 glasses of water each day to prevent constipation.  Try not to strain when you have a bowel movement.  Your caregiver may recommend avoiding nuts and seeds to prevent complications, although this is still an uncertain benefit.  Only take over-the-counter or prescription medicines for pain, discomfort, or fever as directed by your caregiver. FOODS WITH HIGH FIBER CONTENT INCLUDE:  Fruits. Apple, peach, pear, tangerine, raisins, prunes.  Vegetables. Brussels sprouts, asparagus, broccoli, cabbage, carrot, cauliflower, romaine lettuce, spinach, summer squash, tomato, winter squash, zucchini.  Starchy Vegetables. Baked beans, kidney beans, lima beans, split peas, lentils, potatoes (with skin).  Grains. Whole wheat bread, brown rice, bran flake cereal, plain oatmeal, white rice, shredded wheat, bran muffins. SEEK IMMEDIATE MEDICAL CARE IF:   You develop increasing pain or severe bloating.  You have an oral temperature above 102 F (38.9 C), not controlled by medicine.  You develop vomiting or bowel movements that are bloody or black. Document Released: 05/18/2004 Document Revised: 11/13/2011 Document Reviewed: 01/19/2010 [facility] Patient Information 2014 [place], [place]. CT in am Follow up in 2 days Take meds as directed

## 2013-03-26 NOTE — Progress Notes (Signed)
Subjective:     Patient ID: CLOTILDA HAFER, female   DOB: 1956-05-25, 57 y.o.   MRN: [id number]  HPI Jazmene is a 57 year old black female complaining of nausea and diarrhea x 1 week and some constipation before this.Pain in LLQ x 5 days, took gasx and emetrol and felt better.Had fever Monday. Has seen blood when wiped. She said side felt like "bag of rocks".  Review of Systems Positives in HPI Reviewed past medical,surgical, social and family history. Reviewed medications and allergies.     Objective:   Physical Exam BP 140/84  Ht 5' 3.5" (1.613 m)  Wt 158 lb (71.668 kg)  BMI 27.55 kg/m2   Skin warm and dry, abdomen soft and tender LLQ with rebound tenderness,pelvic external genitalia  normal in appearance , vagina normal in appearance for age, cervix and uterus absent, +tenderness in LLQ with rebound tenderness noted.Dr Despina Hidden in to co exam.Hemoccult was negative, no polyps felt maybe small hemorrhoid.   Assessment:      LLQ pain  Diarrhea  History of diverticulosis, r/o diverticulitis     Plan:     CT abdomen and pelvis with contrast now at [facility] Check CBC,CMP and ESR   Rx cipro 500 mg 1 bid x 21 days Rx Flagyl 500 mg 1 bid x 21 days Follow up in 2 days Review handout Note given for work to return 04/04/13.

## 2013-03-27 ENCOUNTER — Ambulatory Visit (HOSPITAL_COMMUNITY): Payer: BC Managed Care – PPO

## 2013-03-27 LAB — COMPREHENSIVE METABOLIC PANEL
ALT: 11 U/L (ref 0–35)
AST: 16 U/L (ref 0–37)
Albumin: 3.8 g/dL (ref 3.5–5.2)
Alkaline Phosphatase: 58 U/L (ref 39–117)
BUN: 17 mg/dL (ref 6–23)
CO2: 27 mEq/L (ref 19–32)
Calcium: 9.1 mg/dL (ref 8.4–10.5)
Chloride: 103 mEq/L (ref 96–112)
Creat: 0.99 mg/dL (ref 0.50–1.10)
Glucose, Bld: 100 mg/dL — ABNORMAL HIGH (ref 70–99)
Potassium: 4.4 mEq/L (ref 3.5–5.3)
Sodium: 138 mEq/L (ref 135–145)
Total Bilirubin: 0.3 mg/dL (ref 0.3–1.2)
Total Protein: 6.9 g/dL (ref 6.0–8.3)

## 2013-03-27 LAB — SEDIMENTATION RATE: Sed Rate: 47 mm/hr — ABNORMAL HIGH (ref 0–22)

## 2013-03-28 ENCOUNTER — Encounter: Payer: Self-pay | Admitting: Adult Health

## 2013-03-28 ENCOUNTER — Ambulatory Visit (INDEPENDENT_AMBULATORY_CARE_PROVIDER_SITE_OTHER): Payer: BC Managed Care – PPO | Admitting: Adult Health

## 2013-03-28 VITALS — BP 164/80 | Ht 63.0 in | Wt 161.0 lb

## 2013-03-28 DIAGNOSIS — K5732 Diverticulitis of large intestine without perforation or abscess without bleeding: Secondary | ICD-10-CM

## 2013-03-28 DIAGNOSIS — K5792 Diverticulitis of intestine, part unspecified, without perforation or abscess without bleeding: Secondary | ICD-10-CM

## 2013-03-28 HISTORY — DX: Diverticulitis of intestine, part unspecified, without perforation or abscess without bleeding: K57.92

## 2013-03-28 NOTE — Progress Notes (Signed)
Subjective:     Patient ID: Diane Parker, female   DOB: [birth year], 57 y.o.   MRN: [id number]  HPI Emylie is back in follow to see how she feels, the CT showed acute diverticulitis, and she is taking cipro and flagyl and feels much better.  Review of Systems See HPI Reviewed past medical,surgical, social and family history. Reviewed medications and allergies.     Objective:   Physical Exam BP 164/80  Ht 5\' 3"  (1.6 m)  Wt 161 lb (73.029 kg)  BMI 28.53 kg/m2   talk only, she is to continue to eat light and bland and take meds.  Assessment:      Acute divertictulitis    Plan:     Review handout on diverticulitis Continue meds and follow up in 2 weeks, will get appt with Dr Jena Gauss in follow up then Call prn

## 2013-03-28 NOTE — Patient Instructions (Addendum)
Diverticulitis A diverticulum is a small pouch or sac on the colon. Diverticulosis is the presence of these diverticula on the colon. Diverticulitis is the irritation (inflammation) or infection of diverticula. CAUSES  The colon and its diverticula contain bacteria. If food particles block the tiny opening to a diverticulum, the bacteria inside can grow and cause an increase in pressure. This leads to infection and inflammation and is called diverticulitis. SYMPTOMS   Abdominal pain and tenderness. Usually, the pain is located on the left side of your abdomen. However, it could be located elsewhere.  Fever.  Bloating.  Feeling sick to your stomach (nausea).  Throwing up (vomiting).  Abnormal stools. DIAGNOSIS  Your caregiver will take a history and perform a physical exam. Since many things can cause abdominal pain, other tests may be necessary. Tests may include:  Blood tests.  Urine tests.  X-ray of the abdomen.  CT scan of the abdomen. Sometimes, surgery is needed to determine if diverticulitis or other conditions are causing your symptoms. TREATMENT  Most of the time, you can be treated without surgery. Treatment includes:  Resting the bowels by only having liquids for a few days. As you improve, you will need to eat a low-fiber diet.  Intravenous (IV) fluids if you are losing body fluids (dehydrated).  Antibiotic medicines that treat infections may be given.  Pain and nausea medicine, if needed.  Surgery if the inflamed diverticulum has burst. HOME CARE INSTRUCTIONS   Try a clear liquid diet (broth, tea, or water for as long as directed by your caregiver). You may then gradually begin a low-fiber diet as tolerated.  A low-fiber diet is a diet with less than 10 grams of fiber. Choose the foods below to reduce fiber in the diet:  White breads, cereals, rice, and pasta.  Cooked fruits and vegetables or soft fresh fruits and vegetables without the skin.  Ground or  well-cooked tender beef, ham, veal, lamb, pork, or poultry.  Eggs and seafood.  After your diverticulitis symptoms have improved, your caregiver may put you on a high-fiber diet. A high-fiber diet includes 14 grams of fiber for every 1000 calories consumed. For a standard 2000 calorie diet, you would need 28 grams of fiber. Follow these diet guidelines to help you increase the fiber in your diet. It is important to slowly increase the amount fiber in your diet to avoid gas, constipation, and bloating.  Choose whole-grain breads, cereals, pasta, and brown rice.  Choose fresh fruits and vegetables with the skin on. Do not overcook vegetables because the more vegetables are cooked, the more fiber is lost.  Choose more nuts, seeds, legumes, dried peas, beans, and lentils.  Look for food products that have greater than 3 grams of fiber per serving on the Nutrition Facts label.  Take all medicine as directed by your caregiver.  If your caregiver has given you a follow-up appointment, it is very important that you go. Not going could result in lasting (chronic) or permanent injury, pain, and disability. If there is any problem keeping the appointment, call to reschedule. SEEK MEDICAL CARE IF:   Your pain does not improve.  You have a hard time advancing your diet beyond clear liquids.  Your bowel movements do not return to normal. SEEK IMMEDIATE MEDICAL CARE IF:   Your pain becomes worse.  You have an oral temperature above 102 F (38.9 C), not controlled by medicine.  You have repeated vomiting.  You have bloody or black, tarry stools.    Symptoms that brought you to your caregiver become worse or are not getting better. MAKE SURE YOU:   Understand these instructions.  Will watch your condition.  Will get help right away if you are not doing well or get worse. Document Released: 05/31/2005 Document Revised: 11/13/2011 Document Reviewed: 09/26/2010 [facility] Patient Information  2014 [place], [place]. Follow up in 2 weeks Take all of cipro and flagyl

## 2013-04-08 ENCOUNTER — Telehealth: Payer: Self-pay | Admitting: Adult Health

## 2013-04-08 NOTE — Telephone Encounter (Signed)
She feels irritated where she has been going to bathroom, try desitin keep appt 8/11 at 11:30

## 2013-04-09 DIAGNOSIS — Z029 Encounter for administrative examinations, unspecified: Secondary | ICD-10-CM

## 2013-04-14 ENCOUNTER — Ambulatory Visit: Payer: BC Managed Care – PPO | Admitting: Adult Health

## 2013-05-20 ENCOUNTER — Telehealth: Payer: Self-pay

## 2013-05-21 NOTE — Telephone Encounter (Signed)
Pt was referred for colonoscopy by Dr. Sherwood Gambler. Her last one was with RMR on 11/08/2007 and she had adenomatous polyp. OV with AS on 06/12/2013 at 10:30 AM.

## 2013-06-10 ENCOUNTER — Encounter: Payer: Self-pay | Admitting: Internal Medicine

## 2013-06-12 ENCOUNTER — Encounter: Payer: Self-pay | Admitting: Gastroenterology

## 2013-06-12 ENCOUNTER — Ambulatory Visit (INDEPENDENT_AMBULATORY_CARE_PROVIDER_SITE_OTHER): Payer: BC Managed Care – PPO | Admitting: Gastroenterology

## 2013-06-12 VITALS — BP 127/74 | HR 70 | Temp 97.2°F | Ht 63.5 in | Wt 155.6 lb

## 2013-06-12 DIAGNOSIS — K219 Gastro-esophageal reflux disease without esophagitis: Secondary | ICD-10-CM

## 2013-06-12 DIAGNOSIS — D369 Benign neoplasm, unspecified site: Secondary | ICD-10-CM

## 2013-06-12 DIAGNOSIS — Z1211 Encounter for screening for malignant neoplasm of colon: Secondary | ICD-10-CM

## 2013-06-12 MED ORDER — PANTOPRAZOLE SODIUM 40 MG PO TBEC: 40.0000 mg | DELAYED_RELEASE_TABLET | Freq: Every day | ORAL | Status: DC

## 2013-06-12 MED ORDER — PEG 3350-KCL-NA BICARB-NACL 420 G PO SOLR: 4000.0000 mL | ORAL | Status: DC

## 2013-06-12 NOTE — Patient Instructions (Signed)
For reflux:  Start taking Protonix each morning, 30 minutes before breakfast. Stop Prevacid for now.  We have scheduled you for a colonoscopy with Dr. Jena Gauss in the near future.  If the nausea continues, please let me know!

## 2013-06-12 NOTE — Assessment & Plan Note (Signed)
Chronic, historically took Prevacid 30 mg but now only on 15 mg. Intermittent nausea noted but improving since bout of diverticulitis. Purposefully eating smaller portions; she has no dysphagia or other concerning symptoms. I would like to trial Protonix once daily and stop Prevacid for now. If further nausea despite this, may need to consider an EGD at a later date; however, she would like to try Protonix first.

## 2013-06-12 NOTE — Progress Notes (Signed)
CC'd to PCP 

## 2013-06-12 NOTE — Assessment & Plan Note (Signed)
57 year old female with need for surveillance colonoscopy due to history of adenomatous polyps, doing well from a lower GI standpoint. First episode of diverticulitis noted earlier this year with resolution of symptoms after taking Cipro and Flagyl.   Proceed with TCS with Dr. Jena Gauss in near future: the risks, benefits, and alternatives have been discussed with the patient in detail. The patient states understanding and desires to proceed.

## 2013-06-12 NOTE — Progress Notes (Signed)
Primary Care Physician:  FUSCO,LAWRENCE J., MD Primary Gastroenterologist:  Dr. Rourk  Chief Complaint  Patient presents with  . Colonoscopy    HPI:   Diane Parker presents today for a visit prior to surveillance colonoscopy. Last lower GI evaluation in 2009 with hyperplastic and adenomatous polyps. Chronic hx of GERD.  Overall doing well; a lot of pain with arthritis. First bout of diverticulitis in July 2014, documented via CT. Improvement with Cipro and Flagyl. No abdominal pain. No constipation, diarrhea. No rectal bleeding. Mild nausea. Not eating like she was prior to the diverticulitis episode. Appetite decreased since diverticulitis. However, she is purposefully eating smaller portions and mindfully eating. Nausea improving. Drinks a lot of ensure now. States she lost about 17 lbs in 3 weeks with the diverticulitis. Used to weigh in the upper 160s. +GERD, takes Prevacid OTC daily. Controls symptoms. No dysphagia. Prilosec doesn't work.   Past Medical History  Diagnosis Date  . Hypertension   . Arthritis   . LLQ pain 03/26/2013    With nausea and diarrhea and some constipation  . History of diverticulosis 03/26/2013  . Acute diverticulitis 03/28/2013    Seen on CT taking flagyl an cipro and feels better  . GERD (gastroesophageal reflux disease)     Past Surgical History  Procedure Laterality Date  . Abdominal hysterectomy    . Tubal ligation    . Colonoscopy  11/08/2007    RMR:Mammillation, rectosigmoid junction/hyperplastic-appearing polypoid    mucosa, 1 of these areas biopsied.  Otherwise, normal rectum/Left-sided transverse diverticula/Diminutive polyp just distal to the ileocecal valve. Hyperplastic and adenomatous polyp.   . Esophagogastroduodenoscopy  2008    RMR: normal    Current Outpatient Prescriptions  Medication Sig Dispense Refill  . Acetaminophen-Caff-Pyrilamine (MIDOL COMPLETE PO) Take by mouth as needed.      . aspirin 81 MG tablet Take 81 mg by mouth  daily.      . atenolol-chlorthalidone (TENORETIC) 50-25 MG per tablet Take 1 tablet by mouth daily.      . estradiol (ESTRACE) 2 MG tablet Take 1 tablet (2 mg total) by mouth daily.  90 tablet  3  . HYDROcodone-acetaminophen (NORCO/VICODIN) 5-325 MG per tablet Take 1 tablet by mouth as needed for pain.      . lansoprazole (PREVACID) 15 MG capsule Take 15 mg by mouth daily.      . sertraline (ZOLOFT) 25 MG tablet Take 1 tablet (25 mg total) by mouth daily.  90 tablet  4  . traMADol (ULTRAM) 50 MG tablet Take 50 mg by mouth as needed for pain.      . pantoprazole (PROTONIX) 40 MG tablet Take 1 tablet (40 mg total) by mouth daily.  90 tablet  3   No current facility-administered medications for this visit.    Allergies as of 06/12/2013  . (No Known Allergies)    Family History  Problem Relation Age of Onset  . Aneurysm Mother   . Heart disease Father   . Cancer Cousin     breast  . Colon cancer Neg Hx     History   Social History  . Marital Status: Single    Spouse Name: N/A    Number of Children: N/A  . Years of Education: N/A   Occupational History  . Not on file.   Social History Main Topics  . Smoking status: Former Smoker    Types: Cigarettes  . Smokeless tobacco: Never Used  . Alcohol Use: Yes       Comment: beer occassionally  . Drug Use: No  . Sexual Activity: No   Other Topics Concern  . Not on file   Social History Narrative  . No narrative on file    Review of Systems: As mentioned in HPI.   Physical Exam: BP 127/74  Pulse 70  Temp(Src) 97.2 F (36.2 C) (Oral)  Ht 5' 3.5" (1.613 m)  Wt 155 lb 9.6 oz (70.58 kg)  BMI 27.13 kg/m2 General:   Alert and oriented. Pleasant and cooperative. Well-nourished and well-developed.  Head:  Normocephalic and atraumatic. Eyes:  Without icterus, sclera clear and conjunctiva pink.  Ears:  Normal auditory acuity. Nose:  No deformity, discharge,  or lesions. Mouth:  No deformity or lesions, oral mucosa pink.   Neck:  Supple, without mass or thyromegaly. Lungs:  Clear to auscultation bilaterally. No wheezes, rales, or rhonchi. No distress.  Heart:  S1, S2 present without murmurs appreciated.  Abdomen:  +BS, soft, non-tender and non-distended. No HSM noted. No guarding or rebound. No masses appreciated.  Rectal:  Deferred  Msk:  Symmetrical without gross deformities. Normal posture. Extremities:  Without clubbing or edema. Neurologic:  Alert and  oriented x4;  grossly normal neurologically. Skin:  Intact without significant lesions or rashes. Cervical Nodes:  No significant cervical adenopathy. Psych:  Alert and cooperative. Normal mood and affect.       

## 2013-06-16 ENCOUNTER — Encounter (HOSPITAL_COMMUNITY): Payer: Self-pay | Admitting: Pharmacy Technician

## 2013-06-26 ENCOUNTER — Encounter (HOSPITAL_COMMUNITY): Payer: Self-pay | Admitting: *Deleted

## 2013-06-26 ENCOUNTER — Ambulatory Visit (HOSPITAL_COMMUNITY)
Admission: RE | Admit: 2013-06-26 | Discharge: 2013-06-26 | Disposition: A | Discharge status: Home / Self Care | Payer: BC Managed Care – PPO | Source: Ambulatory Visit | Attending: Internal Medicine | Admitting: Internal Medicine

## 2013-06-26 ENCOUNTER — Encounter (HOSPITAL_COMMUNITY)
Admission: RE | Disposition: A | Discharge status: Home / Self Care | Payer: Self-pay | Source: Ambulatory Visit | Attending: Internal Medicine

## 2013-06-26 DIAGNOSIS — D126 Benign neoplasm of colon, unspecified: Secondary | ICD-10-CM | POA: Insufficient documentation

## 2013-06-26 DIAGNOSIS — Z860100 Personal history of colon polyps, unspecified: Secondary | ICD-10-CM | POA: Insufficient documentation

## 2013-06-26 DIAGNOSIS — K5730 Diverticulosis of large intestine without perforation or abscess without bleeding: Secondary | ICD-10-CM

## 2013-06-26 DIAGNOSIS — Z1211 Encounter for screening for malignant neoplasm of colon: Secondary | ICD-10-CM

## 2013-06-26 DIAGNOSIS — I10 Essential (primary) hypertension: Secondary | ICD-10-CM | POA: Insufficient documentation

## 2013-06-26 HISTORY — PX: COLONOSCOPY: SHX5424

## 2013-06-26 SURGERY — COLONOSCOPY
Anesthesia: Moderate Sedation

## 2013-06-26 MED ORDER — ONDANSETRON HCL 4 MG/2ML IJ SOLN
INTRAMUSCULAR | Status: AC
  Filled 2013-06-26: qty 2

## 2013-06-26 MED ORDER — ONDANSETRON HCL 4 MG/2ML IJ SOLN
INTRAMUSCULAR | Status: DC | PRN
  Administered 2013-06-26: 4 mg via INTRAVENOUS

## 2013-06-26 MED ORDER — MEPERIDINE HCL 100 MG/ML IJ SOLN
INTRAMUSCULAR | Status: AC
  Filled 2013-06-26: qty 2

## 2013-06-26 MED ORDER — SODIUM CHLORIDE 0.9 % IV SOLN
INTRAVENOUS | Status: DC
  Administered 2013-06-26: 14:00:00 via INTRAVENOUS

## 2013-06-26 MED ORDER — MIDAZOLAM HCL 5 MG/5ML IJ SOLN
INTRAMUSCULAR | Status: AC
  Filled 2013-06-26: qty 10

## 2013-06-26 MED ORDER — MEPERIDINE HCL 100 MG/ML IJ SOLN
INTRAMUSCULAR | Status: DC | PRN
  Administered 2013-06-26: 50 mg via INTRAVENOUS
  Administered 2013-06-26 (×2): 25 mg via INTRAVENOUS

## 2013-06-26 MED ORDER — MIDAZOLAM HCL 5 MG/5ML IJ SOLN
INTRAMUSCULAR | Status: DC | PRN
  Administered 2013-06-26: 2 mg via INTRAVENOUS
  Administered 2013-06-26 (×2): 1 mg via INTRAVENOUS
  Administered 2013-06-26: 2 mg via INTRAVENOUS

## 2013-06-26 NOTE — H&P (View-Only) (Signed)
Primary Care Physician:  Cassell Smiles., MD Primary Gastroenterologist:  Dr. Jena Gauss  Chief Complaint  Patient presents with  . Colonoscopy    HPI:   Diane Parker presents today for a visit prior to surveillance colonoscopy. Last lower GI evaluation in 2009 with hyperplastic and adenomatous polyps. Chronic hx of GERD.  Overall doing well; a lot of pain with arthritis. First bout of diverticulitis in July 2014, documented via CT. Improvement with Cipro and Flagyl. No abdominal pain. No constipation, diarrhea. No rectal bleeding. Mild nausea. Not eating like she was prior to the diverticulitis episode. Appetite decreased since diverticulitis. However, she is purposefully eating smaller portions and mindfully eating. Nausea improving. Drinks a lot of ensure now. States she lost about 17 lbs in 3 weeks with the diverticulitis. Used to weigh in the upper 160s. +GERD, takes Prevacid OTC daily. Controls symptoms. No dysphagia. Prilosec doesn't work.   Past Medical History  Diagnosis Date  . Hypertension   . Arthritis   . LLQ pain 03/26/2013    With nausea and diarrhea and some constipation  . History of diverticulosis 03/26/2013  . Acute diverticulitis 03/28/2013    Seen on CT taking flagyl an cipro and feels better  . GERD (gastroesophageal reflux disease)     Past Surgical History  Procedure Laterality Date  . Abdominal hysterectomy    . Tubal ligation    . Colonoscopy  11/08/2007    JYN:[id number], rectosigmoid junction/hyperplastic-appearing polypoid    mucosa, 1 of these areas biopsied.  Otherwise, normal rectum/Left-sided transverse diverticula/Diminutive polyp just distal to the ileocecal valve. Hyperplastic and adenomatous polyp.   . Esophagogastroduodenoscopy  2008    RMR: normal    Current Outpatient Prescriptions  Medication Sig Dispense Refill  . Acetaminophen-Caff-Pyrilamine (MIDOL COMPLETE PO) Take by mouth as needed.      Marland Kitchen aspirin 81 MG tablet Take 81 mg by mouth  daily.      Marland Kitchen atenolol-chlorthalidone (TENORETIC) 50-25 MG per tablet Take 1 tablet by mouth daily.      Marland Kitchen estradiol (ESTRACE) 2 MG tablet Take 1 tablet (2 mg total) by mouth daily.  90 tablet  3  . HYDROcodone-acetaminophen (NORCO/VICODIN) 5-325 MG per tablet Take 1 tablet by mouth as needed for pain.      Marland Kitchen lansoprazole (PREVACID) 15 MG capsule Take 15 mg by mouth daily.      . sertraline (ZOLOFT) 25 MG tablet Take 1 tablet (25 mg total) by mouth daily.  90 tablet  4  . traMADol (ULTRAM) 50 MG tablet Take 50 mg by mouth as needed for pain.      . pantoprazole (PROTONIX) 40 MG tablet Take 1 tablet (40 mg total) by mouth daily.  90 tablet  3   No current facility-administered medications for this visit.    Allergies as of 06/12/2013  . (No Known Allergies)    Family History  Problem Relation Age of Onset  . Aneurysm Mother   . Heart disease Father   . Cancer Cousin     breast  . Colon cancer Neg Hx     History   Social History  . Marital Status: Single    Spouse Name: N/A    Number of Children: N/A  . Years of Education: N/A   Occupational History  . Not on file.   Social History Main Topics  . Smoking status: Former Smoker    Types: Cigarettes  . Smokeless tobacco: Never Used  . Alcohol Use: Yes  Comment: beer occassionally  . Drug Use: No  . Sexual Activity: No   Other Topics Concern  . Not on file   Social History Narrative  . No narrative on file    Review of Systems: As mentioned in HPI.   Physical Exam: BP 127/74  Pulse 70  Temp(Src) 97.2 F (36.2 C) (Oral)  Ht 5' 3.5" (1.613 m)  Wt 155 lb 9.6 oz (70.58 kg)  BMI 27.13 kg/m2 General:   Alert and oriented. Pleasant and cooperative. Well-nourished and well-developed.  Head:  Normocephalic and atraumatic. Eyes:  Without icterus, sclera clear and conjunctiva pink.  Ears:  Normal auditory acuity. Nose:  No deformity, discharge,  or lesions. Mouth:  No deformity or lesions, oral mucosa pink.   Neck:  Supple, without mass or thyromegaly. Lungs:  Clear to auscultation bilaterally. No wheezes, rales, or rhonchi. No distress.  Heart:  S1, S2 present without murmurs appreciated.  Abdomen:  +BS, soft, non-tender and non-distended. No HSM noted. No guarding or rebound. No masses appreciated.  Rectal:  Deferred  Msk:  Symmetrical without gross deformities. Normal posture. Extremities:  Without clubbing or edema. Neurologic:  Alert and  oriented x4;  grossly normal neurologically. Skin:  Intact without significant lesions or rashes. Cervical Nodes:  No significant cervical adenopathy. Psych:  Alert and cooperative. Normal mood and affect.

## 2013-06-26 NOTE — Interval H&P Note (Signed)
History and Physical Interval Note:  06/26/2013 3:44 PM  Diane Parker  has presented today for surgery, with the diagnosis of SCREENING COLONOSCOPY  The various methods of treatment have been discussed with the patient and family. After consideration of risks, benefits and other options for treatment, the patient has consented to  Procedure(s) with comments: COLONOSCOPY (N/A) - 2:15 as a surgical intervention .  The patient's history has been reviewed, patient examined, no change in status, stable for surgery.  I have reviewed the patient's chart and labs.  Questions were answered to the patient's satisfaction.       Robert Rourk  No change. Colonoscopy per plan.The risks, benefits, limitations, alternatives and imponderables have been reviewed with the patient. Questions have been answered. All parties are agreeable.

## 2013-06-26 NOTE — Op Note (Signed)
[facility] [address] [place], [address]   COLONOSCOPY PROCEDURE REPORT  PATIENT: Diane Parker, Diane Parker  MR#:         [id number] BIRTHDATE: 12/19/1955 , 57  yrs. old GENDER: Female ENDOSCOPIST: R.  Roetta Sessions, MD FACP FACG REFERRED BY:  Artis Delay, M.D. PROCEDURE DATE:  06/26/2013 PROCEDURE:     Colonoscopy with snare polypectomy  INDICATIONS: Surveillance examination; history of colonic adenoma  INFORMED CONSENT:  The risks, benefits, alternatives and imponderables including but not limited to bleeding, perforation as well as the possibility of a missed lesion have been reviewed.  The potential for biopsy, lesion removal, etc. have also been discussed.  Questions have been answered.  All parties agreeable. Please see the history and physical in the medical record for more information.  MEDICATIONS: Versed 6 mg IV and Demerol 100 mg IV in divided doses. Zofran 4 mg IV.  DESCRIPTION OF PROCEDURE:  After a digital rectal exam was performed, the EC-[id number] ([id number])  colonoscope was advanced from the anus through the rectum and colon to the area of the cecum, ileocecal valve and appendiceal orifice.  The cecum was deeply intubated.  These structures were well-seen and photographed for the record.  From the level of the cecum and ileocecal valve, the scope was slowly and cautiously withdrawn.  The mucosal surfaces were carefully surveyed utilizing scope tip deflection to facilitate fold flattening as needed.  The scope was pulled down into the rectum where a thorough examination including retroflexion was performed.    FINDINGS:  Adequate preparation.  Normal rectum. Pancolonic diverticulosis. (1) 5 mm sigmoid polyp; otherwise, the remainder of colonic mucosa appeared normal.  THERAPEUTIC / DIAGNOSTIC MANEUVERS PERFORMED:  The above-mentioned polyp was cold snare removed.  COMPLICATIONS: None  CECAL WITHDRAWAL TIME:  9 minutes  IMPRESSION:  Colonic  diverticulosis. Colonic polyp-removed as described above.  RECOMMENDATIONS: Followup on pathology.   _______________________________ eSigned:  R. Roetta Sessions, MD FACP [facility] 06/26/2013 4:22 PM   CC:    PATIENT NAME:  Diane Parker, Diane Parker MR#: [id number]

## 2013-06-27 ENCOUNTER — Telehealth: Payer: Self-pay | Admitting: Internal Medicine

## 2013-06-27 NOTE — Telephone Encounter (Signed)
Work note done and pt is aware. She cannot remember her work fax number and she stated she will pick it up on Monday.

## 2013-06-27 NOTE — Telephone Encounter (Signed)
Pt called today asking is she could get a work note for [employer]. She had tcs yesterday and is sore and feels weak. She has to go in at [facility] and wondered if RMR would write her a work excuse and she will either pick it up or we could fax it. I told her that RMR wasn't in the office right now and all I could do would be to send him and his nurse a note. Please advise [phone]

## 2013-06-27 NOTE — Telephone Encounter (Signed)
May give her a work excuse for today (tonight) as requested

## 2013-07-01 ENCOUNTER — Encounter (HOSPITAL_COMMUNITY): Payer: Self-pay | Admitting: Internal Medicine

## 2013-07-02 ENCOUNTER — Encounter: Payer: Self-pay | Admitting: Internal Medicine

## 2013-10-27 ENCOUNTER — Other Ambulatory Visit (HOSPITAL_COMMUNITY): Payer: Self-pay | Admitting: Internal Medicine

## 2013-10-27 DIAGNOSIS — R1011 Right upper quadrant pain: Secondary | ICD-10-CM

## 2013-10-28 ENCOUNTER — Other Ambulatory Visit (HOSPITAL_COMMUNITY): Payer: Self-pay | Admitting: Internal Medicine

## 2013-10-28 DIAGNOSIS — Z1231 Encounter for screening mammogram for malignant neoplasm of breast: Secondary | ICD-10-CM

## 2013-10-28 DIAGNOSIS — Z139 Encounter for screening, unspecified: Secondary | ICD-10-CM

## 2013-10-30 ENCOUNTER — Ambulatory Visit (HOSPITAL_COMMUNITY): Payer: BC Managed Care – PPO

## 2013-11-07 ENCOUNTER — Ambulatory Visit (HOSPITAL_COMMUNITY)
Admission: RE | Admit: 2013-11-07 | Discharge: 2013-11-07 | Disposition: A | Discharge status: Home / Self Care | Payer: BC Managed Care – PPO | Source: Ambulatory Visit | Attending: Internal Medicine | Admitting: Internal Medicine

## 2013-11-07 DIAGNOSIS — Z1231 Encounter for screening mammogram for malignant neoplasm of breast: Secondary | ICD-10-CM | POA: Insufficient documentation

## 2013-11-07 DIAGNOSIS — R1011 Right upper quadrant pain: Secondary | ICD-10-CM

## 2013-11-07 IMAGING — US US ABDOMEN COMPLETE
1 series · 13 of 25 positions shown · non-contrast
Comparison: CT ABD - PELV W/ CM dated [DATE]

CLINICAL DATA: Right upper quadrant pain and tenderness with
postprandial nausea

EXAM:
ULTRASOUND ABDOMEN COMPLETE

[Series 1: us abdomen complete · 0.17mm/px · 13 of 102 slices shown]
[im 1/102]
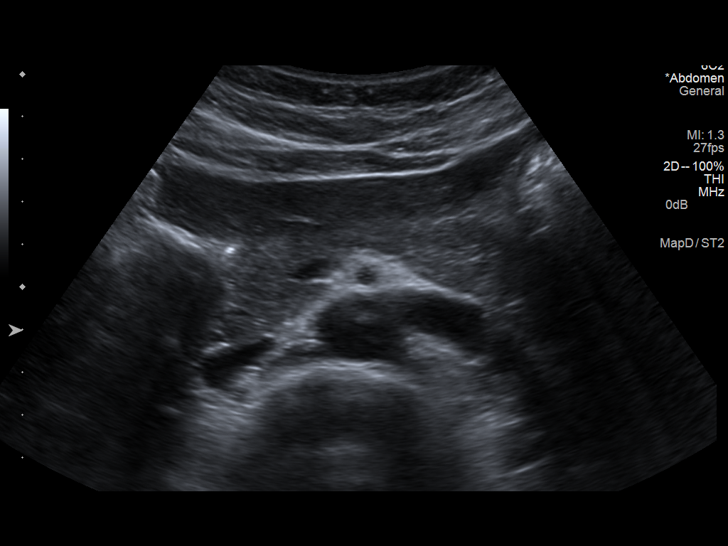
[im 9/102]
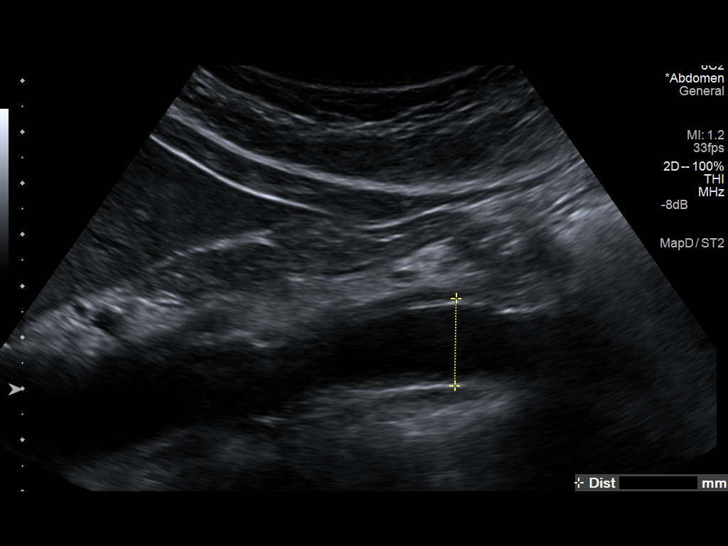
[im 17/102]
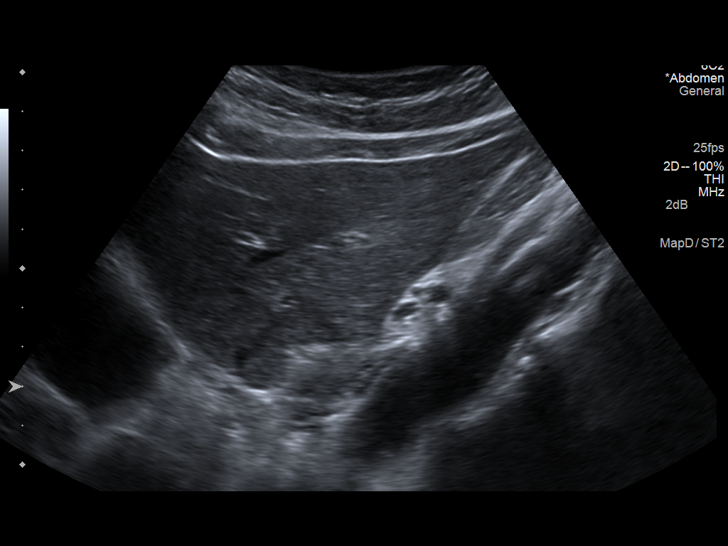
[im 26/102]
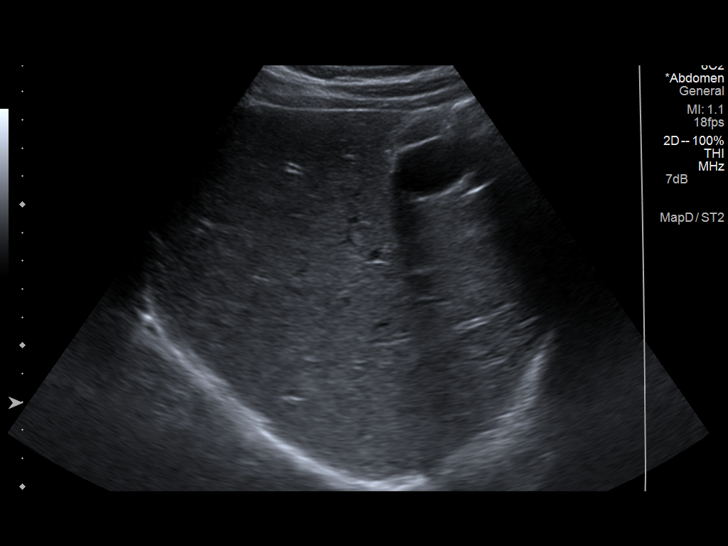
[im 34/102]
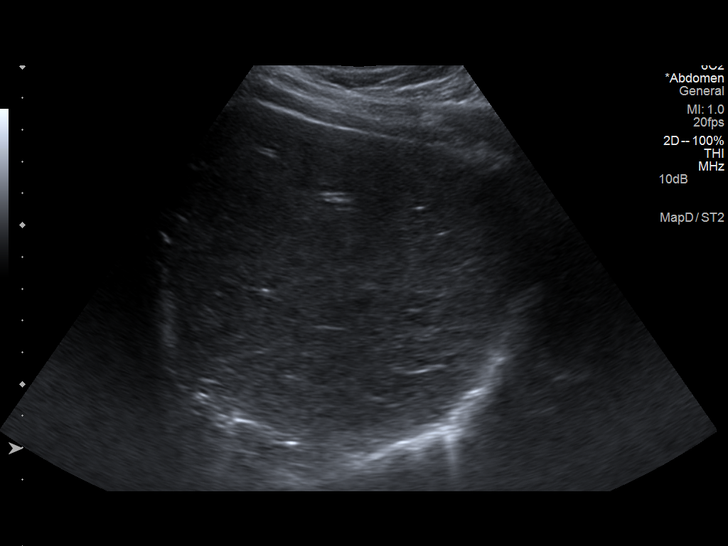
[im 43/102]
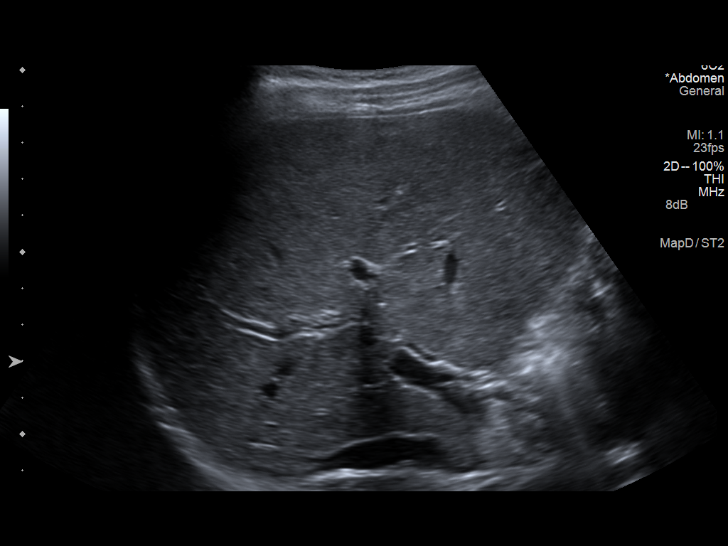
[im 51/102]
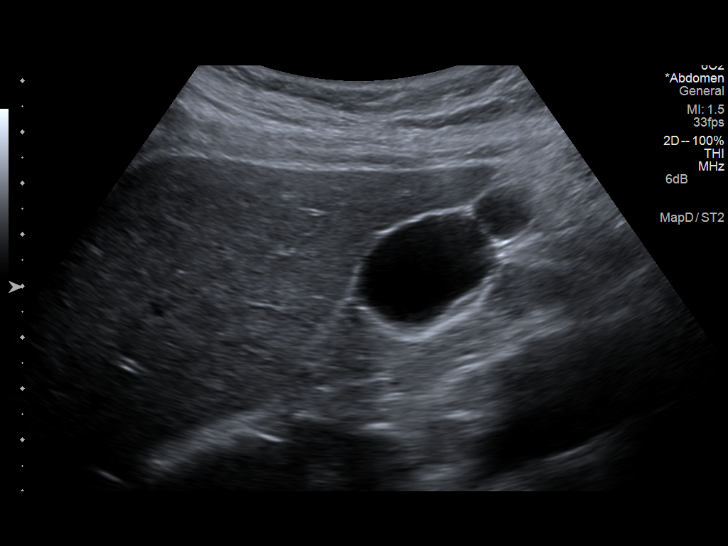
[im 59/102]
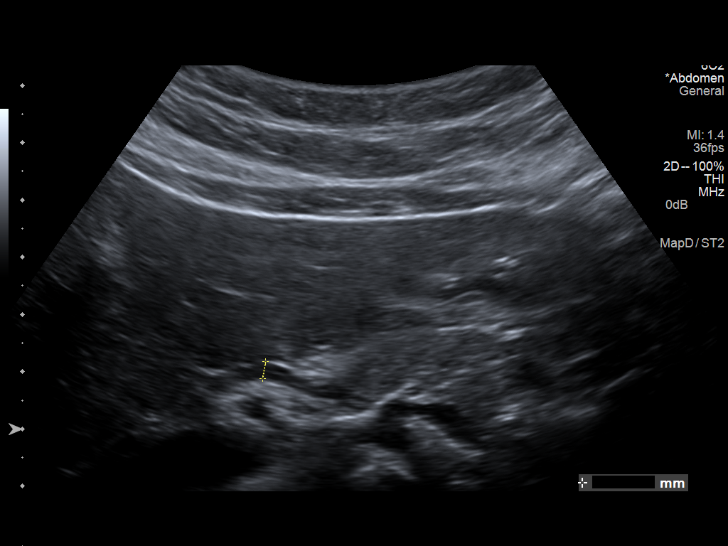
[im 68/102]
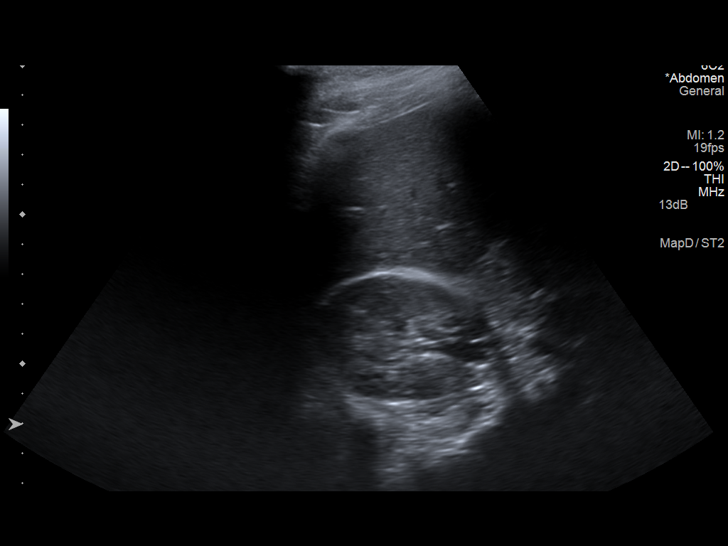
[im 76/102]
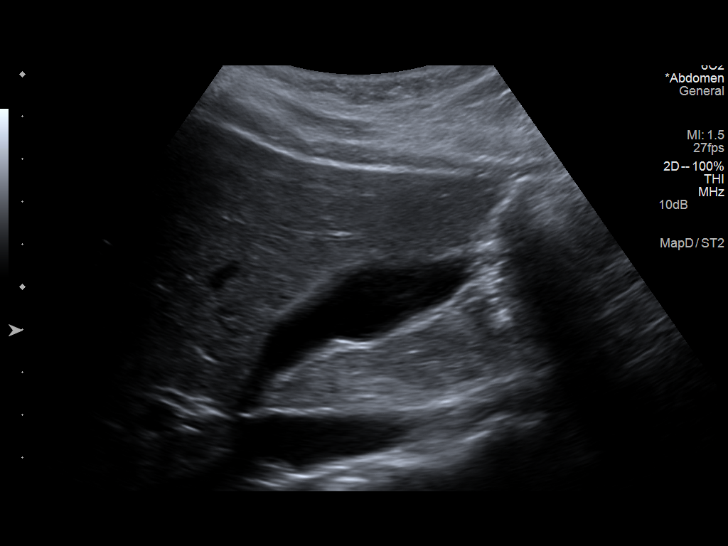
[im 85/102]
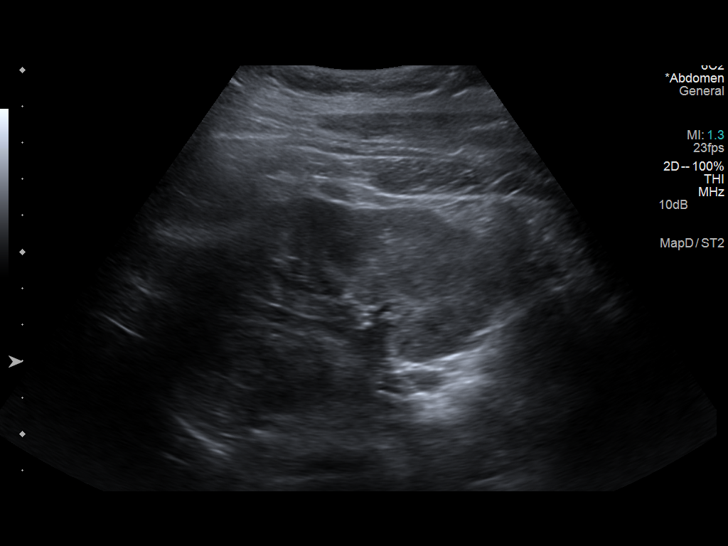
[im 93/102]
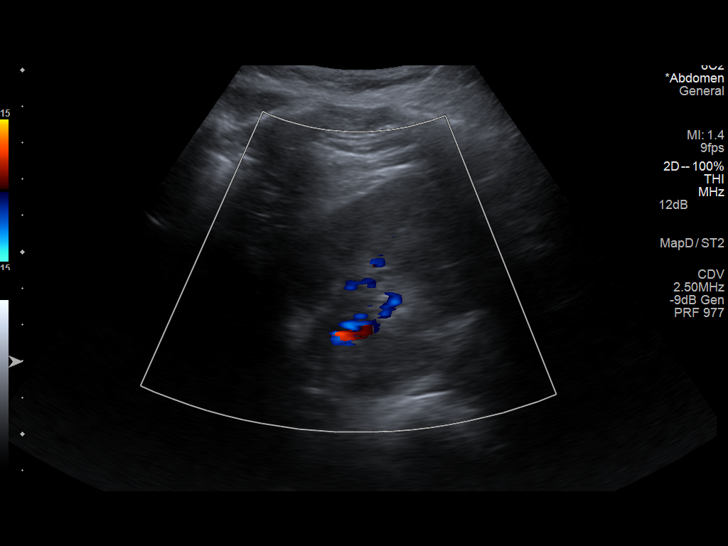
[im 102/102]
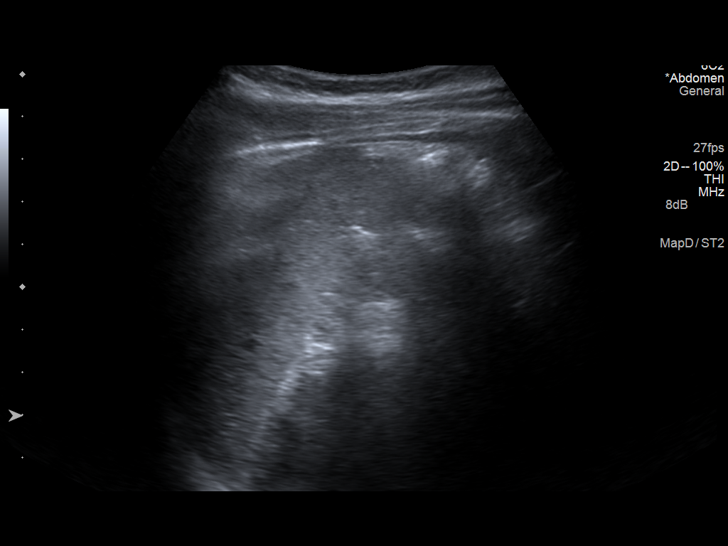

[13 of 25 positions shown; findings below may reference images not displayed]

FINDINGS: Gallbladder:

No gallstones or wall thickening visualized. No sonographic Murphy
sign noted.

Common bile duct:

Diameter: 4.6 mm

Liver:

The liver exhibits no focal mass or ductal dilation. Portal venous
flow is normal in direction toward the liver.

IVC:

No abnormality visualized.

Pancreas:

Visualized portion unremarkable.

Spleen:

Size and appearance within normal limits.

Right Kidney:

Length: 10.1 cm. There is minimal splitting of the central echo
complex on the right.. Echogenicity within normal limits. No mass
visualized.

Left Kidney:

Length: 10.5 cm. Echogenicity within normal limits. No mass or
hydronephrosis visualized.

Abdominal aorta:

There is no evidence of an abdominal aortic aneurysm.

Other findings:

No ascites is demonstrated.
IMPRESSION: 1. No acute abnormality of the gallbladder or common bile duct is
demonstrated.
2. The liver and pancreas and spleen exhibit no acute abnormalities.
3. There may be minimal hydronephrosis on the right versus an
extrarenal pelvis. The left kidney is normal in appearance.
4. There is no finding on this study to explain the patient's
symptoms.

## 2013-11-13 ENCOUNTER — Other Ambulatory Visit (HOSPITAL_COMMUNITY): Payer: Self-pay | Admitting: Internal Medicine

## 2013-11-13 DIAGNOSIS — R1011 Right upper quadrant pain: Secondary | ICD-10-CM

## 2013-11-18 ENCOUNTER — Encounter (HOSPITAL_COMMUNITY): Payer: Self-pay

## 2013-11-18 ENCOUNTER — Encounter (HOSPITAL_COMMUNITY)
Admission: RE | Admit: 2013-11-18 | Discharge: 2013-11-18 | Disposition: A | Discharge status: Home / Self Care | Payer: BC Managed Care – PPO | Source: Ambulatory Visit | Attending: Internal Medicine | Admitting: Internal Medicine

## 2013-11-18 DIAGNOSIS — R1011 Right upper quadrant pain: Secondary | ICD-10-CM | POA: Insufficient documentation

## 2013-11-18 IMAGING — NM NM HEPATO W/GB/PHARM/[PERSON_NAME]
2 series · 12 of 12 positions shown · non-contrast
Comparison: None.

CLINICAL DATA: Right upper quadrant abdominal pain

EXAM:
NUCLEAR MEDICINE HEPATOBILIARY IMAGING WITH GALLBLADDER EF
TECHNIQUE: Sequential images of the abdomen were obtained [DATE] minutes
following intravenous administration of radiopharmaceutical. After
slow intravenous infusion of 1.41 micrograms Cholecystokinin,
gallbladder ejection fraction was determined.
RADIOPHARMACEUTICALS:  5.0 [VP] Choletec

[hida · 3.20mm/px · 6 of 30 frames shown (1 of 2)]
[frame 3/30]
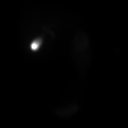
[frame 8/30]
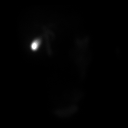
[frame 13/30]
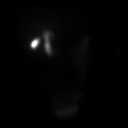
[frame 18/30]
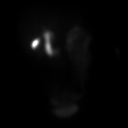
[frame 23/30]
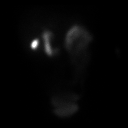
[frame 28/30]
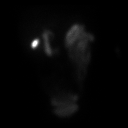

[hida · 3.20mm/px · 6 of 60 frames shown (2 of 2)]
[frame 6/60]
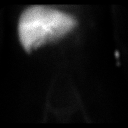
[frame 16/60]
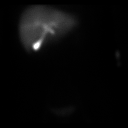
[frame 26/60]
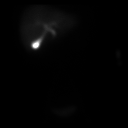
[frame 36/60]
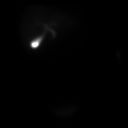
[frame 46/60]
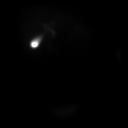
[frame 56/60]
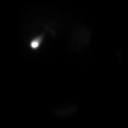

[12 of 12 positions shown; findings below may reference images not displayed]

FINDINGS: Gallbladder activity occurs after 9 min. Small bowel activity occurs
after 41 min. Gallbladder ejection fraction is 77%. At 30 min,
normal ejection fraction is greater than 30%.

The patient did experience abdominal pain and nausea symptoms during
CCK infusion.
IMPRESSION: Cystic and common bile ducts are patent. Gallbladder ejection
fraction is within normal limits.

## 2013-11-18 MED ORDER — STERILE WATER FOR INJECTION IJ SOLN
INTRAMUSCULAR | Status: AC
  Administered 2013-11-18: 5 mL via INTRAVENOUS
  Filled 2013-11-18: qty 10

## 2013-11-18 MED ORDER — TECHNETIUM TC 99M MEBROFENIN IV KIT
5.0000 | PACK | Freq: Once | INTRAVENOUS | Status: AC | PRN
  Administered 2013-11-18: 5 via INTRAVENOUS

## 2013-11-18 MED ORDER — SINCALIDE 5 MCG IJ SOLR
INTRAMUSCULAR | Status: AC
  Administered 2013-11-18: 1.41 ug via INTRAVENOUS
  Filled 2013-11-18: qty 5

## 2013-12-08 ENCOUNTER — Other Ambulatory Visit (HOSPITAL_COMMUNITY): Payer: Self-pay | Admitting: Internal Medicine

## 2013-12-08 DIAGNOSIS — R109 Unspecified abdominal pain: Secondary | ICD-10-CM

## 2013-12-10 ENCOUNTER — Ambulatory Visit (HOSPITAL_COMMUNITY)
Admission: RE | Admit: 2013-12-10 | Discharge: 2013-12-10 | Disposition: A | Discharge status: Home / Self Care | Payer: BC Managed Care – PPO | Source: Ambulatory Visit | Attending: Internal Medicine | Admitting: Internal Medicine

## 2013-12-10 DIAGNOSIS — R109 Unspecified abdominal pain: Secondary | ICD-10-CM

## 2013-12-10 DIAGNOSIS — K5732 Diverticulitis of large intestine without perforation or abscess without bleeding: Secondary | ICD-10-CM | POA: Insufficient documentation

## 2013-12-10 IMAGING — CT CT ABD-PELV W/ CM
2 of 5 series · 16 of 46 positions shown, 18 images · IV contrast (Omnipaque 300)
Comparison: [DATE] and [DATE]

CLINICAL DATA: Abdominal pain right-sided feels like previous
diverticulitis.

EXAM:
CT ABDOMEN AND PELVIS WITH CONTRAST
TECHNIQUE: Multidetector CT imaging of the abdomen and pelvis was performed
using the standard protocol following bolus administration of
intravenous contrast.
CONTRAST:  100mL OMNIPAQUE IOHEXOL 300 MG/ML  SOLN

[Series 2: abd_pel_with 5.0 b40f · axial · 0.66mm/px · z∈[-428,-88]mm · 13 of 78 slices shown, 15 images]
[im 5/78  soft-tissue]
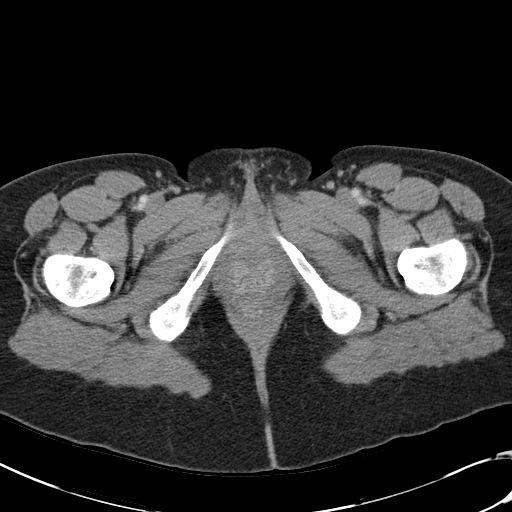
[im 5/78  bone]
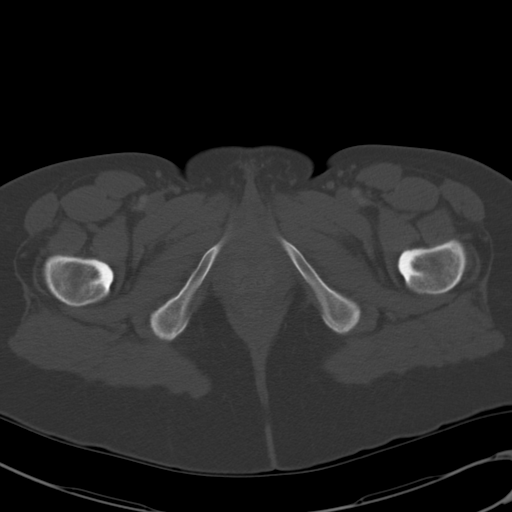
[im 13/78  soft-tissue]
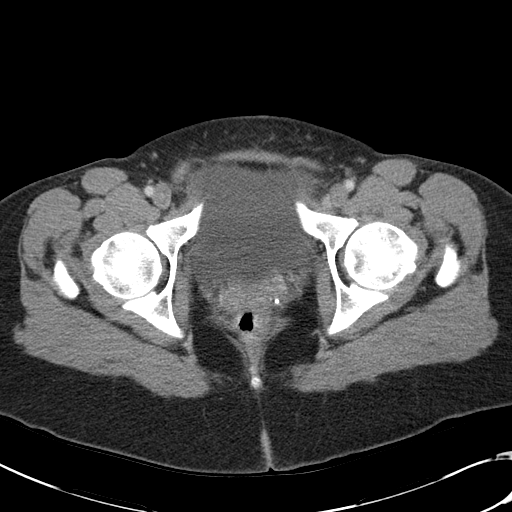
[im 17/78  soft-tissue]
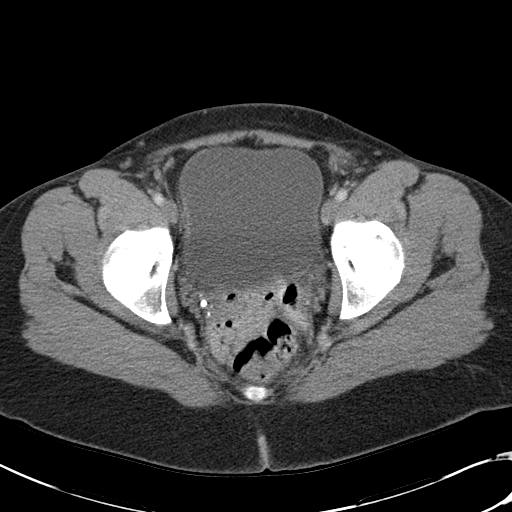
[im 21/78  soft-tissue]
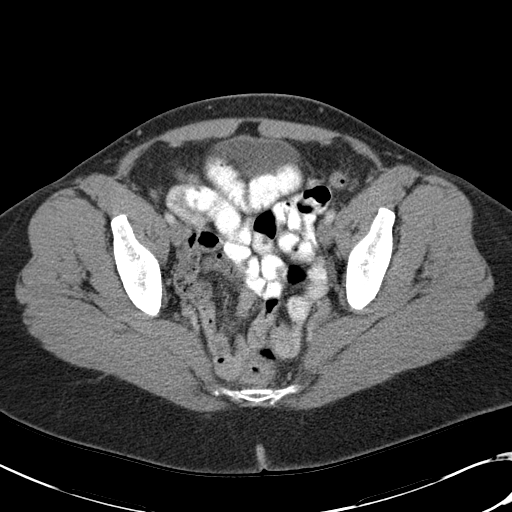
[im 29/78  soft-tissue]
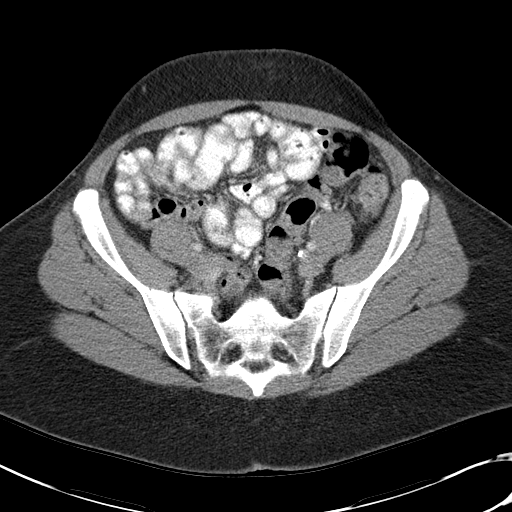
[im 33/78  soft-tissue]
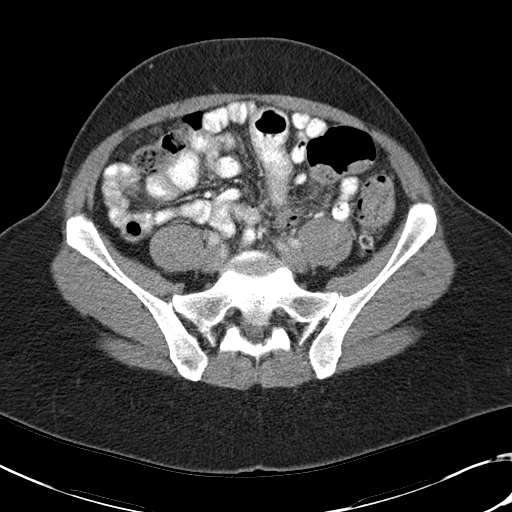
[im 41/78  soft-tissue]
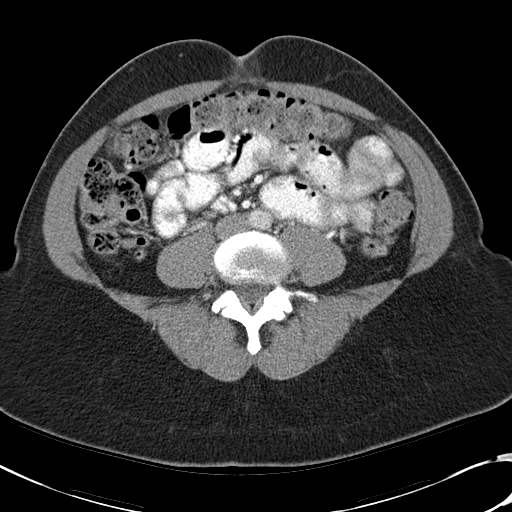
[im 45/78  soft-tissue]
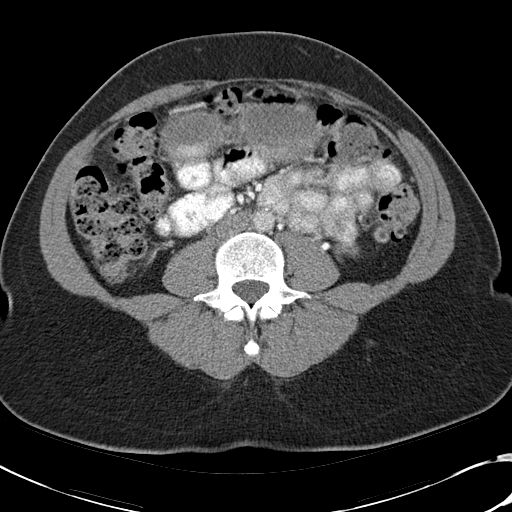
[im 49/78  soft-tissue]
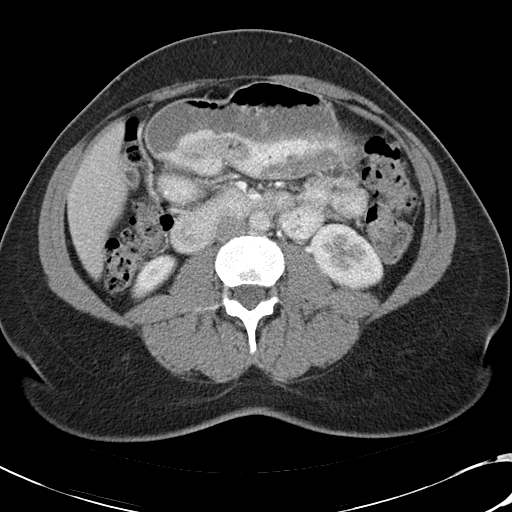
[im 49/78  bone]
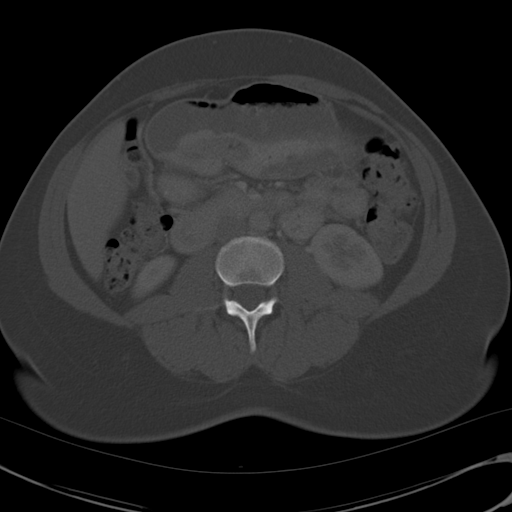
[im 57/78  soft-tissue]
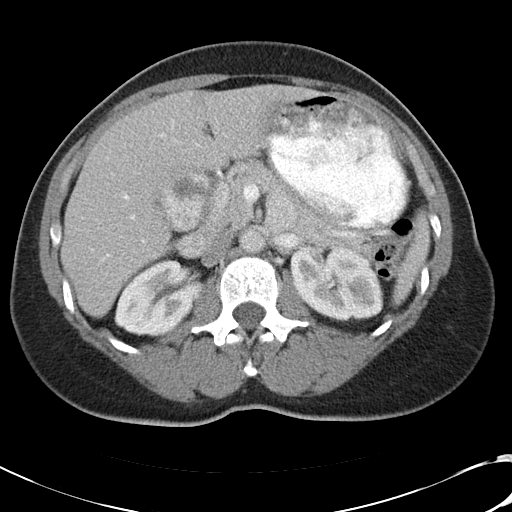
[im 61/78  soft-tissue]
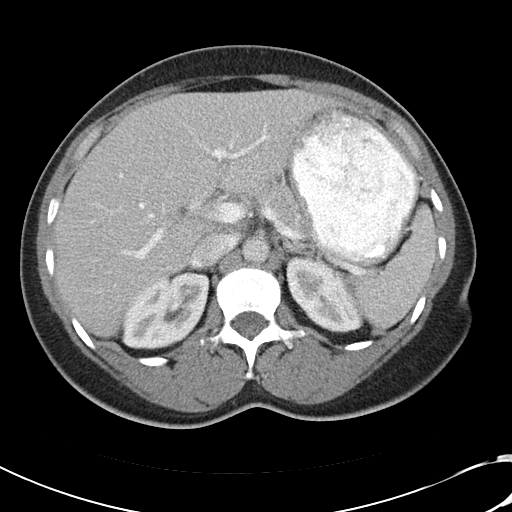
[im 65/78  soft-tissue]
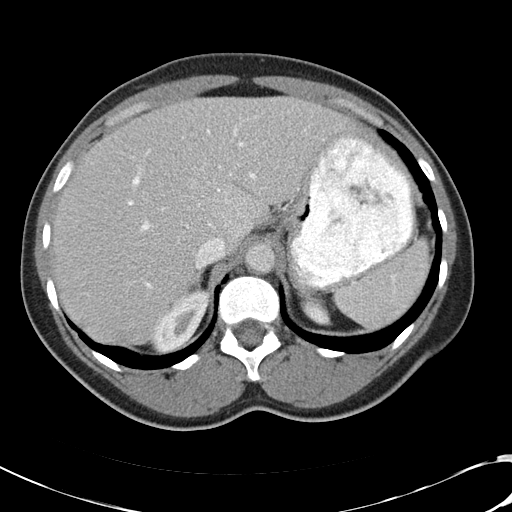
[im 73/78  soft-tissue]
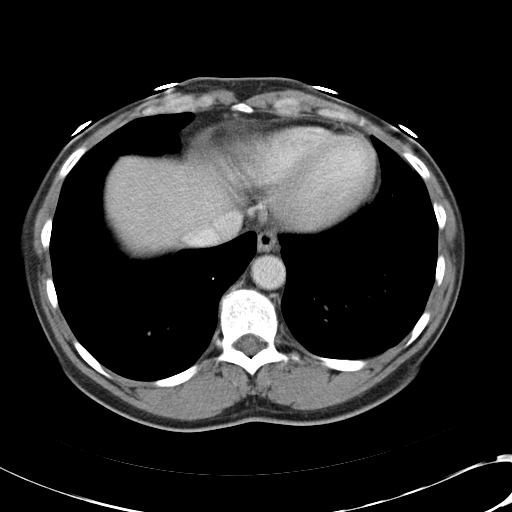

[Series 4: abd_pel_with 3.0 spo · coronal · 0.62mm/px · 3 of 81 slices shown]
[im 27/81  soft-tissue]
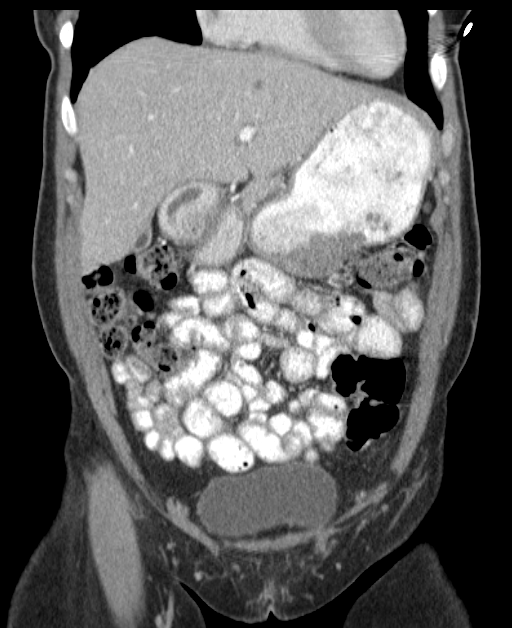
[im 36/81  soft-tissue]
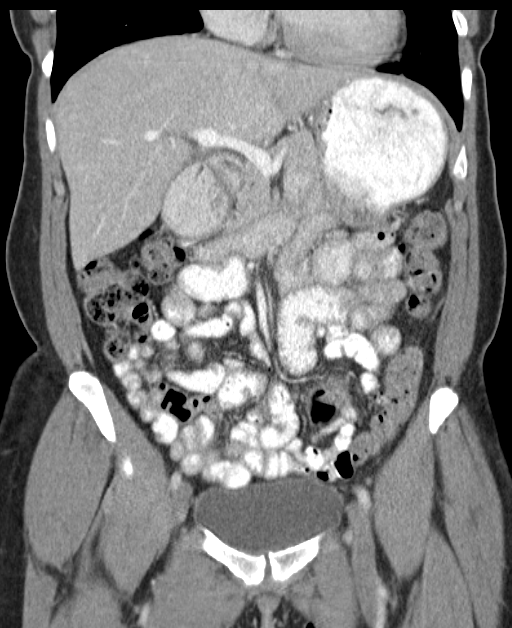
[im 45/81  soft-tissue]
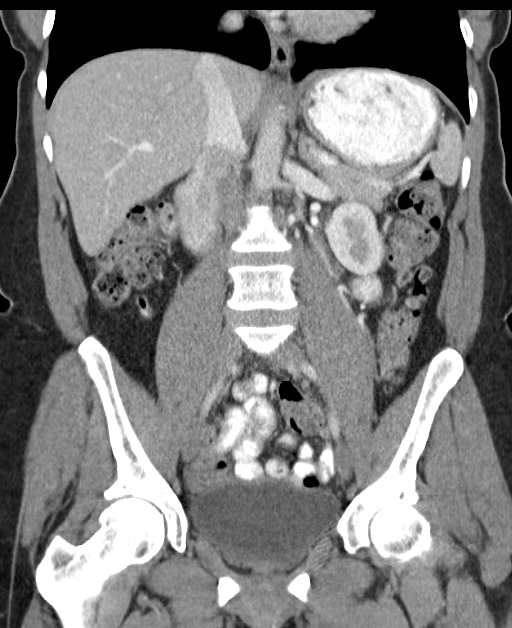

[16 of 46 positions shown; findings below may reference images not displayed]

FINDINGS: Lung bases are unremarkable.

Abdominal images demonstrate 3 small sub cm hypodensities over the
left lobe of the liver unchanged and likely a cyst. Gallbladder is
contracted. The spleen, pancreas and adrenal glands are within
normal. Kidneys normal size without hydronephrosis or
nephrolithiasis. There is minimal calcified plaque over the
abdominal aorta and iliac vessels. The appendix is normal. There is
moderate diverticulosis throughout the colon. There is no free fluid
or inflammatory change within the abdomen.

Pelvic images demonstrate mild bladder distention. There are
multiple phleboliths. There is surgical absence of the uterus. There
is no free pelvic fluid. There are mild degenerative changes of the
spine and hips.
IMPRESSION: No acute findings in the abdomen/pelvis.

Diverticulosis of the colon.

Three small sub cm hypodensities over the left lobe of the liver
unchanged likely cysts.

## 2013-12-10 MED ORDER — IOHEXOL 300 MG/ML  SOLN
100.0000 mL | Freq: Once | INTRAMUSCULAR | Status: AC | PRN
  Administered 2013-12-10: 100 mL via INTRAVENOUS

## 2013-12-24 ENCOUNTER — Encounter: Payer: Self-pay | Admitting: *Deleted

## 2014-01-30 ENCOUNTER — Ambulatory Visit: Payer: BC Managed Care – PPO | Admitting: Gastroenterology

## 2014-03-27 ENCOUNTER — Other Ambulatory Visit: Payer: Self-pay | Admitting: Adult Health

## 2014-07-09 ENCOUNTER — Other Ambulatory Visit: Payer: Self-pay | Admitting: Obstetrics and Gynecology

## 2015-05-04 ENCOUNTER — Other Ambulatory Visit (HOSPITAL_COMMUNITY): Payer: Self-pay | Admitting: Family Medicine

## 2015-05-04 DIAGNOSIS — Z139 Encounter for screening, unspecified: Secondary | ICD-10-CM

## 2015-05-06 ENCOUNTER — Other Ambulatory Visit (HOSPITAL_COMMUNITY): Payer: Self-pay | Admitting: Family Medicine

## 2015-05-07 ENCOUNTER — Ambulatory Visit (HOSPITAL_COMMUNITY): Payer: Self-pay

## 2015-05-11 ENCOUNTER — Other Ambulatory Visit (HOSPITAL_COMMUNITY): Payer: Self-pay | Admitting: Internal Medicine

## 2015-05-11 ENCOUNTER — Other Ambulatory Visit (HOSPITAL_COMMUNITY): Payer: Self-pay | Admitting: Family Medicine

## 2015-05-11 ENCOUNTER — Encounter (HOSPITAL_COMMUNITY): Payer: Self-pay

## 2015-05-11 ENCOUNTER — Ambulatory Visit (HOSPITAL_COMMUNITY)
Admission: RE | Admit: 2015-05-11 | Discharge: 2015-05-11 | Disposition: A | Discharge status: Home / Self Care | Payer: BLUE CROSS/BLUE SHIELD | Source: Ambulatory Visit | Attending: Internal Medicine | Admitting: Internal Medicine

## 2015-05-11 ENCOUNTER — Ambulatory Visit (HOSPITAL_COMMUNITY)
Admission: RE | Admit: 2015-05-11 | Discharge: 2015-05-11 | Disposition: A | Discharge status: Home / Self Care | Payer: BLUE CROSS/BLUE SHIELD | Source: Ambulatory Visit | Attending: Family Medicine | Admitting: Family Medicine

## 2015-05-11 DIAGNOSIS — N6310 Unspecified lump in the right breast, unspecified quadrant: Secondary | ICD-10-CM

## 2015-05-11 DIAGNOSIS — N63 Unspecified lump in breast: Secondary | ICD-10-CM | POA: Diagnosis not present

## 2015-05-11 IMAGING — MG MM DIAG BREAST TOMO BILATERAL
6 of 9 series · 6 of 25 positions shown · non-contrast
Comparison: Previous exam(s).

CLINICAL DATA: Small mass felt on recent physical examination and
by the patient in the upper outer retroareolar right breast.

EXAM:
DIGITAL DIAGNOSTIC RIGHT MAMMOGRAM WITH 3D TOMOSYNTHESIS WITH CAD
ULTRASOUND RIGHT BREAST

[R TAN]
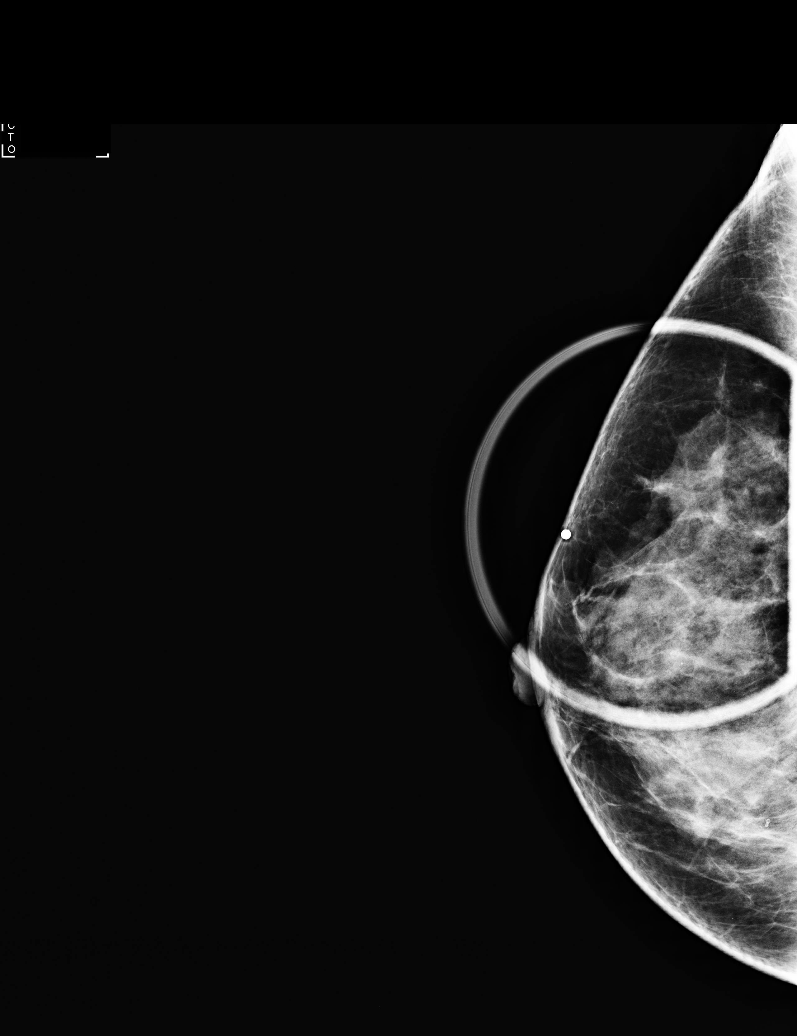

[L MLO]
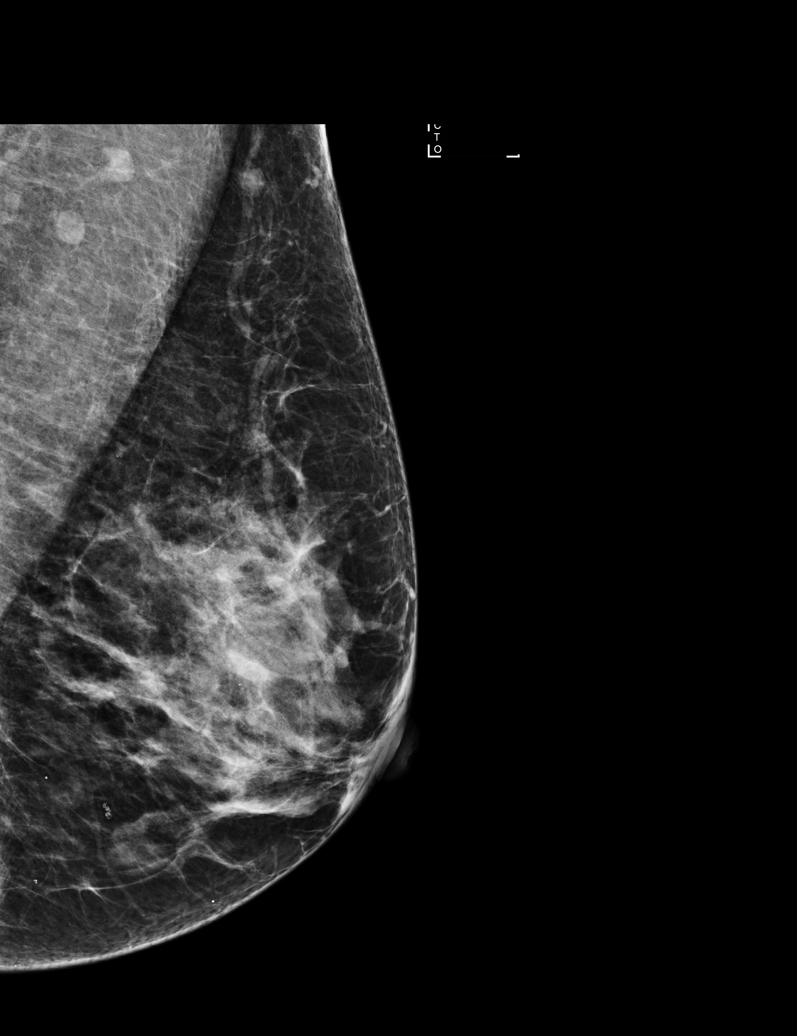

[R CC]
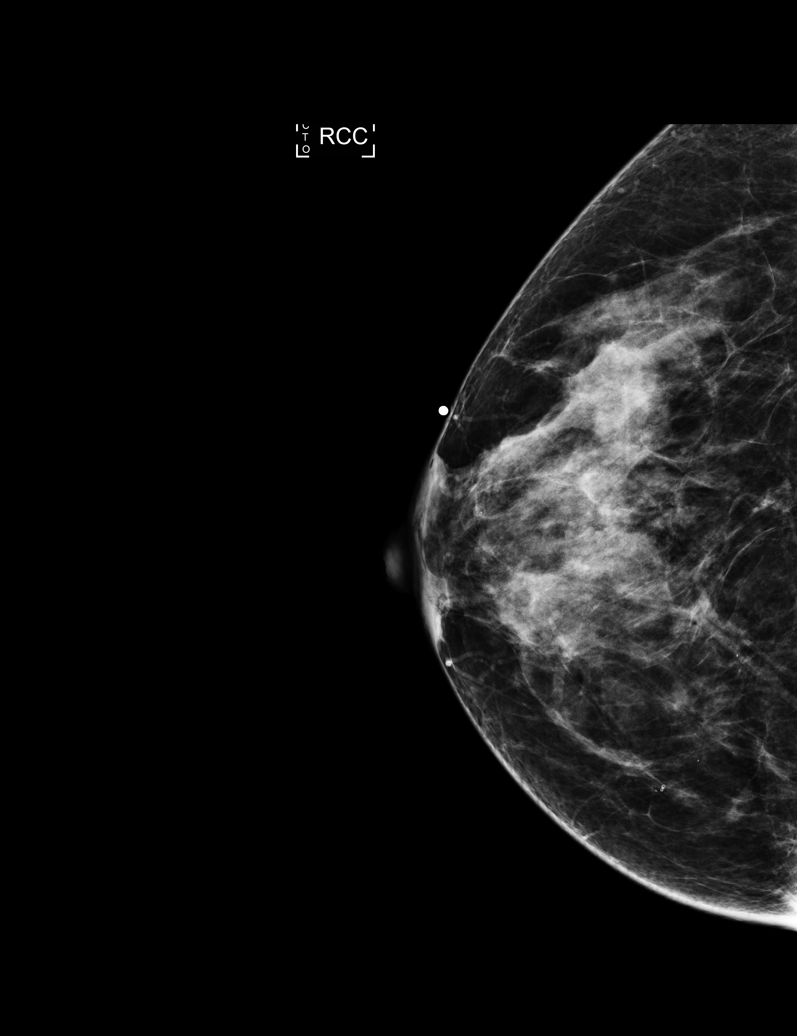

[R MLO]
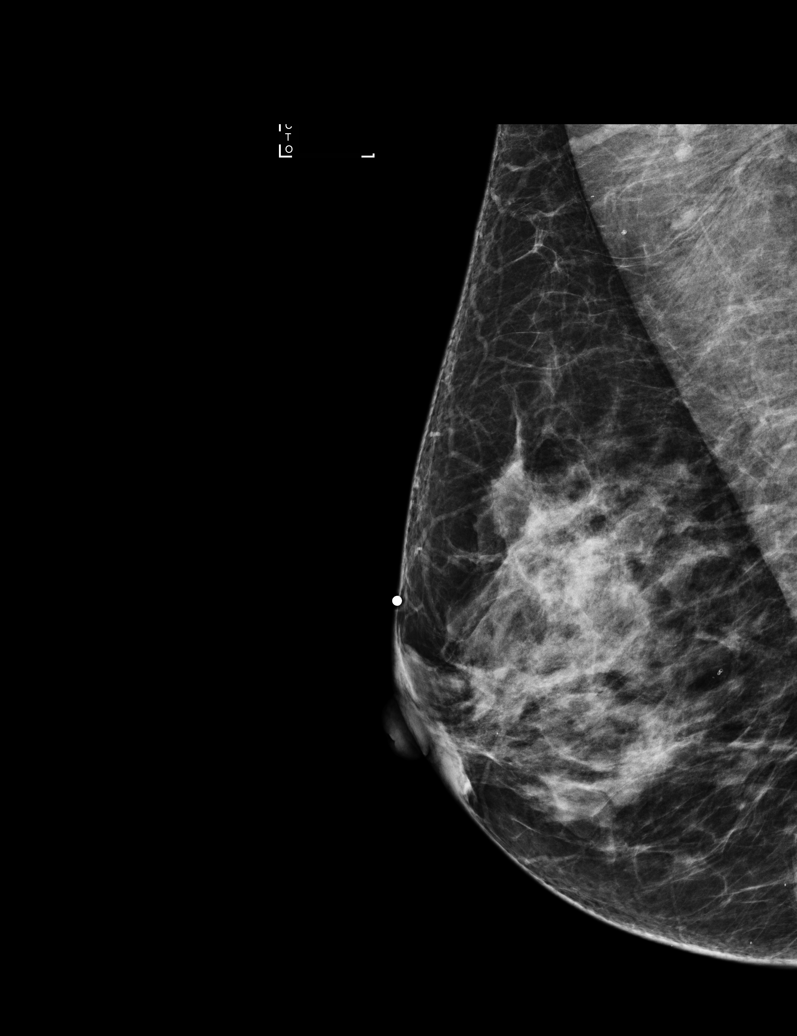

[L CC]
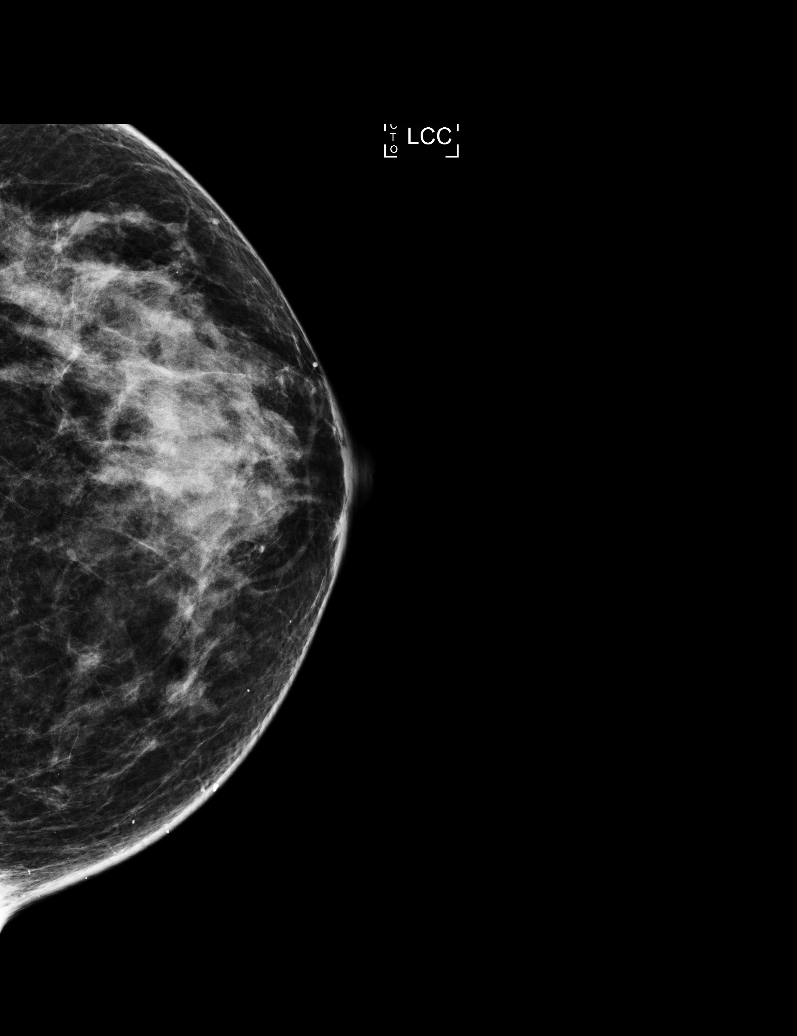

[R MLO tomo · tomo slice 30/59.0]
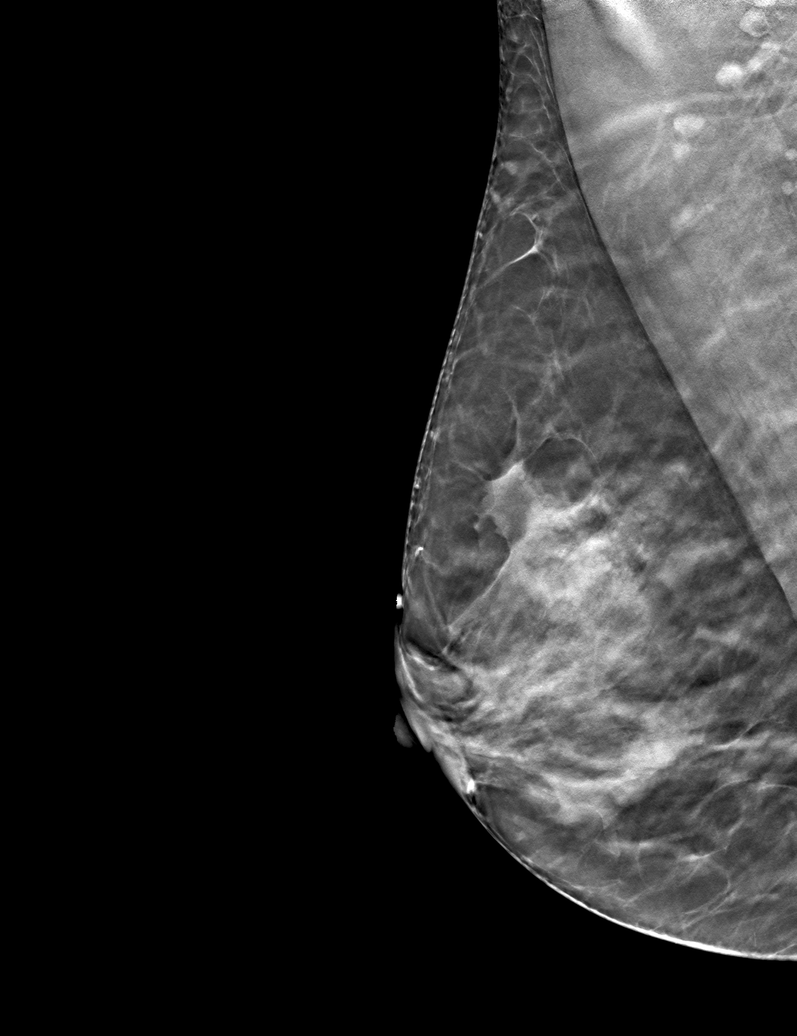

[6 of 25 positions shown; findings below may reference images not displayed]

ACR Breast Density Category c: The breast tissue is heterogeneously
dense, which may obscure small masses.
FINDINGS: 3D tomographic images of both breasts demonstrate normal appearing
breast tissue bilaterally without significant change compared to
previous examinations. No findings suspicious for malignancy in
either breast.

Mammographic images were processed with CAD.

On physical exam, the patient has an approximately 3 mm rounded,
circumscribed, palpable mass in the 10 o'clock retroareolar right
breast.

Targeted ultrasound is performed, showing normal appearing dense
glandular tissue in the area of palpable concern. This includes
glandular tissue with a convex anterior margin extending just
beneath the skin, corresponding to the palpable mass. No true mass
was seen.
IMPRESSION: No evidence of malignancy.

RECOMMENDATION:
Bilateral screening mammogram in 1 year.

I have discussed the findings and recommendations with the patient.
Results were also provided in writing at the conclusion of the
visit. If applicable, a reminder letter will be sent to the patient
regarding the next appointment.

BI-RADS CATEGORY  1: Negative.

## 2015-05-11 IMAGING — US US BREAST LTD UNI RIGHT INC AXILLA
1 series · 2 of 2 positions shown · non-contrast
Comparison: Previous exam(s).

CLINICAL DATA: Small mass felt on recent physical examination and
by the patient in the upper outer retroareolar right breast.

EXAM:
DIGITAL DIAGNOSTIC RIGHT MAMMOGRAM WITH 3D TOMOSYNTHESIS WITH CAD
ULTRASOUND RIGHT BREAST

[Series 1: us breast ltd uni right inc axilla · 0.07mm/px · 2 of 2 slices shown]
[im 1/2]
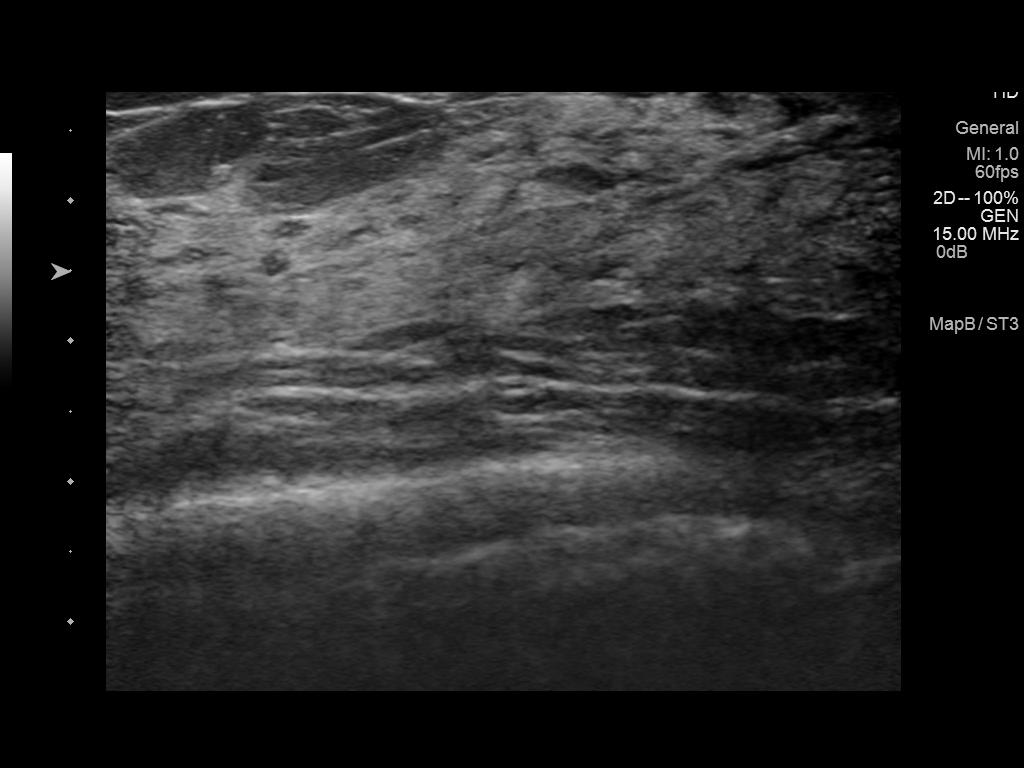
[im 2/2]
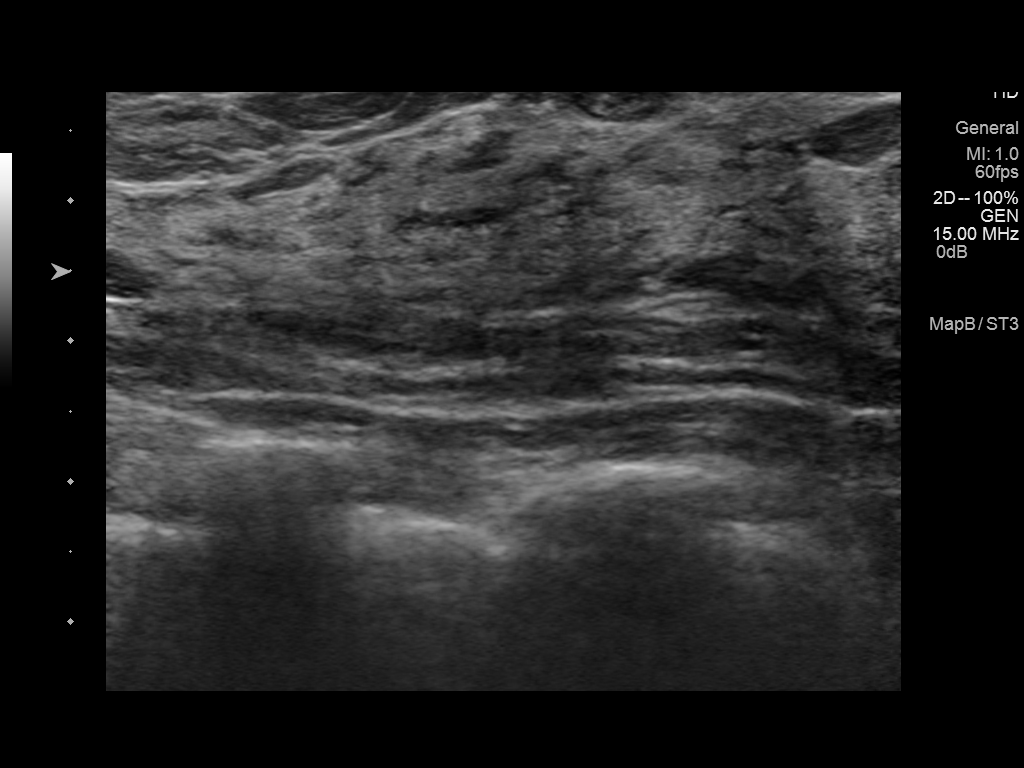

[2 of 2 positions shown; findings below may reference images not displayed]

ACR Breast Density Category c: The breast tissue is heterogeneously
dense, which may obscure small masses.
FINDINGS: 3D tomographic images of both breasts demonstrate normal appearing
breast tissue bilaterally without significant change compared to
previous examinations. No findings suspicious for malignancy in
either breast.

Mammographic images were processed with CAD.

On physical exam, the patient has an approximately 3 mm rounded,
circumscribed, palpable mass in the 10 o'clock retroareolar right
breast.

Targeted ultrasound is performed, showing normal appearing dense
glandular tissue in the area of palpable concern. This includes
glandular tissue with a convex anterior margin extending just
beneath the skin, corresponding to the palpable mass. No true mass
was seen.
IMPRESSION: No evidence of malignancy.

RECOMMENDATION:
Bilateral screening mammogram in 1 year.

I have discussed the findings and recommendations with the patient.
Results were also provided in writing at the conclusion of the
visit. If applicable, a reminder letter will be sent to the patient
regarding the next appointment.

BI-RADS CATEGORY  1: Negative.

## 2015-06-03 ENCOUNTER — Other Ambulatory Visit: Payer: Self-pay | Admitting: Adult Health

## 2016-08-14 ENCOUNTER — Other Ambulatory Visit (HOSPITAL_COMMUNITY): Payer: Self-pay | Admitting: Registered Nurse

## 2016-08-14 DIAGNOSIS — Z1231 Encounter for screening mammogram for malignant neoplasm of breast: Secondary | ICD-10-CM

## 2016-08-21 ENCOUNTER — Ambulatory Visit (HOSPITAL_COMMUNITY): Payer: BLUE CROSS/BLUE SHIELD

## 2017-01-04 ENCOUNTER — Other Ambulatory Visit (HOSPITAL_COMMUNITY): Payer: Self-pay | Admitting: Registered Nurse

## 2017-01-04 DIAGNOSIS — Z1231 Encounter for screening mammogram for malignant neoplasm of breast: Secondary | ICD-10-CM

## 2017-01-18 ENCOUNTER — Ambulatory Visit (HOSPITAL_COMMUNITY): Payer: BLUE CROSS/BLUE SHIELD

## 2017-02-01 ENCOUNTER — Other Ambulatory Visit (HOSPITAL_COMMUNITY): Payer: Self-pay | Admitting: Internal Medicine

## 2017-02-01 ENCOUNTER — Ambulatory Visit (HOSPITAL_COMMUNITY)
Admission: RE | Admit: 2017-02-01 | Discharge: 2017-02-01 | Disposition: A | Discharge status: Home / Self Care | Payer: BLUE CROSS/BLUE SHIELD | Source: Ambulatory Visit | Attending: Internal Medicine | Admitting: Internal Medicine

## 2017-02-01 DIAGNOSIS — Z1231 Encounter for screening mammogram for malignant neoplasm of breast: Secondary | ICD-10-CM | POA: Diagnosis not present

## 2017-02-01 IMAGING — MG DIGITAL SCREENING BILATERAL MAMMOGRAM WITH CAD
5 series · 5 of 5 positions shown · non-contrast
Comparison: Previous exam(s).

CLINICAL DATA: Screening.

EXAM:
DIGITAL SCREENING BILATERAL MAMMOGRAM WITH CAD

[L CC (1 of 2)]
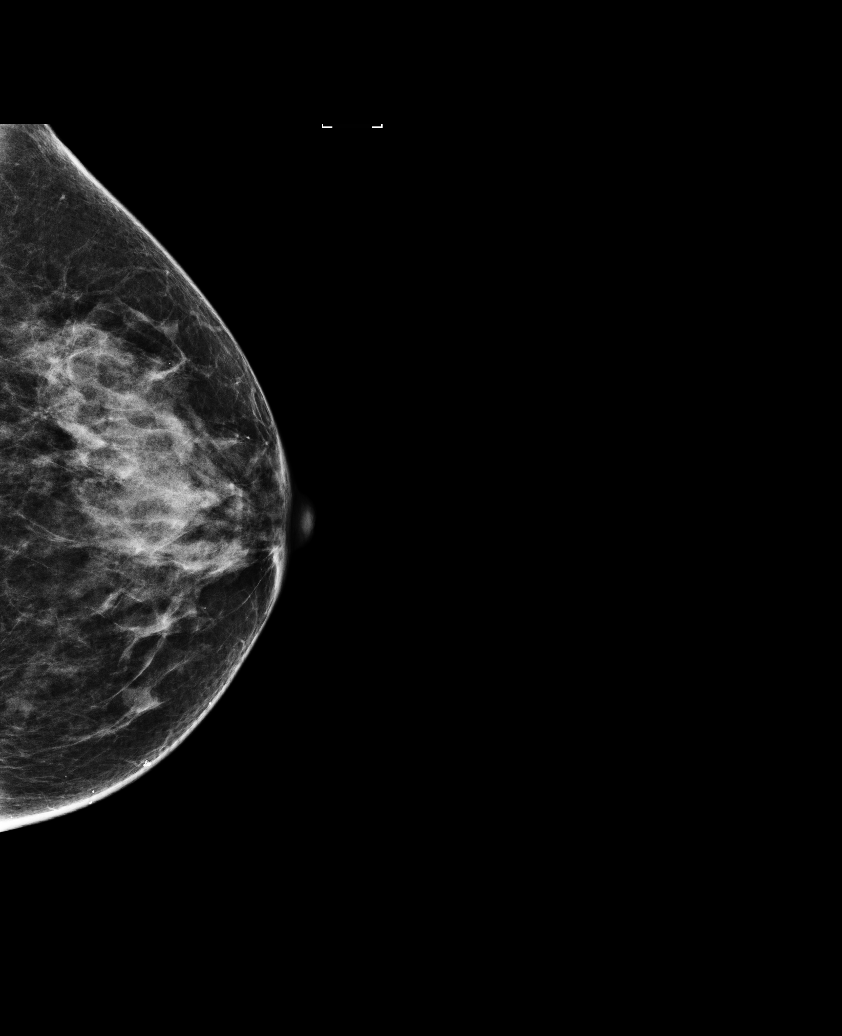

[R CC]
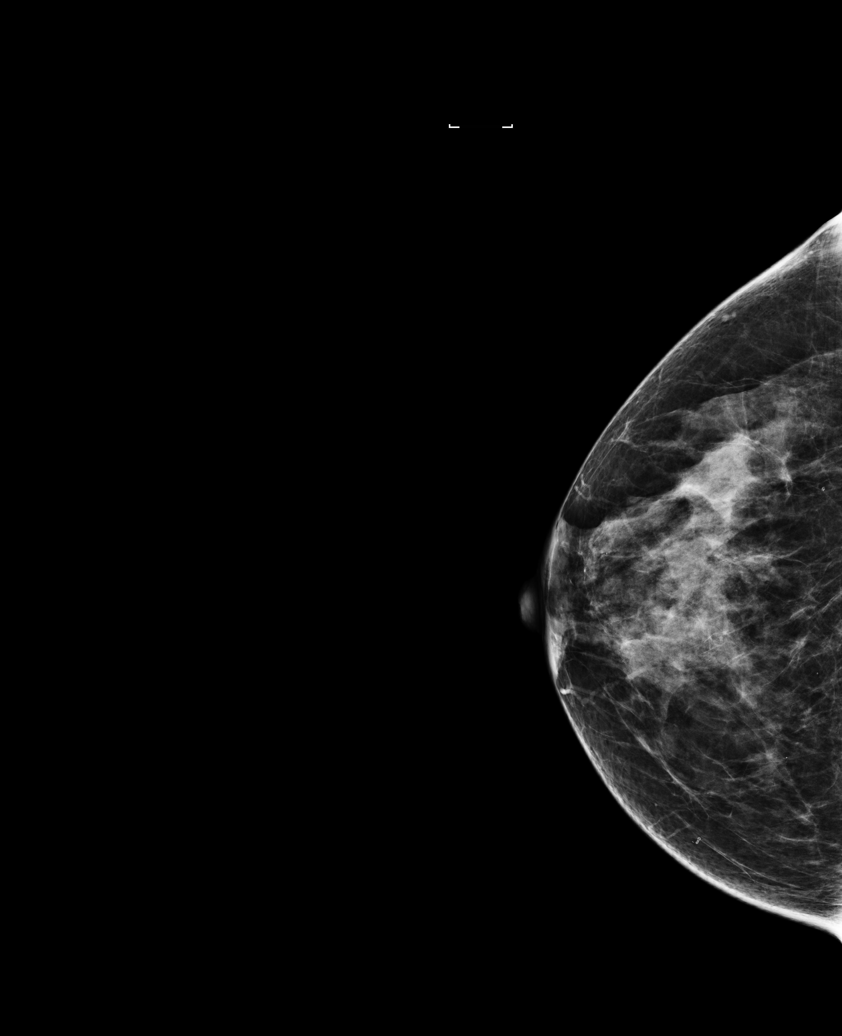

[L CC (2 of 2)]
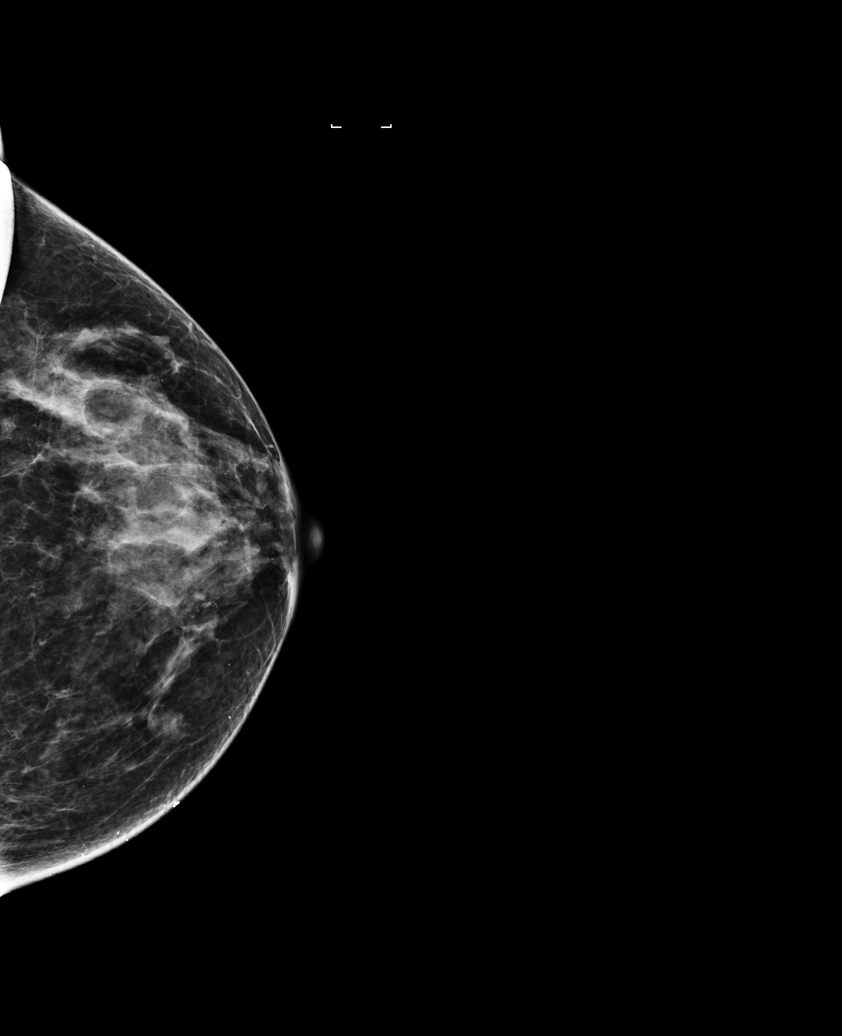

[L MLO]
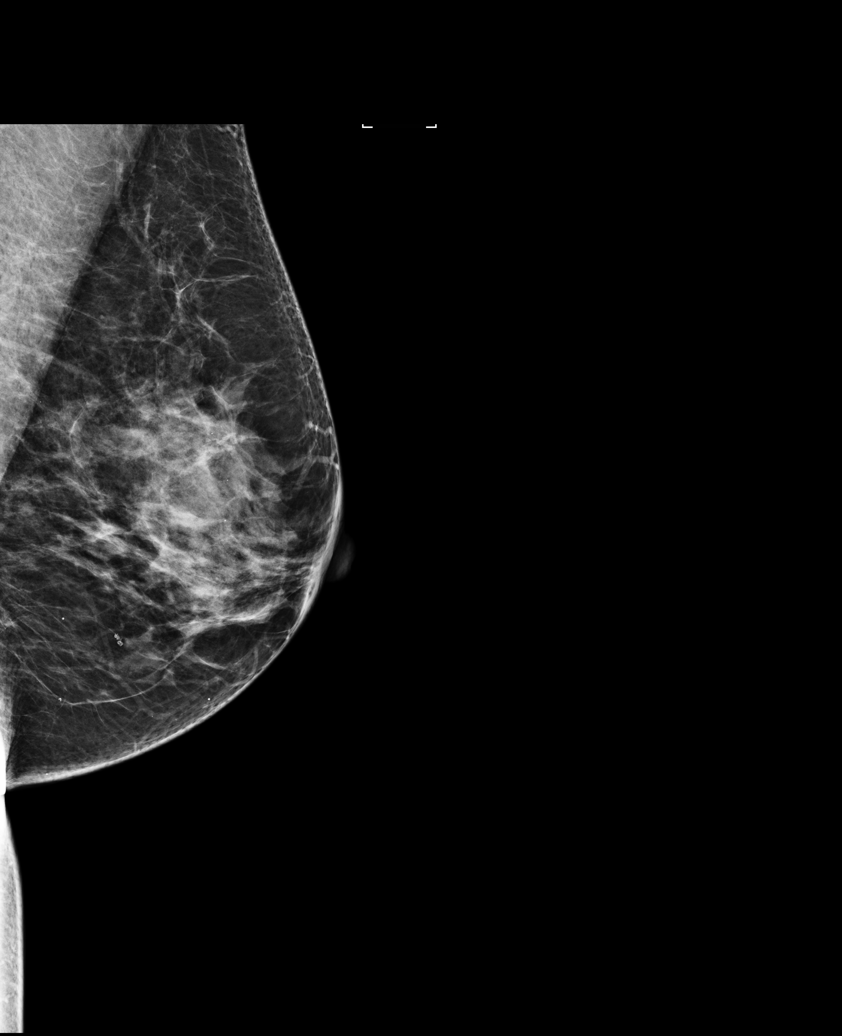

[R MLO]
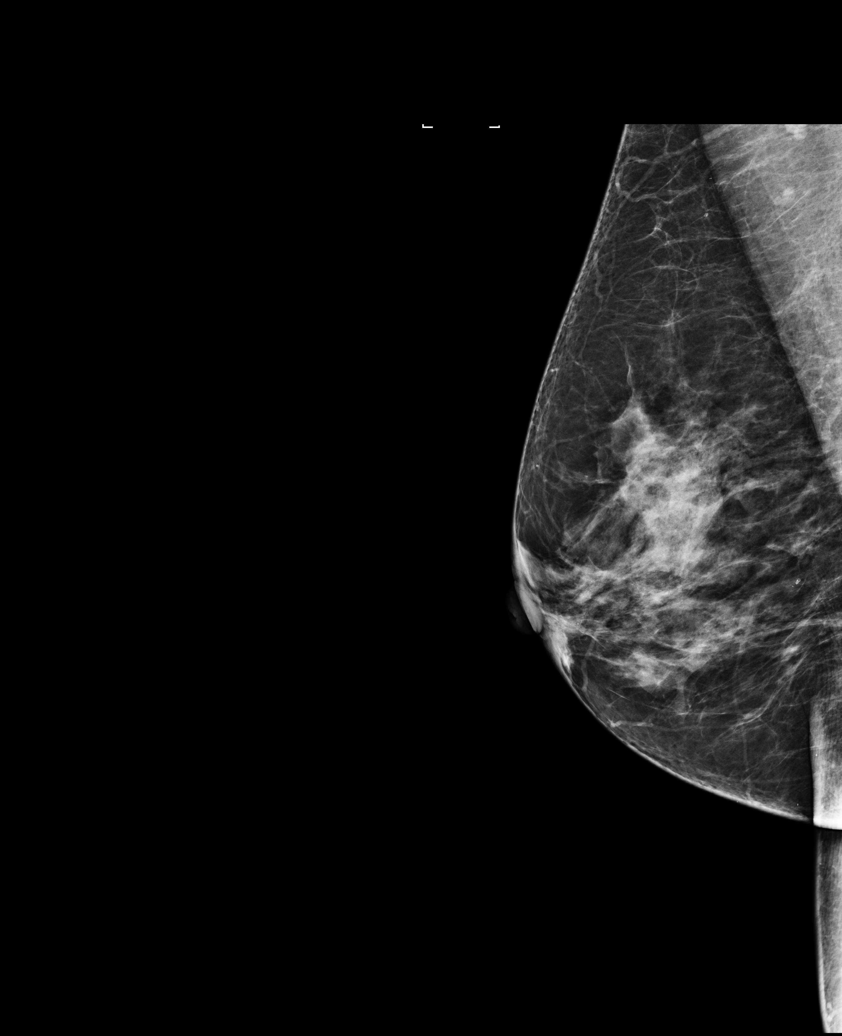

[5 of 5 positions shown; findings below may reference images not displayed]

ACR Breast Density Category c: The breast tissue is heterogeneously
dense, which may obscure small masses.
FINDINGS: There are no findings suspicious for malignancy. Images were
processed with CAD.
IMPRESSION: No mammographic evidence of malignancy. A result letter of this
screening mammogram will be mailed directly to the patient.

RECOMMENDATION:
Screening mammogram in one year. (Code:[0J])

BI-RADS CATEGORY  1: Negative.

## 2017-03-19 ENCOUNTER — Encounter: Payer: Self-pay | Admitting: *Deleted

## 2017-03-19 ENCOUNTER — Telehealth: Payer: Self-pay | Admitting: Cardiovascular Disease

## 2017-03-19 ENCOUNTER — Ambulatory Visit (INDEPENDENT_AMBULATORY_CARE_PROVIDER_SITE_OTHER): Payer: BLUE CROSS/BLUE SHIELD | Admitting: Cardiovascular Disease

## 2017-03-19 VITALS — BP 158/92 | HR 68 | Ht 63.0 in | Wt 163.0 lb

## 2017-03-19 DIAGNOSIS — R002 Palpitations: Secondary | ICD-10-CM | POA: Diagnosis not present

## 2017-03-19 DIAGNOSIS — E785 Hyperlipidemia, unspecified: Secondary | ICD-10-CM

## 2017-03-19 DIAGNOSIS — R079 Chest pain, unspecified: Secondary | ICD-10-CM | POA: Diagnosis not present

## 2017-03-19 DIAGNOSIS — I10 Essential (primary) hypertension: Secondary | ICD-10-CM

## 2017-03-19 NOTE — Telephone Encounter (Signed)
Pre-cert Verification for the following procedure   Exercise Myoview & Echo scheduled for 03-29-17

## 2017-03-19 NOTE — Patient Instructions (Signed)
Medication Instructions:  Continue all current medications.  Labwork: none  Testing/Procedures:  Your physician has requested that you have an echocardiogram. Echocardiography is a painless test that uses sound waves to create images of your heart. It provides your doctor with information about the size and shape of your heart and how well your heart's chambers and valves are working. This procedure takes approximately one hour. There are no restrictions for this procedure.  Your physician has requested that you have en exercise stress myoview. For further information please visit [email or web address]. Please follow instruction sheet, as given.  Office will contact with results via phone or letter.    Follow-Up: 6 weeks   Any Other Special Instructions Will Be Listed Below (If Applicable).  If you need a refill on your cardiac medications before your next appointment, please call your pharmacy.

## 2017-03-19 NOTE — Progress Notes (Signed)
CARDIOLOGY CONSULT NOTE  Patient ID: Diane Parker MRN: [id number] DOB/AGE: [birth year] 61 y.o.  Admit date: (Not on file) Primary Physician: Redmond School, MD Referring Physician: Gerarda Fraction  Reason for Consultation: palpitations and chest pain  HPI: Diane Parker is a 61 y.o. female who is being seen today for the evaluation of palpitations and chest pain at the request of Redmond School, MD.   She has a history of hypertension and hyperlipidemia.  ECG performed in the office today which I ordered and personally interpreted demonstrates normal sinus rhythm with no ischemic ST segment or T-wave abnormalities, nor any arrhythmias.  She has been experiencing chest pain intermittently for the past one year. It primarily radiates to the right arm but can radiate to the bilateral inframammary region. She notices it when mowing her lawn. It resolves with aspirin and rest. It is accompanied by shortness of breath. She also describes rapid palpitations which have occasionally awoken her at night. She has associated lightheadedness and dizziness but denies syncope.  She checks her blood pressure at home and at [facility]. It runs in the 130/80-90 range.  She denies leg swelling, orthopnea, paroxysmal nocturnal dyspnea.  She quit smoking in the 1970s.  Family history: Her father had coronary artery disease at an early age and died in his 55s. Her mother died of a brain aneurysm at age 21.   No Known Allergies  Current Outpatient Prescriptions  Medication Sig Dispense Refill  . amLODipine (NORVASC) 10 MG tablet Take 10 mg by mouth daily.    Marland Kitchen aspirin 81 MG tablet Take 81 mg by mouth daily.    Marland Kitchen atenolol-chlorthalidone (TENORETIC) 50-25 MG per tablet Take 1 tablet by mouth daily.    . diclofenac sodium (VOLTAREN) 1 % GEL Apply topically 4 (four) times daily.    Marland Kitchen estradiol (ESTRACE) 2 MG tablet TAKE ONE TABLET BY MOUTH EVERY DAY 30 tablet 6  . montelukast (SINGULAIR) 10 MG tablet  Take 10 mg by mouth at bedtime.    . Multiple Vitamin (MULTIVITAMIN WITH MINERALS) TABS tablet Take 1 tablet by mouth daily.    . Multiple Vitamins-Minerals (HAIR/SKIN/NAILS PO) Take 1 tablet by mouth daily.    . polyethylene glycol-electrolytes (TRILYTE) 420 G solution Take 4,000 mLs by mouth as directed. 4000 mL 0  . potassium chloride (KLOR-CON) 20 MEQ packet Take 20 mEq by mouth daily.    . rosuvastatin (CRESTOR) 10 MG tablet Take 10 mg by mouth daily.    . sertraline (ZOLOFT) 25 MG tablet Take 25 mg by mouth daily as needed (Hot Flashes).    . traMADol (ULTRAM) 50 MG tablet Take 50 mg by mouth as needed for pain.    Marland Kitchen UNABLE TO FIND ASA 500MG  AND CAFFEINE  32.5MG  AS NEEDED     No current facility-administered medications for this visit.     Past Medical History:  Diagnosis Date  . Acute diverticulitis 03/28/2013   Seen on CT taking flagyl an cipro and feels better  . Arthritis   . GERD (gastroesophageal reflux disease)   . History of diverticulosis 03/26/2013  . Hypertension   . LLQ pain 03/26/2013   With nausea and diarrhea and some constipation    Past Surgical History:  Procedure Laterality Date  . ABDOMINAL HYSTERECTOMY    . COLONOSCOPY  11/08/2007   SFK:[id number], rectosigmoid junction/hyperplastic-appearing polypoid    mucosa, 1 of these areas biopsied.  Otherwise, normal rectum/Left-sided transverse diverticula/Diminutive polyp just distal to the ileocecal  valve. Hyperplastic and adenomatous polyp.   . COLONOSCOPY N/A 06/26/2013   WUJ:[id number] diverticulosis. Colonic polyps removed  . ESOPHAGOGASTRODUODENOSCOPY  2008   RMR: normal  . TUBAL LIGATION      Social History   Social History  . Marital status: Single    Spouse name: N/A  . Number of children: N/A  . Years of education: N/A   Occupational History  . Not on file.   Social History Main Topics  . Smoking status: Former Smoker    Types: Cigarettes  . Smokeless tobacco: Never Used  . Alcohol use Yes       Comment: beer occassionally  . Drug use: No  . Sexual activity: No   Other Topics Concern  . Not on file   Social History Narrative  . No narrative on file      Current Meds  Medication Sig  . amLODipine (NORVASC) 10 MG tablet Take 10 mg by mouth daily.  Marland Kitchen aspirin 81 MG tablet Take 81 mg by mouth daily.  Marland Kitchen atenolol-chlorthalidone (TENORETIC) 50-25 MG per tablet Take 1 tablet by mouth daily.  . diclofenac sodium (VOLTAREN) 1 % GEL Apply topically 4 (four) times daily.  Marland Kitchen estradiol (ESTRACE) 2 MG tablet TAKE ONE TABLET BY MOUTH EVERY DAY  . montelukast (SINGULAIR) 10 MG tablet Take 10 mg by mouth at bedtime.  . Multiple Vitamin (MULTIVITAMIN WITH MINERALS) TABS tablet Take 1 tablet by mouth daily.  . Multiple Vitamins-Minerals (HAIR/SKIN/NAILS PO) Take 1 tablet by mouth daily.  . polyethylene glycol-electrolytes (TRILYTE) 420 G solution Take 4,000 mLs by mouth as directed.  . potassium chloride (KLOR-CON) 20 MEQ packet Take 20 mEq by mouth daily.  . rosuvastatin (CRESTOR) 10 MG tablet Take 10 mg by mouth daily.  . sertraline (ZOLOFT) 25 MG tablet Take 25 mg by mouth daily as needed (Hot Flashes).  . traMADol (ULTRAM) 50 MG tablet Take 50 mg by mouth as needed for pain.  Marland Kitchen UNABLE TO FIND ASA 500MG  AND CAFFEINE  32.5MG  AS NEEDED  . [DISCONTINUED] Acetaminophen-Caff-Pyrilamine (MIDOL COMPLETE PO) Take 1 tablet by mouth every 6 (six) hours as needed (Pain).   . [DISCONTINUED] sertraline (ZOLOFT) 25 MG tablet TAKE 1 TABLET DAILY      Review of systems complete and found to be negative unless listed above in HPI    Physical exam Blood pressure (!) 158/92, pulse 68, height 5\' 3"  (1.6 m), weight 163 lb (73.9 kg), SpO2 98 %. General: NAD Neck: No JVD, no thyromegaly or thyroid nodule.  Lungs: Clear to auscultation bilaterally with normal respiratory effort. CV: Nondisplaced PMI. Regular rate and rhythm, normal S1/S2, no S3/S4, no murmur.  No peripheral edema.  No carotid bruit.     Abdomen: Soft, nontender, no distention.  Skin: Intact without lesions or rashes.  Neurologic: Alert and oriented x 3.  Psych: Normal affect. Extremities: No clubbing or cyanosis.  HEENT: Normal.   ECG: Most recent ECG reviewed.   Labs: Lab Results  Component Value Date/Time   K 4.4 03/26/2013 03:38 PM   BUN 17 03/26/2013 03:38 PM   CREATININE 0.99 03/26/2013 03:38 PM   ALT 11 03/26/2013 03:38 PM   HGB 11.3 (L) 03/26/2013 03:38 PM     Lipids: No results found for: LDLCALC, LDLDIRECT, CHOL, TRIG, HDL      ASSESSMENT AND PLAN:  1. Chest pain: Cardiovascular risk factors include hypertension and hyperlipidemia. Her father may have had premature coronary artery disease. Resting ECG is normal. I will proceed  with a nuclear myocardial perfusion imaging study to evaluate for ischemic heart disease (exercise Myoview). I will order a 2-D echocardiogram with Doppler to evaluate cardiac structure, function, and regional wall motion.  2. Palpitations: I will obtain a stress test and evaluate for exercise-induced arrhythmias. I will order a 2-D echocardiogram with Doppler to evaluate cardiac structure, function, and regional wall motion.  3. Hypertension: Markedly elevated. I have asked the patient to check blood pressure readings 4 times per week, at different times throughout the day, in order to get a better approximation of mean BP values. These results will be provided to me at the end of that period so that I can determine if antihypertensive medication titration is indicated.  4. Hyperlipidemia: Continue statin.   Disposition: Follow up in 6 weeks  Signed: Kate Sable, M.D., F.A.C.C.  03/19/2017, 2:01 PM

## 2017-03-29 ENCOUNTER — Other Ambulatory Visit: Payer: Self-pay

## 2017-03-29 ENCOUNTER — Encounter (HOSPITAL_COMMUNITY)
Admission: RE | Admit: 2017-03-29 | Discharge: 2017-03-29 | Disposition: A | Discharge status: Home / Self Care | Payer: BLUE CROSS/BLUE SHIELD | Source: Ambulatory Visit | Attending: Cardiovascular Disease | Admitting: Cardiovascular Disease

## 2017-03-29 ENCOUNTER — Ambulatory Visit (HOSPITAL_COMMUNITY)
Admission: RE | Admit: 2017-03-29 | Discharge: 2017-03-29 | Disposition: A | Discharge status: Home / Self Care | Payer: BLUE CROSS/BLUE SHIELD | Source: Ambulatory Visit | Attending: Cardiovascular Disease | Admitting: Cardiovascular Disease

## 2017-03-29 ENCOUNTER — Inpatient Hospital Stay (HOSPITAL_COMMUNITY)
Admission: EM | Admit: 2017-03-29 | Discharge: 2017-03-30 | DRG: 249 | Disposition: A | Discharge status: Home / Self Care | Payer: BLUE CROSS/BLUE SHIELD | Attending: Cardiovascular Disease | Admitting: Cardiovascular Disease

## 2017-03-29 ENCOUNTER — Encounter (HOSPITAL_COMMUNITY): Payer: Self-pay | Admitting: Emergency Medicine

## 2017-03-29 ENCOUNTER — Encounter (HOSPITAL_COMMUNITY): Payer: Self-pay

## 2017-03-29 ENCOUNTER — Other Ambulatory Visit (HOSPITAL_COMMUNITY): Payer: Self-pay

## 2017-03-29 ENCOUNTER — Encounter (HOSPITAL_COMMUNITY)
Admission: EM | Disposition: A | Discharge status: Home / Self Care | Payer: Self-pay | Source: Home / Self Care | Attending: Cardiovascular Disease

## 2017-03-29 ENCOUNTER — Encounter (INDEPENDENT_AMBULATORY_CARE_PROVIDER_SITE_OTHER)
Admission: RE | Admit: 2017-03-29 | Discharge: 2017-03-29 | Disposition: A | Discharge status: Home / Self Care | Payer: BLUE CROSS/BLUE SHIELD | Source: Ambulatory Visit | Attending: Cardiovascular Disease | Admitting: Cardiovascular Disease

## 2017-03-29 ENCOUNTER — Other Ambulatory Visit: Payer: Self-pay | Admitting: Adult Health

## 2017-03-29 ENCOUNTER — Emergency Department (HOSPITAL_COMMUNITY): Payer: BLUE CROSS/BLUE SHIELD

## 2017-03-29 DIAGNOSIS — Z9861 Coronary angioplasty status: Secondary | ICD-10-CM

## 2017-03-29 DIAGNOSIS — R9439 Abnormal result of other cardiovascular function study: Secondary | ICD-10-CM | POA: Insufficient documentation

## 2017-03-29 DIAGNOSIS — E785 Hyperlipidemia, unspecified: Secondary | ICD-10-CM | POA: Diagnosis present

## 2017-03-29 DIAGNOSIS — Z87891 Personal history of nicotine dependence: Secondary | ICD-10-CM

## 2017-03-29 DIAGNOSIS — R002 Palpitations: Secondary | ICD-10-CM

## 2017-03-29 DIAGNOSIS — I259 Chronic ischemic heart disease, unspecified: Secondary | ICD-10-CM | POA: Diagnosis present

## 2017-03-29 DIAGNOSIS — Z79899 Other long term (current) drug therapy: Secondary | ICD-10-CM | POA: Diagnosis not present

## 2017-03-29 DIAGNOSIS — Z955 Presence of coronary angioplasty implant and graft: Secondary | ICD-10-CM

## 2017-03-29 DIAGNOSIS — E782 Mixed hyperlipidemia: Secondary | ICD-10-CM

## 2017-03-29 DIAGNOSIS — I200 Unstable angina: Secondary | ICD-10-CM | POA: Diagnosis not present

## 2017-03-29 DIAGNOSIS — R079 Chest pain, unspecified: Secondary | ICD-10-CM | POA: Insufficient documentation

## 2017-03-29 DIAGNOSIS — Z90710 Acquired absence of both cervix and uterus: Secondary | ICD-10-CM

## 2017-03-29 DIAGNOSIS — I10 Essential (primary) hypertension: Secondary | ICD-10-CM

## 2017-03-29 DIAGNOSIS — Z7982 Long term (current) use of aspirin: Secondary | ICD-10-CM

## 2017-03-29 DIAGNOSIS — Z8249 Family history of ischemic heart disease and other diseases of the circulatory system: Secondary | ICD-10-CM | POA: Diagnosis not present

## 2017-03-29 DIAGNOSIS — I25110 Atherosclerotic heart disease of native coronary artery with unstable angina pectoris: Secondary | ICD-10-CM | POA: Diagnosis not present

## 2017-03-29 DIAGNOSIS — I2510 Atherosclerotic heart disease of native coronary artery without angina pectoris: Secondary | ICD-10-CM

## 2017-03-29 HISTORY — DX: Essential (primary) hypertension: I10

## 2017-03-29 HISTORY — PX: LEFT HEART CATH AND CORONARY ANGIOGRAPHY: CATH118249

## 2017-03-29 HISTORY — DX: Pure hypercholesterolemia, unspecified: E78.00

## 2017-03-29 HISTORY — DX: Atherosclerotic heart disease of native coronary artery without angina pectoris: I25.10

## 2017-03-29 HISTORY — PX: CORONARY STENT INTERVENTION: CATH118234

## 2017-03-29 HISTORY — DX: Headache, unspecified: R51.9

## 2017-03-29 HISTORY — DX: Headache: R51

## 2017-03-29 HISTORY — PX: CORONARY ANGIOPLASTY WITH STENT PLACEMENT: SHX49

## 2017-03-29 LAB — CBC WITH DIFFERENTIAL/PLATELET
Basophils Absolute: 0 10*3/uL (ref 0.0–0.1)
Basophils Relative: 0 %
Eosinophils Absolute: 0.1 10*3/uL (ref 0.0–0.7)
Eosinophils Relative: 1 %
HCT: 35.8 % — ABNORMAL LOW (ref 36.0–46.0)
Hemoglobin: 12.2 g/dL (ref 12.0–15.0)
Lymphocytes Relative: 27 %
Lymphs Abs: 1.6 10*3/uL (ref 0.7–4.0)
MCH: 30.3 pg (ref 26.0–34.0)
MCHC: 34.1 g/dL (ref 30.0–36.0)
MCV: 88.8 fL (ref 78.0–100.0)
Monocytes Absolute: 0.3 10*3/uL (ref 0.1–1.0)
Monocytes Relative: 5 %
Neutro Abs: 4 10*3/uL (ref 1.7–7.7)
Neutrophils Relative %: 67 %
Platelets: 228 10*3/uL (ref 150–400)
RBC: 4.03 MIL/uL (ref 3.87–5.11)
RDW: 13.2 % (ref 11.5–15.5)
WBC: 6 10*3/uL (ref 4.0–10.5)

## 2017-03-29 LAB — TROPONIN I: Troponin I: <0.03 ng/mL (ref <0.03)

## 2017-03-29 LAB — NM MYOCAR MULTI W/SPECT W/WALL MOTION / EF
Estimated workload: 10.1 METS
Exercise duration (min): 7 min
Exercise duration (sec): 27 s
MPHR: 159 {beats}/min
Peak HR: 131 {beats}/min
Percent HR: 82 %
RPE: 15
Rest HR: 53 {beats}/min

## 2017-03-29 LAB — COMPREHENSIVE METABOLIC PANEL
ALT: 21 U/L (ref 14–54)
AST: 24 U/L (ref 15–41)
Albumin: 4.1 g/dL (ref 3.5–5.0)
Alkaline Phosphatase: 53 U/L (ref 38–126)
Anion gap: 9 (ref 5–15)
BUN: 19 mg/dL (ref 6–20)
CO2: 26 mmol/L (ref 22–32)
Calcium: 9.3 mg/dL (ref 8.9–10.3)
Chloride: 102 mmol/L (ref 101–111)
Creatinine, Ser: 0.85 mg/dL (ref 0.44–1.00)
GFR calc Af Amer: >60 mL/min (ref >60)
GFR calc non Af Amer: >60 mL/min (ref >60)
Glucose, Bld: 107 mg/dL — ABNORMAL HIGH (ref 65–99)
Potassium: 3.4 mmol/L — ABNORMAL LOW (ref 3.5–5.1)
Sodium: 137 mmol/L (ref 135–145)
Total Bilirubin: 0.5 mg/dL (ref 0.3–1.2)
Total Protein: 7.5 g/dL (ref 6.5–8.1)

## 2017-03-29 LAB — PROTIME-INR
INR: 0.9
Prothrombin Time: 12.2 seconds (ref 11.4–15.2)

## 2017-03-29 IMAGING — NM NM MYOCAR MULTI W/SPECT W/WALL MOTION & EF
1 series · 6 of 6 positions shown · non-contrast
Comparison: none

[Series 1: rest · 6.51mm/px · 6 of 64 frames shown]
[frame 6/64]
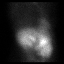
[frame 16/64]
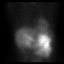
[frame 27/64]
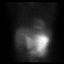
[frame 38/64]
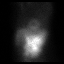
[frame 48/64]
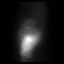
[frame 59/64]
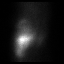

[6 of 6 positions shown; findings below may reference images not displayed]

Canned report from images found in remote index.

Refer to host system for actual result text.

## 2017-03-29 IMAGING — CR DG CHEST 1V PORT
1 series · 1 of 1 positions shown · non-contrast
Comparison: [DATE] report.

CLINICAL DATA: Chest pain.

EXAM:
PORTABLE CHEST 1 VIEW

[portable]
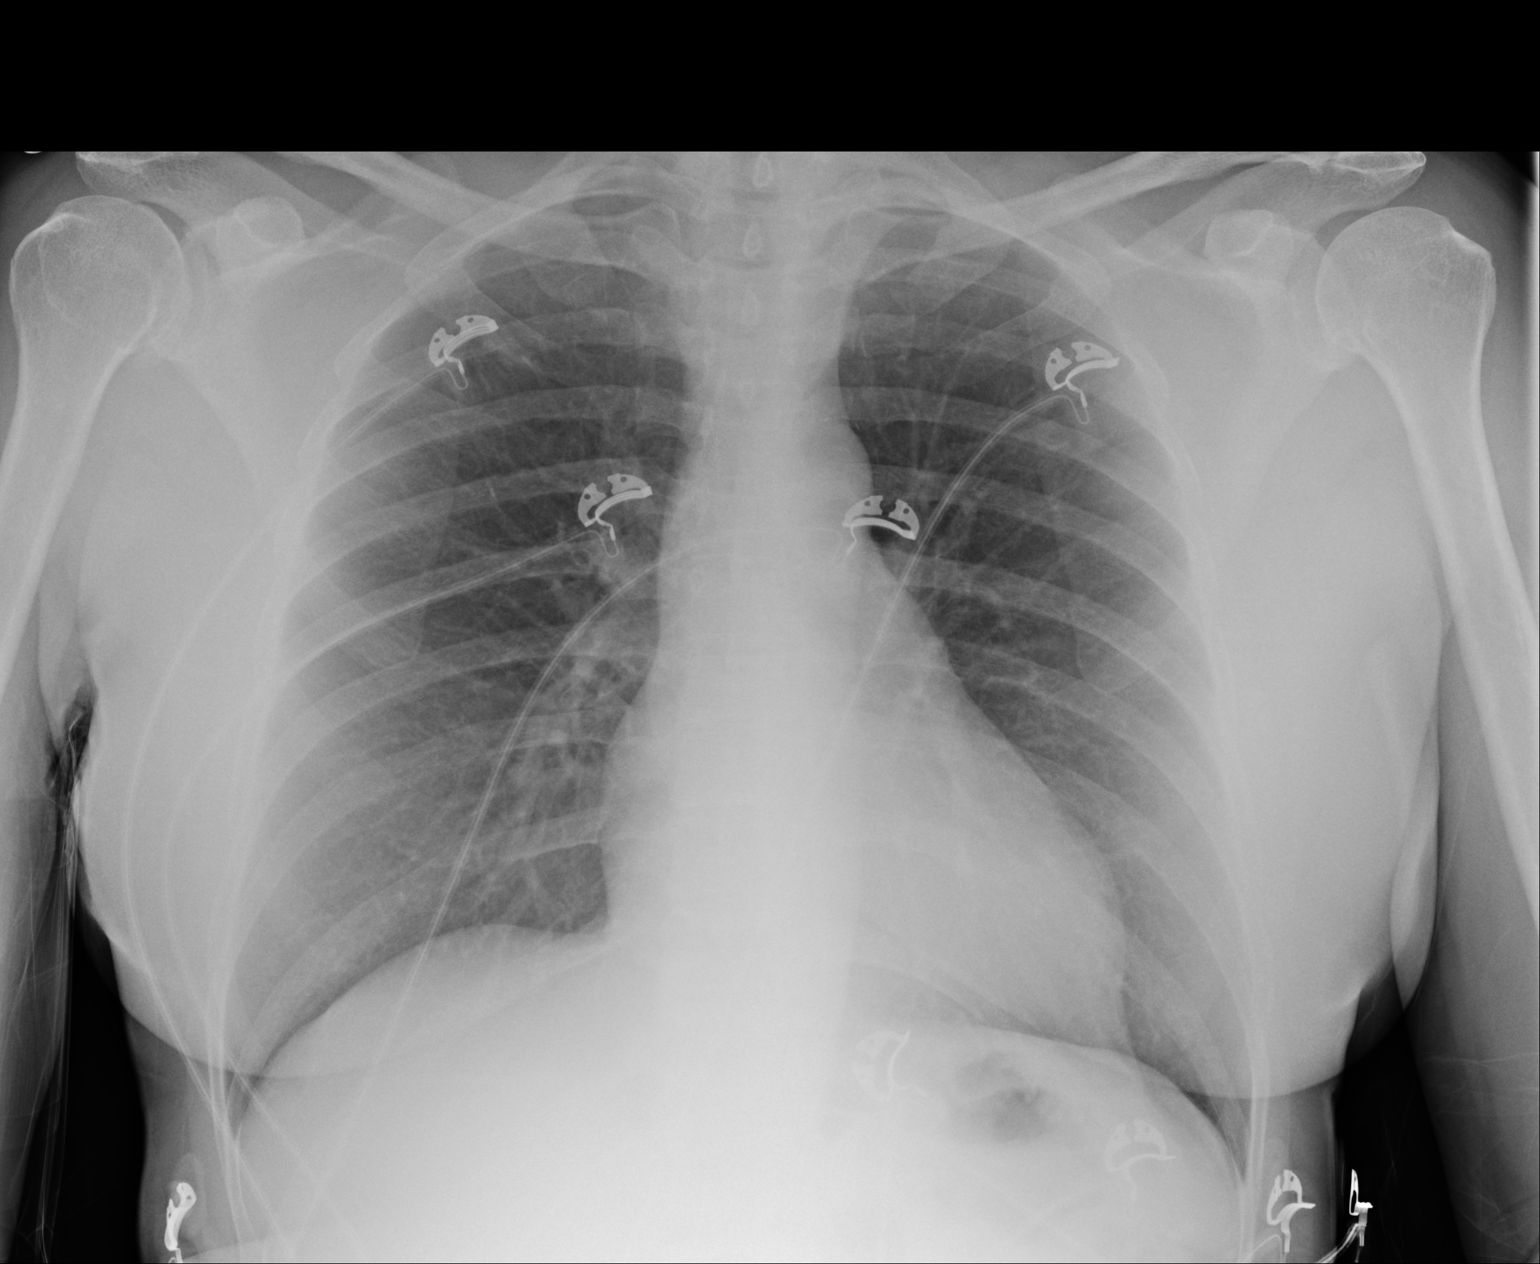

[1 of 1 positions shown; findings below may reference images not displayed]

FINDINGS: Mediastinum and hilar structures normal. Lungs are clear. No pleural
effusion or pneumothorax. Heart size normal. No acute bony
abnormality .
IMPRESSION: No acute abnormality .

## 2017-03-29 SURGERY — LEFT HEART CATH AND CORONARY ANGIOGRAPHY
Anesthesia: LOCAL

## 2017-03-29 MED ORDER — LIDOCAINE HCL 1 % IJ SOLN
INTRAMUSCULAR | Status: AC
  Filled 2017-03-29: qty 20

## 2017-03-29 MED ORDER — REGADENOSON 0.4 MG/5ML IV SOLN
INTRAVENOUS | Status: AC
  Administered 2017-03-29: 0.4 mg via INTRAVENOUS
  Filled 2017-03-29: qty 5

## 2017-03-29 MED ORDER — LIDOCAINE HCL (PF) 1 % IJ SOLN
INTRAMUSCULAR | Status: DC | PRN
  Administered 2017-03-29: 5 mL via INTRADERMAL

## 2017-03-29 MED ORDER — ACTIVE PARTNERSHIP FOR HEALTH OF YOUR HEART BOOK
Freq: Once | Status: AC
  Administered 2017-03-29: 23:00:00
  Filled 2017-03-29: qty 1

## 2017-03-29 MED ORDER — SODIUM CHLORIDE 0.9 % IV SOLN: 250.0000 mL | INTRAVENOUS | Status: DC | PRN

## 2017-03-29 MED ORDER — ASPIRIN 81 MG PO CHEW
81.0000 mg | CHEWABLE_TABLET | Freq: Every day | ORAL | Status: DC
  Administered 2017-03-29 – 2017-03-30 (×2): 81 mg via ORAL
  Filled 2017-03-29 (×2): qty 1

## 2017-03-29 MED ORDER — NITROGLYCERIN IN D5W 200-5 MCG/ML-% IV SOLN
INTRAVENOUS | Status: AC
  Filled 2017-03-29: qty 250

## 2017-03-29 MED ORDER — HEPARIN (PORCINE) IN NACL 2-0.9 UNIT/ML-% IJ SOLN
INTRAMUSCULAR | Status: AC | PRN
  Administered 2017-03-29: 1000 mL via INTRA_ARTERIAL

## 2017-03-29 MED ORDER — MORPHINE SULFATE (PF) 4 MG/ML IV SOLN: 2.0000 mg | INTRAVENOUS | Status: DC | PRN

## 2017-03-29 MED ORDER — TRAMADOL HCL 50 MG PO TABS: 50.0000 mg | ORAL_TABLET | Freq: Two times a day (BID) | ORAL | Status: DC | PRN

## 2017-03-29 MED ORDER — SODIUM CHLORIDE 0.9% FLUSH: 3.0000 mL | Freq: Two times a day (BID) | INTRAVENOUS | Status: DC

## 2017-03-29 MED ORDER — CLOPIDOGREL BISULFATE 75 MG PO TABS
75.0000 mg | ORAL_TABLET | Freq: Every day | ORAL | Status: DC
  Administered 2017-03-30: 75 mg via ORAL
  Filled 2017-03-29: qty 1

## 2017-03-29 MED ORDER — ACETAMINOPHEN 500 MG PO TABS
1000.0000 mg | ORAL_TABLET | Freq: Once | ORAL | Status: AC
  Administered 2017-03-29: 1000 mg via ORAL

## 2017-03-29 MED ORDER — MONTELUKAST SODIUM 10 MG PO TABS
10.0000 mg | ORAL_TABLET | Freq: Every day | ORAL | Status: DC
  Administered 2017-03-29: 10 mg via ORAL
  Filled 2017-03-29: qty 1

## 2017-03-29 MED ORDER — POTASSIUM CHLORIDE CRYS ER 20 MEQ PO TBCR
40.0000 meq | EXTENDED_RELEASE_TABLET | Freq: Once | ORAL | Status: AC
  Administered 2017-03-29: 40 meq via ORAL
  Filled 2017-03-29: qty 2

## 2017-03-29 MED ORDER — NITROGLYCERIN 1 MG/10 ML FOR IR/CATH LAB
INTRA_ARTERIAL | Status: AC
  Filled 2017-03-29: qty 10

## 2017-03-29 MED ORDER — VERAPAMIL HCL 2.5 MG/ML IV SOLN
INTRAVENOUS | Status: AC
  Filled 2017-03-29: qty 2

## 2017-03-29 MED ORDER — SODIUM CHLORIDE 0.9 % IV SOLN: INTRAVENOUS | Status: AC

## 2017-03-29 MED ORDER — ROSUVASTATIN CALCIUM 10 MG PO TABS
10.0000 mg | ORAL_TABLET | Freq: Every day | ORAL | Status: DC
  Administered 2017-03-29 – 2017-03-30 (×2): 10 mg via ORAL
  Filled 2017-03-29 (×2): qty 1

## 2017-03-29 MED ORDER — ATENOLOL 50 MG PO TABS
50.0000 mg | ORAL_TABLET | Freq: Every day | ORAL | Status: DC
  Administered 2017-03-29 – 2017-03-30 (×2): 50 mg via ORAL
  Filled 2017-03-29 (×2): qty 1

## 2017-03-29 MED ORDER — NITROGLYCERIN IN D5W 200-5 MCG/ML-% IV SOLN
INTRAVENOUS | Status: AC | PRN
  Administered 2017-03-29: 10 ug/min via INTRAVENOUS

## 2017-03-29 MED ORDER — IODIXANOL 320 MG/ML IV SOLN
INTRAVENOUS | Status: DC | PRN
  Administered 2017-03-29: 230 mL via INTRA_ARTERIAL

## 2017-03-29 MED ORDER — BIVALIRUDIN BOLUS VIA INFUSION - CUPID
INTRAVENOUS | Status: DC | PRN
  Administered 2017-03-29: 55.425 mg via INTRAVENOUS

## 2017-03-29 MED ORDER — FAMOTIDINE IN NACL 20-0.9 MG/50ML-% IV SOLN
INTRAVENOUS | Status: AC | PRN
  Administered 2017-03-29: 20 mg via INTRAVENOUS

## 2017-03-29 MED ORDER — ATENOLOL-CHLORTHALIDONE 50-25 MG PO TABS: 1.0000 | ORAL_TABLET | Freq: Every day | ORAL | Status: DC

## 2017-03-29 MED ORDER — IOPAMIDOL (ISOVUE-370) INJECTION 76%
INTRAVENOUS | Status: AC
  Filled 2017-03-29: qty 100

## 2017-03-29 MED ORDER — TECHNETIUM TC 99M TETROFOSMIN IV KIT
30.0000 | PACK | Freq: Once | INTRAVENOUS | Status: AC | PRN
  Administered 2017-03-29: 30 via INTRAVENOUS

## 2017-03-29 MED ORDER — SODIUM CHLORIDE 0.9 % IV SOLN
INTRAVENOUS | Status: AC | PRN
  Administered 2017-03-29: 74 mL/h via INTRAVENOUS

## 2017-03-29 MED ORDER — VERAPAMIL HCL 2.5 MG/ML IV SOLN
INTRA_ARTERIAL | Status: DC | PRN
  Administered 2017-03-29 (×2): 5 mL via INTRA_ARTERIAL

## 2017-03-29 MED ORDER — AMLODIPINE BESYLATE 10 MG PO TABS
10.0000 mg | ORAL_TABLET | Freq: Every day | ORAL | Status: DC
  Administered 2017-03-29 – 2017-03-30 (×2): 10 mg via ORAL
  Filled 2017-03-29 (×2): qty 1

## 2017-03-29 MED ORDER — HYDRALAZINE HCL 20 MG/ML IJ SOLN: 5.0000 mg | INTRAMUSCULAR | Status: AC | PRN

## 2017-03-29 MED ORDER — POTASSIUM CHLORIDE 20 MEQ PO PACK
20.0000 meq | PACK | Freq: Every day | ORAL | Status: DC
  Administered 2017-03-29 – 2017-03-30 (×2): 20 meq via ORAL
  Filled 2017-03-29 (×2): qty 1

## 2017-03-29 MED ORDER — ONDANSETRON HCL 4 MG/2ML IJ SOLN: 4.0000 mg | Freq: Four times a day (QID) | INTRAMUSCULAR | Status: DC | PRN

## 2017-03-29 MED ORDER — BIVALIRUDIN TRIFLUOROACETATE 250 MG IV SOLR
INTRAVENOUS | Status: AC
  Filled 2017-03-29: qty 250

## 2017-03-29 MED ORDER — LABETALOL HCL 5 MG/ML IV SOLN: 10.0000 mg | INTRAVENOUS | Status: AC | PRN

## 2017-03-29 MED ORDER — CLOPIDOGREL BISULFATE 300 MG PO TABS
ORAL_TABLET | ORAL | Status: DC | PRN
  Administered 2017-03-29: 600 mg via ORAL

## 2017-03-29 MED ORDER — SODIUM CHLORIDE 0.9% FLUSH: 3.0000 mL | INTRAVENOUS | Status: DC | PRN

## 2017-03-29 MED ORDER — HEPARIN SODIUM (PORCINE) 1000 UNIT/ML IJ SOLN
INTRAMUSCULAR | Status: AC
  Filled 2017-03-29: qty 1

## 2017-03-29 MED ORDER — SODIUM CHLORIDE 0.9 % IV SOLN
INTRAVENOUS | Status: AC | PRN
  Administered 2017-03-29: 17:00:00
  Administered 2017-03-29: 1.75 mg/kg/h via INTRAVENOUS

## 2017-03-29 MED ORDER — ACETAMINOPHEN 325 MG PO TABS
650.0000 mg | ORAL_TABLET | ORAL | Status: DC | PRN
  Administered 2017-03-29: 21:00:00 650 mg via ORAL
  Filled 2017-03-29: qty 2

## 2017-03-29 MED ORDER — CHLORTHALIDONE 25 MG PO TABS
25.0000 mg | ORAL_TABLET | Freq: Every day | ORAL | Status: DC
  Administered 2017-03-29 – 2017-03-30 (×2): 25 mg via ORAL
  Filled 2017-03-29 (×2): qty 1

## 2017-03-29 MED ORDER — FAMOTIDINE IN NACL 20-0.9 MG/50ML-% IV SOLN
INTRAVENOUS | Status: AC
  Filled 2017-03-29: qty 50

## 2017-03-29 MED ORDER — ACETAMINOPHEN 500 MG PO TABS
ORAL_TABLET | ORAL | Status: AC
  Filled 2017-03-29: qty 2

## 2017-03-29 MED ORDER — SODIUM CHLORIDE 0.9% FLUSH
INTRAVENOUS | Status: AC
  Administered 2017-03-29: 10 mL via INTRAVENOUS
  Filled 2017-03-29: qty 10

## 2017-03-29 MED ORDER — NITROGLYCERIN IN D5W 200-5 MCG/ML-% IV SOLN: 3.0000 ug/min | INTRAVENOUS | Status: DC

## 2017-03-29 MED ORDER — HEPARIN (PORCINE) IN NACL 2-0.9 UNIT/ML-% IJ SOLN
INTRAMUSCULAR | Status: AC
  Filled 2017-03-29: qty 1000

## 2017-03-29 MED ORDER — SODIUM CHLORIDE 0.9 % IV SOLN
1.7500 mg/kg/h | INTRAVENOUS | Status: AC
  Administered 2017-03-29: 1.75 mg/kg/h via INTRAVENOUS
  Filled 2017-03-29: qty 250

## 2017-03-29 MED ORDER — TECHNETIUM TC 99M TETROFOSMIN IV KIT
10.0000 | PACK | Freq: Once | INTRAVENOUS | Status: AC | PRN
  Administered 2017-03-29: 10.6 via INTRAVENOUS

## 2017-03-29 MED ORDER — ANGIOPLASTY BOOK
Freq: Once | Status: AC
  Administered 2017-03-29: 21:00:00
  Filled 2017-03-29: qty 1

## 2017-03-29 MED ORDER — HEPARIN SODIUM (PORCINE) 1000 UNIT/ML IJ SOLN
INTRAMUSCULAR | Status: DC | PRN
  Administered 2017-03-29: 3500 [IU] via INTRAVENOUS

## 2017-03-29 SURGICAL SUPPLY — 19 items
BALLN SAPPHIRE 2.0X12 (BALLOONS) ×2
BALLOON SAPPHIRE 2.0X12 (BALLOONS) ×1 IMPLANT
CATH INFINITI 5FR ANG PIGTAIL (CATHETERS) ×2 IMPLANT
CATH LAUNCHER 6FR AL.75 (CATHETERS) ×2 IMPLANT
CATH LAUNCHER 6FR JR4 (CATHETERS) ×2 IMPLANT
CATH OPTITORQUE TIG 4.0 5F (CATHETERS) ×2 IMPLANT
DEVICE RAD COMP TR BAND LRG (VASCULAR PRODUCTS) ×2 IMPLANT
GLIDESHEATH SLEND A-KIT 6F 22G (SHEATH) ×2 IMPLANT
GUIDEWIRE INQWIRE 1.5J.035X260 (WIRE) ×1 IMPLANT
INQWIRE 1.5J .035X260CM (WIRE) ×2
KIT ENCORE 26 ADVANTAGE (KITS) ×2 IMPLANT
KIT HEART LEFT (KITS) ×2 IMPLANT
PACK CARDIAC CATHETERIZATION (CUSTOM PROCEDURE TRAY) ×2 IMPLANT
STENT SYNERGY DES 2.25X16 (Permanent Stent) ×2 IMPLANT
SYR MEDRAD MARK V 150ML (SYRINGE) ×2 IMPLANT
TRANSDUCER W/STOPCOCK (MISCELLANEOUS) ×2 IMPLANT
TUBING CIL FLEX 10 FLL-RA (TUBING) ×2 IMPLANT
WIRE ASAHI PROWATER 180CM (WIRE) ×2 IMPLANT
WIRE HI TORQ VERSACORE-J 145CM (WIRE) ×2 IMPLANT

## 2017-03-29 NOTE — ED Notes (Signed)
Pt signed consent for cardiac cath

## 2017-03-29 NOTE — Interval H&P Note (Signed)
Cath Lab Visit (complete for each Cath Lab visit)  Clinical Evaluation Leading to the Procedure:   ACS: Yes.    Non-ACS:    Anginal Classification: CCS III  Anti-ischemic medical therapy: Maximal Therapy (2 or more classes of medications)  Non-Invasive Test Results: No non-invasive testing performed  Prior CABG: No previous CABG      History and Physical Interval Note:  03/29/2017 3:39 PM  Diane Parker  has presented today for surgery, with the diagnosis of cp  The various methods of treatment have been discussed with the patient and family. After consideration of risks, benefits and other options for treatment, the patient has consented to  Procedure(s): Left Heart Cath and Coronary Angiography (N/A) as a surgical intervention .  The patient's history has been reviewed, patient examined, no change in status, stable for surgery.  I have reviewed the patient's chart and labs.  Questions were answered to the patient's satisfaction.     Quay Burow

## 2017-03-29 NOTE — ED Triage Notes (Signed)
Pt brought over from cardiac rehab.  Pt had EKG changes while on treadmill.  Also got Lexiscan due to not achieving her target heart rate and had 2 doses of myoview.

## 2017-03-29 NOTE — ED Provider Notes (Addendum)
[facility] DEPT Provider Note   CSN: [id number] Arrival date & time: 03/29/17  8270     History   Chief Complaint Chief Complaint  Patient presents with  . Chest Pain    HPI Diane Parker is a 61 y.o. female.  Patient was transferred from the cardiology office a brief time ago. She was undergoing a treadmill stress test when she became increasingly dyspneic and hypertensive.  Her EKG showed ST depression inferiorly and laterally. The cardiologist discontinued the test and discussed the case with his colleague in [employer]. Patient will be transferred to [facility] when a bed opens up. She is feeling much better in the ED. Decreased chest pain, dyspnea, diaphoresis.      Past Medical History:  Diagnosis Date  . Acute diverticulitis 03/28/2013   Seen on CT - treated with flagyl an cipro  . Arthritis   . Essential hypertension   . GERD (gastroesophageal reflux disease)     Patient Active Problem List   Diagnosis Date Noted  . GERD (gastroesophageal reflux disease) 06/12/2013  . Adenomatous polyp 06/12/2013  . Acute diverticulitis 03/28/2013  . LLQ pain 03/26/2013  . History of diverticulosis 03/26/2013    Past Surgical History:  Procedure Laterality Date  . ABDOMINAL HYSTERECTOMY    . COLONOSCOPY  11/08/2007   BEM:[id number], rectosigmoid junction/hyperplastic-appearing polypoid    mucosa, 1 of these areas biopsied.  Otherwise, normal rectum/Left-sided transverse diverticula/Diminutive polyp just distal to the ileocecal valve. Hyperplastic and adenomatous polyp.   . COLONOSCOPY N/A 06/26/2013   QRF:[id number] diverticulosis. Colonic polyps removed  . ESOPHAGOGASTRODUODENOSCOPY  2008   RMR: normal  . TUBAL LIGATION      OB History    Gravida Para Term Preterm AB Living   3 2     1      SAB TAB Ectopic Multiple Live Births   1               Home Medications    Prior to Admission medications   Medication Sig Start Date End Date Taking? Authorizing  Provider  amLODipine (NORVASC) 10 MG tablet Take 10 mg by mouth daily.   Yes [provider]  atenolol-chlorthalidone (TENORETIC) 50-25 MG per tablet Take 1 tablet by mouth daily.   Yes [provider]  diclofenac sodium (VOLTAREN) 1 % GEL Apply topically 4 (four) times daily.   Yes [provider]  estradiol (ESTRACE) 2 MG tablet TAKE ONE TABLET BY MOUTH EVERY DAY 07/10/14  Yes Jonnie Kind, MD  montelukast (SINGULAIR) 10 MG tablet Take 10 mg by mouth at bedtime.   Yes [provider]  Multiple Vitamin (MULTIVITAMIN WITH MINERALS) TABS tablet Take 1 tablet by mouth daily.   Yes [provider]  Multiple Vitamins-Minerals (HAIR/SKIN/NAILS PO) Take 1 tablet by mouth daily.   Yes [provider]  polyethylene glycol-electrolytes (TRILYTE) 420 G solution Take 4,000 mLs by mouth as directed. 06/12/13  Yes Rourk, Cristopher Estimable, MD  potassium chloride (KLOR-CON) 20 MEQ packet Take 20 mEq by mouth daily.   Yes [provider]  rosuvastatin (CRESTOR) 10 MG tablet Take 10 mg by mouth daily.   Yes [provider]  sertraline (ZOLOFT) 25 MG tablet Take 25 mg by mouth daily as needed (Hot Flashes). 11/27/12  Yes Estill Dooms, NP  traMADol (ULTRAM) 50 MG tablet Take 50 mg by mouth as needed for pain.   Yes [provider]  UNABLE TO FIND ASA 500MG  AND CAFFEINE  32.5MG  AS NEEDED  Yes [provider]    Family History Family History  Problem Relation Age of Onset  . Aneurysm Mother   . Heart disease Father   . Cancer Cousin        breast  . Colon cancer Neg Hx     Social History Social History  Substance Use Topics  . Smoking status: Former Smoker    Types: Cigarettes  . Smokeless tobacco: Never Used  . Alcohol use Yes     Comment: beer occasionally     Allergies   Patient has no known allergies.   Review of Systems Review of Systems  All other systems reviewed and are negative.    Physical  Exam Updated Vital Signs BP (!) 180/90 (BP Location: Right Arm)   Pulse 66   Temp 97.8 F (36.6 C) (Oral)   Resp 18   Ht 5\' 3"  (1.6 m)   Wt 73.9 kg (163 lb)   SpO2 100%   BMI 28.87 kg/m   Physical Exam  Constitutional: She is oriented to person, place, and time. She appears well-developed and well-nourished.  HENT:  Head: Normocephalic and atraumatic.  Eyes: Conjunctivae are normal.  Neck: Neck supple.  Cardiovascular: Normal rate and regular rhythm.   Pulmonary/Chest: Effort normal and breath sounds normal.  Abdominal: Soft. Bowel sounds are normal.  Musculoskeletal: Normal range of motion.  Neurological: She is alert and oriented to person, place, and time.  Skin: Skin is warm and dry.  Psychiatric: She has a normal mood and affect. Her behavior is normal.  Nursing note and vitals reviewed.    ED Treatments / Results  Labs (all labs ordered are listed, but only abnormal results are displayed) Labs Reviewed  CBC WITH DIFFERENTIAL/PLATELET - Abnormal; Notable for the following:       Result Value   HCT 35.8 (*)    All other components within normal limits  COMPREHENSIVE METABOLIC PANEL - Abnormal; Notable for the following:    Potassium 3.4 (*)    Glucose, Bld 107 (*)    All other components within normal limits  TROPONIN I    EKG  EKG Interpretation None       Date: 03/29/2017  Rate: 68  Rhythm: normal sinus rhythm  QRS Axis: normal  Intervals: normal  ST/T Wave abnormalities: normal  Conduction Disutrbances: none  Narrative Interpretation: unremarkable    Radiology Nm Myocar Multi W/spect W/wall Motion / Ef  Result Date: 03/29/2017  Downsloping ST segment depression ST segment depression of 1.5 mm was noted during stress in the II, III, aVF, V4, V5 and V6 leads.  Abnormal ST segment depression noted in leads II, III, aVF, and V4 through V6 near peak achieved exercise at inadequate heart rate response and also following Lexiscan. Patient reported  symptoms concerning for angina as well. There is no associated imaging for this study as stress pictures were not obtained. Patient was taken to the ER for further evaluation.    Dg Chest Port 1 View  Result Date: 03/29/2017 CLINICAL DATA:  Chest pain. EXAM: PORTABLE CHEST 1 VIEW COMPARISON:  07/08/2011 report. FINDINGS: Mediastinum and hilar structures normal. Lungs are clear. No pleural effusion or pneumothorax. Heart size normal. No acute bony abnormality . IMPRESSION: No acute abnormality . Electronically Signed   By: Marcello Moores  Register   On: 03/29/2017 10:50    Procedures Procedures (including critical care time)  Medications Ordered in ED Medications  acetaminophen (TYLENOL) tablet 1,000 mg (1,000 mg Oral Given 03/29/17 1205)  Initial Impression / Assessment and Plan / ED Course  I have reviewed the triage vital signs and the nursing notes.  Pertinent labs & imaging results that were available during my care of the patient were reviewed by me and considered in my medical decision making (see chart for details).   Patient is hemodynamically stable. She is awaiting a bed at [facility] for transfer to the cardiology service  Final Clinical Impressions(s) / ED Diagnoses   Final diagnoses:  Chest pain, unspecified type    New Prescriptions New Prescriptions   No medications on file     Nat Christen, MD 03/29/17 [place]    Nat Christen, MD 03/29/17 1359

## 2017-03-29 NOTE — Progress Notes (Unsigned)
CARDIOLOGY HISTORY AND PHYSICAL   Patient ID: Diane Parker MRN: [id number]  DOB/AGE: 11-Jun-1956 61 y.o. Admit date: (Not on file)  Primary Care Physician: Redmond School, MD Primary Cardiologist: Bronson Ing Clinical Summary  Ms. Diane Parker is a 61 y.o.female with known history of hypertension, and hyperlipidemia, who was seen by Dr. Bronson Ing on 03/19/2017 for complaints of chest pain and palpitations. She was scheduled for stress test for diagnostic prognostic purposes.   During stress test, she had ST depression in the infero/lateral leads, with hypertension, and chest discomfort. Unable to complete the test due to fatigue. Was given Lexiscan. After infusion significant T wave inversion, ST elevation in AvR, with associated nausea, dizziness, and hypotension BP decreased to 86/57 mmHg. She continued to have dyspnea, diaphoresis and began to have chest pressure and pain.   EKG was reviewed by Dr. Domenic Polite. Patient was sent to ER for transfer to [facility] for cardiac cath. Labs are being drawn. IV fluids are started at 50 cc hour. BP has since normalized along with EKG, with improvement in T-wave inversion and ST depression.    No Known Allergies  Home Medications Medication Sig Dispense Refill  . amLODipine (NORVASC) 10 MG tablet Take 10 mg by mouth daily.    Marland Kitchen aspirin 81 MG tablet Take 81 mg by mouth daily.    Marland Kitchen atenolol-chlorthalidone (TENORETIC) 50-25 MG per tablet Take 1 tablet by mouth daily.    . diclofenac sodium (VOLTAREN) 1 % GEL Apply topically 4 (four) times daily.    Marland Kitchen estradiol (ESTRACE) 2 MG tablet TAKE ONE TABLET BY MOUTH EVERY DAY 30 tablet 6  . montelukast (SINGULAIR) 10 MG tablet Take 10 mg by mouth at bedtime.    . Multiple Vitamin (MULTIVITAMIN WITH MINERALS) TABS tablet Take 1 tablet by mouth daily.    . Multiple Vitamins-Minerals (HAIR/SKIN/NAILS PO) Take 1 tablet by mouth daily.    . polyethylene glycol-electrolytes (TRILYTE) 420 G solution Take  4,000 mLs by mouth as directed. 4000 mL 0  . potassium chloride (KLOR-CON) 20 MEQ packet Take 20 mEq by mouth daily.    . rosuvastatin (CRESTOR) 10 MG tablet Take 10 mg by mouth daily.    . sertraline (ZOLOFT) 25 MG tablet Take 25 mg by mouth daily as needed (Hot Flashes).    . traMADol (ULTRAM) 50 MG tablet Take 50 mg by mouth as needed for pain.    Marland Kitchen UNABLE TO FIND ASA 500MG  AND CAFFEINE  32.5MG  AS NEEDED      Scheduled Medications    Infusions    PRN Medications    Past Medical History:  Diagnosis Date  . Acute diverticulitis 03/28/2013   Seen on CT taking flagyl an cipro and feels better  . Arthritis   . GERD (gastroesophageal reflux disease)   . History of diverticulosis 03/26/2013  . Hypertension   . LLQ pain 03/26/2013   With nausea and diarrhea and some constipation    Past Surgical History:  Procedure Laterality Date  . ABDOMINAL HYSTERECTOMY    . COLONOSCOPY  11/08/2007   LOV:[id number], rectosigmoid junction/hyperplastic-appearing polypoid    mucosa, 1 of these areas biopsied.  Otherwise, normal rectum/Left-sided transverse diverticula/Diminutive polyp just distal to the ileocecal valve. Hyperplastic and adenomatous polyp.   . COLONOSCOPY N/A 06/26/2013   ZYS:[id number] diverticulosis. Colonic polyps removed  . ESOPHAGOGASTRODUODENOSCOPY  2008   RMR: normal  . TUBAL LIGATION      Family History  Problem Relation Age of Onset  . Aneurysm Mother   .  Heart disease Father   . Cancer Cousin        breast  . Colon cancer Neg Hx     Social History Diane Parker reports that she has quit smoking. Her smoking use included Cigarettes. She has never used smokeless tobacco. Diane Parker reports that she drinks alcohol.  Review of Systems Otherwise reviewed and negative except as outlined.  Physical Examination Gen: Complaining of chest pressure, nausea and dyspnea. Marland Kitchen HEENT: Conjunctiva and lids normal, oropharynx clear with moist mucosa. Neck: Supple,  no elevated JVP or carotid bruits, no thyromegaly. Lungs: Clear to auscultation, nonlabored breathing at rest. Cardiac: Regular rate and rhythm, tachycardic, no S3 or significant systolic murmur, no pericardial rub. Abdomen: Soft, nontender, no hepatomegaly, bowel sounds present, no guarding or rebound. Extremities: No pitting edema, distal pulses 2+. Skin: Warm and dry. Musculoskeletal: No kyphosis. Neuropsychiatric: Alert and oriented x3, affect grossly appropriate.   Lab Results (STAT labs are pending in ER)  Basic Metabolic Panel: No results for input(s): NA, K, CL, CO2, GLUCOSE, BUN, CREATININE, CALCIUM, MG, PHOS in the last 168 hours.  Liver Function Tests: No results for input(s): AST, ALT, ALKPHOS, BILITOT, PROT, ALBUMIN in the last 168 hours.  CBC: No results for input(s): WBC, NEUTROABS, HGB, HCT, MCV, PLT in the last 168 hours.  Cardiac Enzymes: No results for input(s): CKTOTAL, CKMB, CKMBINDEX, TROPONINI in the last 168 hours.  BNP: Invalid input(s): POCBNP   Radiology No results found.  Prior Cardiac Testing/Procedures:   ECG Please see stress test EKG;s. Currently NSR with T-wave inversion inferior/lateral leads.    Impression and Recommendations 1. Abnormal stress test EKG: Significant ST depression, T-wave inversion, with ST elevation in AvR with associated chest pain, diaphoresis, nausea,and dyspnea. Unable to complete walking portion of the test due to fatigue and dyspnea with some dizziness. She was given Lexiscan and began to have T-wave changes and worsening ST depression with chest pressure. She was taken to ER, IV fluids are started. She is to be transferred to [facility] for cardiac cath. The patient's EKG has been reviewed by Dr. Domenic Polite.  I have explained the need to transfer to [facility] for concerns of CAD and need for cardiac cath. She is anxious but is agrreeable to be transferred. Labs are being ordered STAT.   2. Hypertension: BP was initially elevated  during stress test. She did drop BP, as expected with Lexiscan infusion, but became symptomatic with worsening chest pain and diaphoresis. BP has normalized since being sent to ER.   3. Hyperlipidemia; Currently on Crestor 10 mg daily.    Signed: Phill Myron. West Pugh, ANP, AACC  03/29/2017, 9:58 AM Co-Sign MD

## 2017-03-29 NOTE — H&P (Signed)
CARDIOLOGY HISTORY AND PHYSICAL   Patient ID: Diane Parker MRN: [id number]  DOB/AGE: 62/28/1957 62 y.o. Admit date: 03/29/2017  Primary Care Physician: Redmond School, MD Primary Cardiologist: Dr. Kate Sable  Clinical Summary Diane Parker is a 61 y.o.female with history of hypertension, hyperlipidemia, and recent palpitations. She was seen by Dr. Bronson Ing on 03/19/2017 for additional complaint of exertional chest pain. The patient was scheduled for a stress Myoview today.   During stress test she began to have significant dyspnea and hypertension, was unable to reach maximum HR. She had ST segment depression inferior/lateral. She became weak. She was changed to [employer]. After infusion she began to have worsening chest pain, nausea, hypotension and diaphoresis. EKG revealed significant T-wave inversion, with further ST segment abnormalities. Test was discontinued and she was taken to the ER for further assessment. Of note, only resting images were able to be obtained.  No Known Allergies  Home Medications Current Outpatient Prescriptions  Medication Sig Dispense Refill  . amLODipine (NORVASC) 10 MG tablet Take 10 mg by mouth daily.    Marland Kitchen aspirin 81 MG tablet Take 81 mg by mouth daily.    Marland Kitchen atenolol-chlorthalidone (TENORETIC) 50-25 MG per tablet Take 1 tablet by mouth daily.    . diclofenac sodium (VOLTAREN) 1 % GEL Apply topically 4 (four) times daily.    Marland Kitchen estradiol (ESTRACE) 2 MG tablet TAKE ONE TABLET BY MOUTH EVERY DAY 30 tablet 6  . montelukast (SINGULAIR) 10 MG tablet Take 10 mg by mouth at bedtime.    . Multiple Vitamin (MULTIVITAMIN WITH MINERALS) TABS tablet Take 1 tablet by mouth daily.    . Multiple Vitamins-Minerals (HAIR/SKIN/NAILS PO) Take 1 tablet by mouth daily.    . polyethylene glycol-electrolytes (TRILYTE) 420 G solution Take 4,000 mLs by mouth as directed. 4000 mL 0  . potassium chloride (KLOR-CON) 20 MEQ packet Take 20 mEq by mouth daily.     . rosuvastatin (CRESTOR) 10 MG tablet Take 10 mg by mouth daily.    . sertraline (ZOLOFT) 25 MG tablet Take 25 mg by mouth daily as needed (Hot Flashes).    . traMADol (ULTRAM) 50 MG tablet Take 50 mg by mouth as needed for pain.    Marland Kitchen UNABLE TO FIND ASA 500MG  AND CAFFEINE  32.5MG  AS NEEDED      Past Medical History:  Diagnosis Date  . Acute diverticulitis 03/28/2013   Seen on CT - treated with flagyl an cipro  . Arthritis   . Essential hypertension   . GERD (gastroesophageal reflux disease)     Past Surgical History:  Procedure Laterality Date  . ABDOMINAL HYSTERECTOMY    . COLONOSCOPY  11/08/2007   GMW:[id number], rectosigmoid junction/hyperplastic-appearing polypoid    mucosa, 1 of these areas biopsied.  Otherwise, normal rectum/Left-sided transverse diverticula/Diminutive polyp just distal to the ileocecal valve. Hyperplastic and adenomatous polyp.   . COLONOSCOPY N/A 06/26/2013   HKV:[id number] diverticulosis. Colonic polyps removed  . ESOPHAGOGASTRODUODENOSCOPY  2008   RMR: normal  . TUBAL LIGATION      Family History  Problem Relation Age of Onset  . Aneurysm Mother   . Heart disease Father   . Cancer Cousin        breast  . Colon cancer Neg Hx     Social History Diane Parker reports that she has quit smoking. Her smoking use included Cigarettes. She has never used smokeless tobacco. Diane Parker reports that she drinks alcohol.  Review of Systems Otherwise reviewed and negative except  as outlined.  Physical Examination Temp:  [97.8 F (36.6 C)] 97.8 F (36.6 C) (07/26 1000) Pulse Rate:  [68-82] 68 (07/26 1030) Resp:  [16-17] 17 (07/26 1030) BP: (151-166)/(73-76) 151/73 (07/26 1030) SpO2:  [100 %] 100 % (07/26 1030) Weight:  [163 lb (73.9 kg)] 163 lb (73.9 kg) (07/26 0958) No intake or output data in the 24 hours ending 03/29/17 1055  Gen: No distress. HEENT: Conjunctiva and lids normal, oropharynx clear with moist mucosa. Neck: Supple, no  elevated JVP or carotid bruits, no thyromegaly. Lungs: Clear to auscultation, nonlabored breathing at rest. Cardiac: Regular rate and rhythm, no S3 or significant systolic murmur, no pericardial rub. Abdomen: Soft, nontender, bowel sounds present, no guarding or rebound. Extremities: No pitting edema, distal pulses 2+. Skin: Warm and dry. Musculoskeletal: No kyphosis. Neuropsychiatric: Alert and oriented x3, affect grossly appropriate.   Lab Results:   Basic Metabolic Panel: No results for input(s): NA, K, CL, CO2, GLUCOSE, BUN, CREATININE, CALCIUM, MG, PHOS in the last 168 hours.  Liver Function Tests: No results for input(s): AST, ALT, ALKPHOS, BILITOT, PROT, ALBUMIN in the last 168 hours.  CBC: No results for input(s): WBC, NEUTROABS, HGB, HCT, MCV, PLT in the last 168 hours.  Cardiac Enzymes: No results for input(s): CKTOTAL, CKMB, CKMBINDEX, TROPONINI in the last 168 hours.  BNP: Invalid input(s): POCBNP   Radiology Dg Chest Port 1 View  Result Date: 03/29/2017 CLINICAL DATA:  Chest pain. EXAM: PORTABLE CHEST 1 VIEW COMPARISON:  07/08/2011 report. FINDINGS: Mediastinum and hilar structures normal. Lungs are clear. No pleural effusion or pneumothorax. Heart size normal. No acute bony abnormality . IMPRESSION: No acute abnormality . Electronically Signed   By: [place]   On: 03/29/2017 10:50    Prior Cardiac Testing/Procedures:  None  Impression and Recommendations  1. Abnormal stress test. Patient developed significant shortness of breath and ultimately chest pain with abnormal ST segment changes causing discontinuation of exercise/Lexiscan Myoview. She did have hypotension after initiation of Lexiscan due to failure to achieve adequate heart rate with treadmill testing, less specific finding in that case. She was transported to the ER, feeling better with time and blood pressure has stabilized. In light of presentation for assessment of progressive exertional chest  pain and these findings, plan is to have her transferred to [facility] in anticipation of a diagnostic cardiac catheterization. Awaiting STAT labs.   2. Hypertension: BP elevated during stress test, but significant drop with chest pain after infusion of Lexiscan. BP is normalizing in the ER.   3. Hyperlipidemia; On Crestor at home. Admission orders include Lipitor 40 mg    Signed: Phill Myron. West Pugh, ANP, AACC  03/29/2017, 10:55 AM Co-Sign MD   Attending note:  Patient seen and examined in ER. Records reviewed including recent office note per Dr. Bronson Ing. Diane Parker was referred for a Lexiscan Myoview today as an outpatient for the assessment of exertional chest pain, also had a history of palpitations not necessarily associated with these symptoms. During the test today she was noted to develop significant shortness of breath and weakness, was not able to achieve adequate heart rate response and was hypertensive on the treadmill with the beginnings of ST segment abnormalities in leads II, III, aVF as well as laterally. She received Lexiscan with acceleration in these symptoms, development of chest pain and nausea, and also hypotension. Study was discontinued without subsequent stress images able to be obtained and she was sent to the ER for further management.  On assessment in ER she reported improvement in chest pain and shortness of breath, blood pressure had stabilized. Lungs clear without crackles or wheezing, cardiac exam with RRR and no gallop. Lab work is pending at present. Chest x-ray shows no acute findings.  Patient presents with symptoms during stress testing concerning for angina and underlying ischemic heart disease necessitating discontinuation of the study. She has a history of exertional chest pain and was recently seen by Dr. Bronson Ing for further evaluation. Stat lab work is pending at this time. Ultimately, plan is transfer to [facility] for a  diagnostic cardiac catheterization to clearly assess coronary anatomy. She is in agreement to proceed.  Satira Sark, M.D., F.A.C.C.

## 2017-03-30 ENCOUNTER — Encounter (HOSPITAL_COMMUNITY): Payer: Self-pay | Admitting: Cardiovascular Disease

## 2017-03-30 ENCOUNTER — Other Ambulatory Visit: Payer: Self-pay | Admitting: Cardiology

## 2017-03-30 ENCOUNTER — Telehealth: Payer: Self-pay

## 2017-03-30 DIAGNOSIS — I10 Essential (primary) hypertension: Secondary | ICD-10-CM

## 2017-03-30 DIAGNOSIS — I25110 Atherosclerotic heart disease of native coronary artery with unstable angina pectoris: Secondary | ICD-10-CM | POA: Diagnosis present

## 2017-03-30 DIAGNOSIS — I2510 Atherosclerotic heart disease of native coronary artery without angina pectoris: Secondary | ICD-10-CM

## 2017-03-30 DIAGNOSIS — Z7982 Long term (current) use of aspirin: Secondary | ICD-10-CM | POA: Diagnosis not present

## 2017-03-30 DIAGNOSIS — Z87891 Personal history of nicotine dependence: Secondary | ICD-10-CM | POA: Diagnosis not present

## 2017-03-30 DIAGNOSIS — Z8249 Family history of ischemic heart disease and other diseases of the circulatory system: Secondary | ICD-10-CM | POA: Diagnosis not present

## 2017-03-30 DIAGNOSIS — Z90710 Acquired absence of both cervix and uterus: Secondary | ICD-10-CM | POA: Diagnosis not present

## 2017-03-30 DIAGNOSIS — I259 Chronic ischemic heart disease, unspecified: Secondary | ICD-10-CM | POA: Insufficient documentation

## 2017-03-30 DIAGNOSIS — Z79899 Other long term (current) drug therapy: Secondary | ICD-10-CM | POA: Diagnosis not present

## 2017-03-30 DIAGNOSIS — Z9861 Coronary angioplasty status: Secondary | ICD-10-CM | POA: Diagnosis not present

## 2017-03-30 DIAGNOSIS — E785 Hyperlipidemia, unspecified: Secondary | ICD-10-CM | POA: Diagnosis present

## 2017-03-30 DIAGNOSIS — R079 Chest pain, unspecified: Secondary | ICD-10-CM | POA: Diagnosis present

## 2017-03-30 DIAGNOSIS — E782 Mixed hyperlipidemia: Secondary | ICD-10-CM | POA: Diagnosis present

## 2017-03-30 DIAGNOSIS — I200 Unstable angina: Secondary | ICD-10-CM | POA: Diagnosis not present

## 2017-03-30 LAB — CBC
HCT: 36.4 % (ref 36.0–46.0)
Hemoglobin: 12.1 g/dL (ref 12.0–15.0)
MCH: 29 pg (ref 26.0–34.0)
MCHC: 33.2 g/dL (ref 30.0–36.0)
MCV: 87.3 fL (ref 78.0–100.0)
Platelets: 244 10*3/uL (ref 150–400)
RBC: 4.17 MIL/uL (ref 3.87–5.11)
RDW: 13.3 % (ref 11.5–15.5)
WBC: 5.7 10*3/uL (ref 4.0–10.5)

## 2017-03-30 LAB — POCT ACTIVATED CLOTTING TIME: Activated Clotting Time: 439 seconds

## 2017-03-30 LAB — BASIC METABOLIC PANEL
Anion gap: 7 (ref 5–15)
BUN: 13 mg/dL (ref 6–20)
CO2: 24 mmol/L (ref 22–32)
Calcium: 9.2 mg/dL (ref 8.9–10.3)
Chloride: 107 mmol/L (ref 101–111)
Creatinine, Ser: 0.97 mg/dL (ref 0.44–1.00)
GFR calc Af Amer: >60 mL/min (ref >60)
GFR calc non Af Amer: >60 mL/min (ref >60)
Glucose, Bld: 100 mg/dL — ABNORMAL HIGH (ref 65–99)
Potassium: 3.9 mmol/L (ref 3.5–5.1)
Sodium: 138 mmol/L (ref 135–145)

## 2017-03-30 MED ORDER — CLOPIDOGREL BISULFATE 75 MG PO TABS: 75.0000 mg | ORAL_TABLET | Freq: Every day | ORAL | 0 refills | Status: DC

## 2017-03-30 MED ORDER — TEMAZEPAM 7.5 MG PO CAPS
7.5000 mg | ORAL_CAPSULE | Freq: Once | ORAL | Status: AC
  Administered 2017-03-30: 01:00:00 7.5 mg via ORAL
  Filled 2017-03-30: qty 1

## 2017-03-30 MED ORDER — NITROGLYCERIN 0.4 MG SL SUBL: 0.4000 mg | SUBLINGUAL_TABLET | SUBLINGUAL | 3 refills | Status: DC | PRN

## 2017-03-30 MED ORDER — IRBESARTAN 75 MG PO TABS: 75.0000 mg | ORAL_TABLET | Freq: Every day | ORAL | 1 refills | Status: DC

## 2017-03-30 MED ORDER — NITROGLYCERIN 0.4 MG SL SUBL: 0.4000 mg | SUBLINGUAL_TABLET | SUBLINGUAL | Status: DC | PRN

## 2017-03-30 MED ORDER — ROSUVASTATIN CALCIUM 40 MG PO TABS: 40.0000 mg | ORAL_TABLET | Freq: Every day | ORAL | 0 refills | Status: DC

## 2017-03-30 MED ORDER — CLOPIDOGREL BISULFATE 75 MG PO TABS: 75.0000 mg | ORAL_TABLET | Freq: Every day | ORAL | 1 refills | Status: DC

## 2017-03-30 MED ORDER — IRBESARTAN 75 MG PO TABS: 75.0000 mg | ORAL_TABLET | Freq: Every day | ORAL | 0 refills | Status: DC

## 2017-03-30 MED ORDER — SODIUM CHLORIDE 0.9 % IV SOLN: 4.0000 mg | Freq: Four times a day (QID) | INTRAVENOUS | Status: DC | PRN

## 2017-03-30 MED ORDER — METOPROLOL TARTRATE 12.5 MG HALF TABLET: 25.0000 mg | ORAL_TABLET | Freq: Two times a day (BID) | ORAL | Status: DC

## 2017-03-30 MED ORDER — TRAMADOL HCL 50 MG PO TABS
50.0000 mg | ORAL_TABLET | Freq: Four times a day (QID) | ORAL | Status: DC | PRN
  Administered 2017-03-30: 50 mg via ORAL
  Filled 2017-03-30: qty 1

## 2017-03-30 MED ORDER — ACETAMINOPHEN 325 MG PO TABS: 650.0000 mg | ORAL_TABLET | ORAL | Status: AC | PRN

## 2017-03-30 MED ORDER — ROSUVASTATIN CALCIUM 40 MG PO TABS: 40.0000 mg | ORAL_TABLET | Freq: Every day | ORAL | 1 refills | Status: DC

## 2017-03-30 MED ORDER — ASPIRIN 81 MG PO CHEW: 81.0000 mg | CHEWABLE_TABLET | Freq: Every day | ORAL | 0 refills | Status: AC

## 2017-03-30 MED ORDER — ATORVASTATIN CALCIUM 10 MG PO TABS: 40.0000 mg | ORAL_TABLET | Freq: Every day | ORAL | Status: DC

## 2017-03-30 NOTE — Discharge Summary (Signed)
Discharge Summary    Patient ID: Diane Parker,  MRN: [id number], DOB/AGE: 06/04/1956 61 y.o.  Admit date: 03/29/2017 Discharge date: 03/30/2017  Primary Care Provider: [employer] Primary Cardiologist: Bronson Ing  Discharge Diagnoses    Principal Problem:   Unstable angina pectoris [facility]) Active Problems:   CAD S/P percutaneous coronary angioplasty   Essential hypertension   Hyperlipidemia with target low density lipoprotein (LDL) cholesterol less than 70 mg/dL   Allergies No Known Allergies  Diagnostic Studies/Procedures    LHC: 03/29/17  Conclusion     Prox LAD lesion, 40 %stenosed.  Ost 1st Diag lesion, 95 %stenosed.  Mid RCA lesion, 99 %stenosed.  Post intervention, there is a 0% residual stenosis.  A stent was successfully placed.  The left ventricular systolic function is normal.  LV end diastolic pressure is normal.  The left ventricular ejection fraction is 55-65% by visual estimate.   IMPRESSION: Successful PCI and drug-eluting stenting of the subtotal mid dominant RCA stenosis with left-to-right collaterals using a 2.5 mm synergy drug-eluting stent. Patient did have a high grade ostial first diagonal branch stenosis which was a small vessel and can be treated medically. She had normal LV function. I placed her on IV nitroglycerin because of systemic hypertension. Angiomax will be continued a focus for 4 hours. The patient will be discharged home in the morning on DAPT for 1 year uninterrupted.  Quay Burow. MD, [facility] _____________   History of Present Illness     Diane Parker is a 61 y.o.female with history of hypertension, hyperlipidemia, and recent palpitations. She was seen by Dr. Bronson Ing on 03/19/2017 for additional complaint of exertional chest pain. The patient was scheduled for a stress Myoview 03/29/17.   During stress test she began to have significant dyspnea and hypertension, was unable to reach maximum HR. She had ST  segment depression inferior/lateral. She became weak. She was changed to Lex iscan. After infusion she began to have worsening chest pain, nausea, hypotension and diaphoresis. EKG revealed significant T-wave inversion, with further ST segment abnormalities. Test was discontinued and she was taken to the ER for further assessment. Of note, only resting images were able to be obtained. Given this finding, she was transferred to [facility] for cardiac catheterization.   Hospital Course     Underwent LHC with Dr. Gwenlyn Found noted above with successful PCI/DES to the [facility], with high grade lesion in the OM that was small and planned to treat medically. She was continued on Iv nitro related to her hypertension which improved and was weaned. Angiomax was continued for 4 hours post cath. Plan for DAPT with ASA/plavix for one year. EF was reported at 55-65%. Labs post cath showed stable Cr 0.97, and Hgb 12.1. Troponin was neg x1. Her medications were adjusted this admission to include increasing Crestor from 10mg  to 40mg  daily. Also added Avapro 75mg  daily for better blood pressure control. She worked with cardiac rehab without any chest pain.   She was seen by Dr. Ellyn Hack and determined stable for discharge home. Follow up in the office has been arranged. Medications are listed below. Will have her come in for labs on 8/3 given the addition of Avapro this admission.  _____________  Discharge Vitals Blood pressure (!) 163/71, pulse 61, temperature 97.7 F (36.5 C), temperature source Oral, resp. rate 12, height 5\' 3"  (1.6 m), weight 168 lb 10.4 oz (76.5 kg), SpO2 100 %.  Filed Weights   03/29/17 0958 03/30/17 0222  Weight: 163 lb (73.9  kg) 168 lb 10.4 oz (76.5 kg)  General appearance: alert, cooperative, appears stated age and no distress Neck: no adenopathy, no carotid bruit and no JVD Lungs: clear to auscultation bilaterally, normal percussion bilaterally and Nonlabored, good air movement Heart: regular rate and  rhythm, S1, S2 normal, no murmur, click, rub or gallop and normal apical impulse Abdomen: soft, non-tender; bowel sounds normal; no masses,  no organomegaly Extremities: extremities normal, atraumatic, no cyanosis or edema and She has mild swelling on the right forearm just distal to the elbow. Tender, but no obvious hematoma. Pulses: 2+ and symmetric Positive Barbeau exam - R radial Neurologic: Alert and oriented X 3, normal strength and tone. Normal symmetric reflexes. Normal coordination and gait   Labs & Radiologic Studies    CBC  Recent Labs  03/29/17 1100 03/30/17 0340  WBC 6.0 5.7  NEUTROABS 4.0  --   HGB 12.2 12.1  HCT 35.8* 36.4  MCV 88.8 87.3  PLT 228 010   Basic Metabolic Panel  Recent Labs  03/29/17 1100 03/30/17 0340  NA 137 138  K 3.4* 3.9  CL 102 107  CO2 26 24  GLUCOSE 107* 100*  BUN 19 13  CREATININE 0.85 0.97  CALCIUM 9.3 9.2   Liver Function Tests  Recent Labs  03/29/17 1100  AST 24  ALT 21  ALKPHOS 53  BILITOT 0.5  PROT 7.5  ALBUMIN 4.1   No results for input(s): LIPASE, AMYLASE in the last 72 hours. Cardiac Enzymes  Recent Labs  03/29/17 1100  TROPONINI <0.03   BNP Invalid input(s): POCBNP D-Dimer No results for input(s): DDIMER in the last 72 hours. Hemoglobin A1C No results for input(s): HGBA1C in the last 72 hours. Fasting Lipid Panel No results for input(s): CHOL, HDL, LDLCALC, TRIG, CHOLHDL, LDLDIRECT in the last 72 hours. Thyroid Function Tests No results for input(s): TSH, T4TOTAL, T3FREE, THYROIDAB in the last 72 hours.  Invalid input(s): FREET3 _____________  Nm Myocar Multi W/spect W/wall Motion / Ef  Result Date: 03/29/2017  Downsloping ST segment depression ST segment depression of 1.5 mm was noted during stress in the II, III, aVF, V4, V5 and V6 leads.  Abnormal ST segment depression noted in leads II, III, aVF, and V4 through V6 near peak achieved exercise at inadequate heart rate response and also following  Lexiscan. Patient reported symptoms concerning for angina as well. There is no associated imaging for this study as stress pictures were not obtained. Patient was taken to the ER for further evaluation.    Dg Chest Port 1 View  Result Date: 03/29/2017 CLINICAL DATA:  Chest pain. EXAM: PORTABLE CHEST 1 VIEW COMPARISON:  07/08/2011 report. FINDINGS: Mediastinum and hilar structures normal. Lungs are clear. No pleural effusion or pneumothorax. Heart size normal. No acute bony abnormality . IMPRESSION: No acute abnormality . Electronically Signed   By: Marcello Moores  Register   On: 03/29/2017 10:50   Disposition   Pt is being discharged home today in good condition.  Follow-up Plans & Appointments    Follow-up Information    Lendon Colonel, NP Follow up on 04/13/2017.   Specialties:  Nurse Practitioner, Radiology, Cardiology Why:  at 3:30pm for your follow up appt.  Contact information: [place] [address] [phone]        [facility] Follow up on 04/06/2017.   Why:  Please come to have your labs checked.  Contact information: [address] [address] [phone]  Discharge Instructions    AMB Referral to Cardiac Rehabilitation - Phase II    Complete by:  As directed    Diagnosis:  Coronary Stents   Call MD for:  redness, tenderness, or signs of infection (pain, swelling, redness, odor or green/yellow discharge around incision site)    Complete by:  As directed    Diet - low sodium heart healthy    Complete by:  As directed    Discharge instructions    Complete by:  As directed    Radial Site Care Refer to this sheet in the next few weeks. These instructions provide you with information on caring for yourself after your procedure. Your caregiver may also give you more specific instructions. Your treatment has been planned according to current medical practices, but problems sometimes occur. Call your caregiver if you  have any problems or questions after your procedure. HOME CARE INSTRUCTIONS You may shower the day after the procedure.Remove the bandage (dressing) and gently wash the site with plain soap and water.Gently pat the site dry.  Do not apply powder or lotion to the site.  Do not submerge the affected site in water for 3 to 5 days.  Inspect the site at least twice daily.  Do not flex or bend the affected arm for 24 hours.  No lifting over 5 pounds (2.3 kg) for 5 days after your procedure.  Do not drive home if you are discharged the same day of the procedure. Have someone else drive you.  You may drive 24 hours after the procedure unless otherwise instructed by your caregiver.  What to expect: Any bruising will usually fade within 1 to 2 weeks.  Blood that collects in the tissue (hematoma) may be painful to the touch. It should usually decrease in size and tenderness within 1 to 2 weeks.  SEEK IMMEDIATE MEDICAL CARE IF: You have unusual pain at the radial site.  You have redness, warmth, swelling, or pain at the radial site.  You have drainage (other than a small amount of blood on the dressing).  You have chills.  You have a fever or persistent symptoms for more than 72 hours.  You have a fever and your symptoms suddenly get worse.  Your arm becomes pale, cool, tingly, or numb.  You have heavy bleeding from the site. Hold pressure on the site.   PLEASE DO NOT MISS ANY DOSES OF YOUR PLAVIX!!! Also we increased your cholesterol medication from 10mg  to 40mg . Please check your blood pressures at home and bring log to your follow up appt.   Increase activity slowly    Complete by:  As directed       Discharge Medications   Current Discharge Medication List    START taking these medications   Details  aspirin 81 MG chewable tablet Chew 1 tablet (81 mg total) by mouth daily. Qty: 30 tablet, Refills: 0    clopidogrel (PLAVIX) 75 MG tablet Take 1 tablet (75 mg total) by mouth daily with  breakfast. Qty: 90 tablet, Refills: 1    irbesartan (AVAPRO) 75 MG tablet Take 1 tablet (75 mg total) by mouth daily. Qty: 90 tablet, Refills: 1    nitroGLYCERIN (NITROSTAT) 0.4 MG SL tablet Place 1 tablet (0.4 mg total) under the tongue every 5 (five) minutes as needed. Qty: 25 tablet, Refills: 3      CONTINUE these medications which have CHANGED   Details  rosuvastatin (CRESTOR) 40 MG tablet Take 1 tablet (40 mg total)  by mouth daily. Qty: 90 tablet, Refills: 1      CONTINUE these medications which have NOT CHANGED   Details  amLODipine (NORVASC) 10 MG tablet Take 10 mg by mouth daily.    atenolol-chlorthalidone (TENORETIC) 50-25 MG per tablet Take 1 tablet by mouth daily.    diclofenac sodium (VOLTAREN) 1 % GEL Apply topically 4 (four) times daily.    estradiol (ESTRACE) 2 MG tablet TAKE ONE TABLET BY MOUTH EVERY DAY Qty: 30 tablet, Refills: 6    montelukast (SINGULAIR) 10 MG tablet Take 10 mg by mouth at bedtime.    !! Multiple Vitamin (MULTIVITAMIN WITH MINERALS) TABS tablet Take 1 tablet by mouth daily.    !! Multiple Vitamins-Minerals (HAIR/SKIN/NAILS PO) Take 1 tablet by mouth daily.    polyethylene glycol-electrolytes (TRILYTE) 420 G solution Take 4,000 mLs by mouth as directed. Qty: 4000 mL, Refills: 0    potassium chloride (KLOR-CON) 20 MEQ packet Take 20 mEq by mouth daily.    sertraline (ZOLOFT) 25 MG tablet Take 25 mg by mouth daily as needed (Hot Flashes).    traMADol (ULTRAM) 50 MG tablet Take 50 mg by mouth as needed for pain.    UNABLE TO FIND ASA 500MG  AND CAFFEINE  32.5MG  AS NEEDED     !! - Potential duplicate medications found. Please discuss with provider.       Aspirin prescribed at discharge?  Yes High Intensity Statin Prescribed? (Lipitor 40-80mg  or Crestor 20-40mg ): Yes Beta Blocker Prescribed? Yes For EF <40%, was ACEI/ARB Prescribed? Yes ADP Receptor Inhibitor Prescribed? (i.e. Plavix etc.-Includes Medically Managed Patients): Yes For  EF <40%, Aldosterone Inhibitor Prescribed? No: EF ok Was EF assessed during THIS hospitalization? Yes Was Cardiac Rehab II ordered? (Included Medically managed Patients): Yes   Outstanding Labs/Studies   FLP/FLTs in 6 weeks if tolerating statin. BMET on 04/06/17  Duration of Discharge Encounter   Greater than 30 minutes including physician time.  Signed, Reino Bellis NP-C 03/30/2017, 9:23 AM    I have seen, examined and evaluated the patient this AM along with Reino Bellis, NP-C.  After reviewing all the available data and chart, we discussed the patients laboratory, study & physical findings as well as symptoms in detail. I agree with her findings, examination as well as impression recommendations as per our discussion.    Attending adjustments noted in italics.   Doing well following PCI the RCA. No further angina. Living without difficulty. She has limited tennis in the right forearm which we will treat with ice pack. No signs of hematoma.  Hypertensive here today, we'll add Avapro. Also given clear evidence of CAD will increase Crestor to 40 mg daily.  She will follow-up with Dr.Koneswaran.     Glenetta Hew, M.D., M.S. Interventional Cardiologist   Pager # [phone] Phone # [phone] [address] [place] [employer], [place] [address]

## 2017-03-30 NOTE — Progress Notes (Signed)
TR BAND REMOVAL  LOCATION:    right radial  DEFLATED PER PROTOCOL:    Yes.    TIME BAND OFF / DRESSING APPLIED:    00:45  SITE UPON ARRIVAL:    Level 0  SITE AFTER BAND REMOVAL:    Level 0  CIRCULATION SENSATION AND MOVEMENT:    Within Normal Limits   Yes.    COMMENTS:   Post TR band instructions given. Pt tolerated well.

## 2017-03-30 NOTE — Care Management Note (Signed)
Case Management Note  Patient Details  Name: Diane Parker MRN: [id number] Date of Birth: April 04, 1956  Subjective/Objective:                 Patient from home alone, plan for plavix.  PCP [place]   Action/Plan:  Abticipate DC to home when medically stable, no CM needs identified at this time. If concerns arise today call Carles Collet RN CM [phone].  Expected Discharge Date:                  Expected Discharge Plan:  Home/Self Care  In-House Referral:     Discharge planning Services  CM Consult  Post Acute Care Choice:    Choice offered to:     DME Arranged:    DME Agency:     HH Arranged:    HH Agency:     Status of Service:  In process, will continue to follow  If discussed at Long Length of Stay Meetings, dates discussed:    Additional Comments:  Carles Collet, RN 03/30/2017, 8:09 AM

## 2017-03-30 NOTE — Progress Notes (Signed)
CARDIAC REHAB PHASE I   PRE:  Rate/Rhythm: 23 SR  BP:  Supine: 169/72  Sitting:   Standing:    SaO2:   MODE:  Ambulation: 800 ft   POST:  Rate/Rhythm: 75 SR  BP:  Supine:   Sitting: 169/72  Standing:    SaO2:  0900-0945 Pt walked 800 ft with steady gait. No CP.  BP elevated this morning but no change with walk. Education completed with pt who voiced understanding. Stressed importance of plavix with stent. Reviewed risk factors, ex ed , NTG use, heart healthy diet and CRP 2. Referring to [place] Phase 2.   Graylon Good, RN BSN  03/30/2017 9:40 AM

## 2017-03-30 NOTE — Telephone Encounter (Signed)
Patient contacted regarding discharge from [facility] on 03/30/17.  Patient understands to follow up with provider Arnold Long NP on 04/13/17 at 3:30 pm at [facility]. Patient understands discharge instructions? yes Patient understands medications and regiment? yes Patient understands to bring all medications to this visit? yes

## 2017-04-04 ENCOUNTER — Telehealth: Payer: Self-pay | Admitting: Cardiovascular Disease

## 2017-04-04 NOTE — Telephone Encounter (Signed)
That would be fine 

## 2017-04-04 NOTE — Telephone Encounter (Signed)
Will forward to Dr. Koneswaran to advise.  

## 2017-04-04 NOTE — Telephone Encounter (Signed)
Pt had Stent placed on 7/26 and she's wanting to know if it's ok for her to take milk of magnesia--ok to leave it on answering machine if she's not home

## 2017-04-04 NOTE — Telephone Encounter (Signed)
Pt made aware

## 2017-04-06 ENCOUNTER — Other Ambulatory Visit (HOSPITAL_COMMUNITY)
Admission: RE | Admit: 2017-04-06 | Discharge: 2017-04-06 | Disposition: A | Discharge status: Home / Self Care | Payer: BLUE CROSS/BLUE SHIELD | Source: Ambulatory Visit | Attending: Cardiology | Admitting: Cardiology

## 2017-04-06 ENCOUNTER — Telehealth: Payer: Self-pay | Admitting: Adult Health

## 2017-04-06 DIAGNOSIS — Z79899 Other long term (current) drug therapy: Secondary | ICD-10-CM | POA: Insufficient documentation

## 2017-04-06 LAB — BASIC METABOLIC PANEL
Anion gap: 8 (ref 5–15)
BUN: 26 mg/dL — ABNORMAL HIGH (ref 6–20)
CO2: 27 mmol/L (ref 22–32)
Calcium: 9.1 mg/dL (ref 8.9–10.3)
Chloride: 100 mmol/L — ABNORMAL LOW (ref 101–111)
Creatinine, Ser: 1.11 mg/dL — ABNORMAL HIGH (ref 0.44–1.00)
GFR calc Af Amer: >60 mL/min (ref >60)
GFR calc non Af Amer: 52 mL/min — ABNORMAL LOW (ref >60)
Glucose, Bld: 112 mg/dL — ABNORMAL HIGH (ref 65–99)
Potassium: 3.5 mmol/L (ref 3.5–5.1)
Sodium: 135 mmol/L (ref 135–145)

## 2017-04-06 NOTE — Telephone Encounter (Signed)
Pt states that's she's having some swelling where they went in to do her stent

## 2017-04-06 NOTE — Telephone Encounter (Signed)
Pt stopped by to get lab slip and showed me right inner wrist where she had catheterization.I noted very minimal swelling, no bounding pulse or heat noted.She was carrying heavy purse on that side.Advised her to carry on left shoulder.states she goes back to work next week and will be lifting a lot.

## 2017-04-13 ENCOUNTER — Ambulatory Visit (INDEPENDENT_AMBULATORY_CARE_PROVIDER_SITE_OTHER): Payer: BLUE CROSS/BLUE SHIELD | Admitting: Adult Health

## 2017-04-13 ENCOUNTER — Encounter: Payer: Self-pay | Admitting: Adult Health

## 2017-04-13 ENCOUNTER — Encounter: Payer: Self-pay | Admitting: *Deleted

## 2017-04-13 VITALS — BP 124/80 | HR 65 | Wt 166.2 lb

## 2017-04-13 DIAGNOSIS — I10 Essential (primary) hypertension: Secondary | ICD-10-CM | POA: Diagnosis not present

## 2017-04-13 DIAGNOSIS — Z9861 Coronary angioplasty status: Secondary | ICD-10-CM

## 2017-04-13 DIAGNOSIS — I2510 Atherosclerotic heart disease of native coronary artery without angina pectoris: Secondary | ICD-10-CM

## 2017-04-13 DIAGNOSIS — E7800 Pure hypercholesterolemia, unspecified: Secondary | ICD-10-CM

## 2017-04-13 MED ORDER — PANTOPRAZOLE SODIUM 20 MG PO TBEC: 20.0000 mg | DELAYED_RELEASE_TABLET | Freq: Every day | ORAL | 0 refills | Status: DC

## 2017-04-13 MED ORDER — DILTIAZEM HCL ER COATED BEADS 180 MG PO CP24: 180.0000 mg | ORAL_CAPSULE | Freq: Every day | ORAL | 3 refills | Status: DC

## 2017-04-13 MED ORDER — PANTOPRAZOLE SODIUM 20 MG PO TBEC: 20.0000 mg | DELAYED_RELEASE_TABLET | Freq: Every day | ORAL | 3 refills | Status: DC

## 2017-04-13 MED ORDER — CLOPIDOGREL BISULFATE 75 MG PO TABS: 75.0000 mg | ORAL_TABLET | Freq: Every day | ORAL | 3 refills | Status: DC

## 2017-04-13 MED ORDER — IRBESARTAN 75 MG PO TABS: 75.0000 mg | ORAL_TABLET | Freq: Every day | ORAL | 3 refills | Status: DC

## 2017-04-13 MED ORDER — ROSUVASTATIN CALCIUM 40 MG PO TABS: 40.0000 mg | ORAL_TABLET | Freq: Every day | ORAL | 3 refills | Status: DC

## 2017-04-13 NOTE — Progress Notes (Signed)
Cardiology Office Note   Date:  04/13/2017   ID:  Diane Parker, DOB 07/26/1956, MRN [id number]  PCP:  Redmond School, MD  Cardiologist:  Bronson Ing Chief Complaint  Patient presents with  . Hypertension  . Palpitations  . Coronary Artery Disease      History of Present Illness: Diane Parker is a 61 y.o. female who presents for ongoing assessment and management of hypertension, hyperlipidemia, palpitations. The patient was recently admitted to the hospital for exertional chest pain and scheduled for a stress test. Stress test was found to be positive for ischemia and therefore patient was referred for cardiac catheterization.  Cardiac catheterization revealed a 99% stenosed mid RCA lesion, 95% ostial diagonal lesion, with a proximal LAD lesion of 40%. The patient underwent successful PCI with drug-eluting stent of a subtotal mid dominant RCA stenosis with left-to-right collaterals. She was sent home on dual platelet therapy, with Plavix and aspirin, and recommended to continue for one year uninterrupted.  The patient did come by our office on 04/06/2017 to pick up a lab slip and complained of her right wrist catheterization insertion site being very tender and swollen. This was examined by Barbarann Ehlers RN, and did not find it to be infected overly edematous. The patient was advised not to carry her purse in that arm and allow for ongoing healing.   She is doing very well today anxious to return to work next week. She has been walking every day, riding a stationary bicycle. She is interested in cardiac rehabilitation. She's had no recurrent chest pain. She has her energy back, and is very grateful. She offers no complaints of hemoptysis, excessive bruising, dizziness, or dyspnea. She is having a good bit of GERD.  Past Medical History:  Diagnosis Date  . Acute diverticulitis 03/28/2013   Seen on CT - treated with flagyl an cipro  . Arthritis    "shoulders, knees, hips"  (03/29/2017)  . CAD (coronary artery disease), native coronary artery    03/29/17 PCI/DES to the mRCA, EF normal   . Essential hypertension   . GERD (gastroesophageal reflux disease)   . Headache    "bad ones lately; maybe q other day" (03/29/2017)  . High cholesterol     Past Surgical History:  Procedure Laterality Date  . ABDOMINAL HYSTERECTOMY    . COLONOSCOPY  11/08/2007   [id number], rectosigmoid junction/hyperplastic-appearing polypoid    mucosa, 1 of these areas biopsied.  Otherwise, normal rectum/Left-sided transverse diverticula/Diminutive polyp just distal to the ileocecal valve. Hyperplastic and adenomatous polyp.   . COLONOSCOPY N/A 06/26/2013   [id number] diverticulosis. Colonic polyps removed  . CORONARY ANGIOPLASTY WITH STENT PLACEMENT  03/29/2017  . CORONARY STENT INTERVENTION N/A 03/29/2017   Procedure: Coronary Stent Intervention;  Surgeon: Lorretta Harp, MD;  Location: [facility];  Service: Cardiovascular;  Laterality: N/A;  . DILATION AND CURETTAGE OF UTERUS    . ESOPHAGOGASTRODUODENOSCOPY  2008   RMR: normal  . LEFT HEART CATH AND CORONARY ANGIOGRAPHY N/A 03/29/2017   Procedure: Left Heart Cath and Coronary Angiography;  Surgeon: Lorretta Harp, MD;  Location: [facility];  Service: Cardiovascular;  Laterality: N/A;  . TUBAL LIGATION       Current Outpatient Prescriptions  Medication Sig Dispense Refill  . aspirin 81 MG chewable tablet Chew 1 tablet (81 mg total) by mouth daily. 30 tablet 0  . atenolol-chlorthalidone (TENORETIC) 50-25 MG per tablet Take 1 tablet by mouth daily.    . clopidogrel (PLAVIX)  75 MG tablet Take 1 tablet (75 mg total) by mouth daily with breakfast. 90 tablet 1  . diclofenac sodium (VOLTAREN) 1 % GEL Apply topically 4 (four) times daily.    Marland Kitchen estradiol (ESTRACE) 2 MG tablet TAKE ONE TABLET BY MOUTH EVERY DAY 30 tablet 6  . irbesartan (AVAPRO) 75 MG tablet Take 1 tablet (75 mg total) by mouth daily. 90 tablet 1    . montelukast (SINGULAIR) 10 MG tablet Take 10 mg by mouth at bedtime.    . Multiple Vitamin (MULTIVITAMIN WITH MINERALS) TABS tablet Take 1 tablet by mouth daily.    . Multiple Vitamins-Minerals (HAIR/SKIN/NAILS PO) Take 1 tablet by mouth daily.    . polyethylene glycol-electrolytes (TRILYTE) 420 G solution Take 4,000 mLs by mouth as directed. 4000 mL 0  . potassium chloride (KLOR-CON) 20 MEQ packet Take 20 mEq by mouth daily.    . rosuvastatin (CRESTOR) 40 MG tablet Take 1 tablet (40 mg total) by mouth daily. 90 tablet 1  . sertraline (ZOLOFT) 25 MG tablet Take 25 mg by mouth daily as needed (Hot Flashes).    . traMADol (ULTRAM) 50 MG tablet Take 50 mg by mouth as needed for pain.    Marland Kitchen UNABLE TO FIND ASA 500MG  AND CAFFEINE  32.5MG  AS NEEDED     Current Facility-Administered Medications  Medication Dose Route Frequency Provider Last Rate Last Dose  . acetaminophen (TYLENOL) tablet 650 mg  650 mg Oral Q4H PRN Lendon Colonel, NP      . atorvastatin (LIPITOR) tablet 40 mg  40 mg Oral q1800 Lendon Colonel, NP      . metoprolol tartrate (LOPRESSOR) tablet 25 mg  25 mg Oral BID Lendon Colonel, NP      . nitroGLYCERIN (NITROSTAT) SL tablet 0.4 mg  0.4 mg Sublingual Q5 Min x 3 PRN Lendon Colonel, NP      . ondansetron [facility]) 4 mg in sodium chloride 0.9 % 50 mL IVPB  4 mg Intravenous Q6H PRN Lendon Colonel, NP        Allergies:   Patient has no known allergies.    Social History:  The patient  reports that she quit smoking about 28 years ago. Her smoking use included Cigarettes. She has a 0.36 pack-year smoking history. She has never used smokeless tobacco. She reports that she drinks about 1.8 oz of alcohol per week . She reports that she uses drugs, including Marijuana.   Family History:  The patient's family history includes Aneurysm in her mother; Cancer in her cousin; Heart disease in her father.    ROS: All other systems are reviewed and negative. Unless  otherwise mentioned in H&P    PHYSICAL EXAM: VS:  Wt 166 lb 3.2 oz (75.4 kg)   BMI 29.44 kg/m  , BMI Body mass index is 29.44 kg/m. GEN: Well nourished, well developed, in no acute distress  HEENT: normal  Neck: no JVD, carotid bruits, or masses Cardiac: RRR; no murmurs, rubs, or gallops,no edema  Respiratory:  clear to auscultation bilaterally, normal work of breathing GI: soft, nontender, nondistended, + BS MS: no deformity or atrophy  Skin: warm and dry, no rash Neuro:  Strength and sensation are intact Psych: euthymic mood, full affect    Recent Labs: 03/29/2017: ALT 21 03/30/2017: Hemoglobin 12.1; Platelets 244 04/06/2017: BUN 26; Creatinine, Ser 1.11; Potassium 3.5; Sodium 135    Lipid Panel No results found for: CHOL, TRIG, HDL, CHOLHDL, VLDL, LDLCALC, LDLDIRECT  Wt Readings from Last 3 Encounters:  04/13/17 166 lb 3.2 oz (75.4 kg)  03/30/17 168 lb 10.4 oz (76.5 kg)  03/19/17 163 lb (73.9 kg)      Other studies Reviewed:  Conclusion 03/29/2017    Prox LAD lesion, 40 %stenosed.  Ost 1st Diag lesion, 95 %stenosed.  Mid RCA lesion, 99 %stenosed.  Post intervention, there is a 0% residual stenosis.  A stent was successfully placed.  The left ventricular systolic function is normal.  LV end diastolic pressure is normal.  The left ventricular ejection fraction is 55-65% by visual estimate     ASSESSMENT AND PLAN:  1.  Coronary artery disease: 99% occluded  Mid RCA. The patient now has drug-eluting stent with 0% stenosis. She is extremely grateful , feels much better has become more active and is ready to return to work. She's been medically compliant.  I have given her refills on all of her medications the condition she uses"[facility]"  For her meds. She will be referred to cardiac rehabilitation. She is given a letter to return to work on 04/18/2017.  2.  Hypertension: excellent control of blood pressure on current medication regimen. She was taken  off of amlodipine, and started on Cartia XT.  She is tolerating it well , and not having any excessive dizziness or fluid retention.  3.  Hypercholesterolemia: the patient will continue on statin therapy.   Current medicines are reviewed at length with the patient today.    Labs/ tests ordered today include:  Phill Myron. [place] Pugh, ANP, AACC   04/13/2017 3:32 PM    [facility] [address], [place], [place] [address] Phone: [phone]; Fax: [phone]

## 2017-04-13 NOTE — Patient Instructions (Addendum)
Medication Instructions:   Your physician has recommended you make the following change in your medication:   Start pantoprazole 20 mg by mouth daily.  Continue all other medications the same.  Labwork:  NONE  Testing/Procedures:  NONE  Follow-Up:  Your physician recommends that you schedule a follow-up appointment in: 3 months.  Any Other Special Instructions Will Be Listed Below (If Applicable).  You are being referred to Cardiac Rehab.  If you need a refill on your cardiac medications before your next appointment, please call your pharmacy.

## 2017-04-16 ENCOUNTER — Telehealth: Payer: Self-pay | Admitting: Adult Health

## 2017-04-16 ENCOUNTER — Telehealth: Payer: Self-pay | Admitting: Cardiology

## 2017-04-16 ENCOUNTER — Other Ambulatory Visit: Payer: Self-pay | Admitting: *Deleted

## 2017-04-16 MED ORDER — IRBESARTAN 75 MG PO TABS: 75.0000 mg | ORAL_TABLET | Freq: Every day | ORAL | 3 refills | Status: DC

## 2017-04-16 MED ORDER — PANTOPRAZOLE SODIUM 20 MG PO TBEC: 20.0000 mg | DELAYED_RELEASE_TABLET | Freq: Every day | ORAL | 3 refills | Status: DC

## 2017-04-16 MED ORDER — ROSUVASTATIN CALCIUM 40 MG PO TABS: 40.0000 mg | ORAL_TABLET | Freq: Every day | ORAL | 3 refills | Status: DC

## 2017-04-16 NOTE — Telephone Encounter (Signed)
New message    Pt is calling back for Arbie Cookey about lab results.

## 2017-04-16 NOTE — Telephone Encounter (Signed)
Patient informed that her medication list is up to date.

## 2017-04-16 NOTE — Telephone Encounter (Signed)
I lmtcb to go over results.

## 2017-04-16 NOTE — Telephone Encounter (Signed)
Patient was seen in the [facility] on Friday. Was told to call and give name of a medication Cartia XL 180/24.please call patient.

## 2017-04-17 ENCOUNTER — Telehealth: Payer: Self-pay | Admitting: *Deleted

## 2017-04-17 NOTE — Telephone Encounter (Signed)
-----   Message from Cheryln Manly, NP sent at 04/16/2017  7:32 AM EDT ----- Please inform the patient her recent blood work is normal.

## 2017-04-17 NOTE — Telephone Encounter (Signed)
Patient informed. Patient seen in the office on 04/13/17 and labs were reviewed by Jory Sims NP.

## 2017-04-19 ENCOUNTER — Other Ambulatory Visit: Payer: Self-pay

## 2017-05-09 ENCOUNTER — Telehealth: Payer: Self-pay | Admitting: *Deleted

## 2017-05-09 NOTE — Telephone Encounter (Signed)
-----   Message from Herminio Commons, MD sent at 05/09/2017  8:33 AM EDT ----- Yes please.  ----- Message ----- From: Massie Maroon, CMA Sent: 05/09/2017   7:41 AM To: Herminio Commons, MD  This pt was just seen by Curt Bears on 8/10 with 3 month f/u - do you want [place] to reschedule? She is on your schedule for tomorrow  [facility]

## 2017-05-09 NOTE — Telephone Encounter (Signed)
LM on pt VM.

## 2017-05-10 ENCOUNTER — Telehealth: Payer: Self-pay | Admitting: Cardiovascular Disease

## 2017-05-10 ENCOUNTER — Ambulatory Visit: Payer: BLUE CROSS/BLUE SHIELD | Admitting: Cardiovascular Disease

## 2017-05-10 NOTE — Telephone Encounter (Signed)
Does she have a way of checking BP and HR when she feels like this?

## 2017-05-10 NOTE — Telephone Encounter (Signed)
Patient states that since starting the new medications from hospital (aspirin 81 mg, plavix 75 mg, cartia 180 mg, irbesartan 75 mg) she has been very sleepy and states she its becoming hard to go to work due to fatigue. Patient is fine with not coming in to see Dr. Bronson Ing until November if we can handle this situation via phone note.

## 2017-05-10 NOTE — Telephone Encounter (Signed)
Patient notified and verbalized she does have a way to check BP and HR at home. Patient stated she would check next time she had an episode and give office a call back.

## 2017-05-10 NOTE — Telephone Encounter (Signed)
Patient called stating that she was suppose to have appointment today however, Dr. Bronson Ing suggested that she come Back in November.  She has concerned's  About her medications. She states that some of her medications are making Her feel fatigue.  Please call [phone].

## 2017-06-07 ENCOUNTER — Encounter (HOSPITAL_COMMUNITY): Admission: RE | Admit: 2017-06-07 | Payer: BLUE CROSS/BLUE SHIELD | Source: Ambulatory Visit

## 2017-06-14 ENCOUNTER — Encounter (HOSPITAL_COMMUNITY): Payer: BLUE CROSS/BLUE SHIELD

## 2017-07-09 ENCOUNTER — Encounter (HOSPITAL_COMMUNITY): Payer: BLUE CROSS/BLUE SHIELD

## 2017-07-13 ENCOUNTER — Ambulatory Visit (INDEPENDENT_AMBULATORY_CARE_PROVIDER_SITE_OTHER): Payer: BLUE CROSS/BLUE SHIELD | Admitting: Cardiovascular Disease

## 2017-07-13 ENCOUNTER — Encounter: Payer: Self-pay | Admitting: Cardiovascular Disease

## 2017-07-13 VITALS — BP 140/80 | HR 87 | Ht 63.0 in | Wt 165.0 lb

## 2017-07-13 DIAGNOSIS — Z955 Presence of coronary angioplasty implant and graft: Secondary | ICD-10-CM | POA: Diagnosis not present

## 2017-07-13 DIAGNOSIS — E7800 Pure hypercholesterolemia, unspecified: Secondary | ICD-10-CM

## 2017-07-13 DIAGNOSIS — R531 Weakness: Secondary | ICD-10-CM

## 2017-07-13 DIAGNOSIS — I10 Essential (primary) hypertension: Secondary | ICD-10-CM | POA: Diagnosis not present

## 2017-07-13 DIAGNOSIS — I25118 Atherosclerotic heart disease of native coronary artery with other forms of angina pectoris: Secondary | ICD-10-CM | POA: Diagnosis not present

## 2017-07-13 NOTE — Progress Notes (Signed)
SUBJECTIVE: The patient presents for routine follow-up.  She underwent RCA stent placement for a mid RCA 99% stenosis.  There is also stenosis of 95% in the ostium of the first diagonal branch.  LVEDP was normal and left ventricular systolic function was normal.  BUN 26 and creatinine 1.11 on 04/06/17.  She denies chest pain, palpitations, shortness of breath.  She said she has been disoriented for the past 25 days but then says "I am just not all together ".  She says "it is not right" and "I'm not right".  She is not able to elaborate on this any further.  She has had some balance issues but denies falling.  She has had some chest congestion for the past 3 weeks after getting her flu shot.  She has been taking 1 tramadol and 2 Tylenol daily for arthritis.  His blood pressures at home have ranged in the 096/28 range and systolic readings have never been higher than 140.  She has diffuse arthritis in her neck and her back.      Review of Systems: As per "subjective", otherwise negative.  No Known Allergies  Current Outpatient Medications  Medication Sig Dispense Refill  . aspirin 81 MG chewable tablet Chew 1 tablet (81 mg total) by mouth daily. 30 tablet 0  . atenolol-chlorthalidone (TENORETIC) 50-25 MG per tablet Take 1 tablet by mouth daily.    . clopidogrel (PLAVIX) 75 MG tablet Take 1 tablet (75 mg total) by mouth daily with breakfast. 90 tablet 3  . diclofenac sodium (VOLTAREN) 1 % GEL Apply topically 4 (four) times daily.    Marland Kitchen diltiazem (CARTIA XT) 180 MG 24 hr capsule Take 1 capsule (180 mg total) by mouth daily. 90 capsule 3  . estradiol (ESTRACE) 2 MG tablet TAKE ONE TABLET BY MOUTH EVERY DAY 30 tablet 6  . irbesartan (AVAPRO) 75 MG tablet Take 1 tablet (75 mg total) by mouth daily. 90 tablet 3  . montelukast (SINGULAIR) 10 MG tablet Take 10 mg by mouth at bedtime.    . Multiple Vitamin (MULTIVITAMIN WITH MINERALS) TABS tablet Take 1 tablet by mouth daily.    . Multiple  Vitamins-Minerals (HAIR/SKIN/NAILS PO) Take 1 tablet by mouth daily.    . nitroGLYCERIN (NITROSTAT) 0.4 MG SL tablet Place 1 tablet under the tongue every 5 (five) minutes x 3 doses as needed.    . pantoprazole (PROTONIX) 20 MG tablet Take 1 tablet (20 mg total) by mouth daily. 90 tablet 3  . polyethylene glycol-electrolytes (TRILYTE) 420 G solution Take 4,000 mLs by mouth as directed. 4000 mL 0  . potassium chloride (KLOR-CON) 20 MEQ packet Take 20 mEq by mouth daily.    . rosuvastatin (CRESTOR) 40 MG tablet Take 1 tablet (40 mg total) by mouth daily. 90 tablet 3  . sertraline (ZOLOFT) 25 MG tablet Take 25 mg by mouth daily as needed (Hot Flashes).    . temazepam (RESTORIL) 15 MG capsule Take 1 capsule by mouth at bedtime.    . traMADol (ULTRAM) 50 MG tablet Take 50 mg by mouth as needed for pain.    Marland Kitchen UNABLE TO FIND ASA 500MG  AND CAFFEINE  32.5MG  AS NEEDED     Current Facility-Administered Medications  Medication Dose Route Frequency Provider Last Rate Last Dose  . acetaminophen (TYLENOL) tablet 650 mg  650 mg Oral Q4H PRN Lendon Colonel, NP        Past Medical History:  Diagnosis Date  . Acute diverticulitis  03/28/2013   Seen on CT - treated with flagyl an cipro  . Arthritis    "shoulders, knees, hips" (03/29/2017)  . CAD (coronary artery disease), native coronary artery    03/29/17 PCI/DES to the mRCA, EF normal   . Essential hypertension   . GERD (gastroesophageal reflux disease)   . Headache    "bad ones lately; maybe q other day" (03/29/2017)  . High cholesterol     Past Surgical History:  Procedure Laterality Date  . ABDOMINAL HYSTERECTOMY    . COLONOSCOPY  11/08/2007   [id number], rectosigmoid junction/hyperplastic-appearing polypoid    mucosa, 1 of these areas biopsied.  Otherwise, normal rectum/Left-sided transverse diverticula/Diminutive polyp just distal to the ileocecal valve. Hyperplastic and adenomatous polyp.   . CORONARY ANGIOPLASTY WITH STENT PLACEMENT   03/29/2017  . DILATION AND CURETTAGE OF UTERUS    . ESOPHAGOGASTRODUODENOSCOPY  2008   RMR: normal  . TUBAL LIGATION      Social History   Socioeconomic History  . Marital status: Single    Spouse name: Not on file  . Number of children: Not on file  . Years of education: Not on file  . Highest education level: Not on file  Social Needs  . Financial resource strain: Not on file  . Food insecurity - worry: Not on file  . Food insecurity - inability: Not on file  . Transportation needs - medical: Not on file  . Transportation needs - non-medical: Not on file  Occupational History  . Not on file  Tobacco Use  . Smoking status: Former Smoker    Packs/day: 0.12    Years: 3.00    Pack years: 0.36    Types: Cigarettes    Last attempt to quit: 1990    Years since quitting: 28.8  . Smokeless tobacco: Never Used  Substance and Sexual Activity  . Alcohol use: Yes    Alcohol/week: 1.8 oz    Types: 3 Cans of beer per week  . Drug use: Yes    Types: Marijuana    Comment: 03/29/2017 "smoked a little weed when I was young"  . Sexual activity: No    Birth control/protection: Surgical  Other Topics Concern  . Not on file  Social History Narrative  . Not on file     Vitals:   07/13/17 1551  BP: 140/80  Pulse: 87  SpO2: 98%  Weight: 165 lb (74.8 kg)  Height: 5\' 3"  (1.6 m)    Wt Readings from Last 3 Encounters:  07/13/17 165 lb (74.8 kg)  04/13/17 166 lb 3.2 oz (75.4 kg)  03/30/17 168 lb 10.4 oz (76.5 kg)     PHYSICAL EXAM General: NAD HEENT: Normal. Neck: No JVD, no thyromegaly. Lungs: Clear to auscultation bilaterally with normal respiratory effort. CV: Regular rate and rhythm, normal S1/S2, no S3/S4, no murmur. No pretibial or periankle edema.  No carotid bruit.   Abdomen: Soft, nontender, no distention.  Neurologic: Alert and oriented.  Psych: Normal affect. Skin: Normal. Musculoskeletal: No gross deformities.    ECG: Most recent ECG  reviewed.   Labs: Lab Results  Component Value Date/Time   K 3.5 04/06/2017 03:29 PM   BUN 26 (H) 04/06/2017 03:29 PM   CREATININE 1.11 (H) 04/06/2017 03:29 PM   CREATININE 0.99 03/26/2013 03:38 PM   ALT 21 03/29/2017 11:00 AM   HGB 12.1 03/30/2017 03:40 AM     Lipids: No results found for: LDLCALC, LDLDIRECT, CHOL, TRIG, HDL     ASSESSMENT AND  PLAN: 1.  Coronary disease status post RCA stent placement: Symptomatically stable.  Continue aspirin, atenolol, Plavix, and Crestor.  2.  Hypertension: This is mildly elevated.  However, it appears to be controlled at home.  No changes to therapy.  3.  Hyperlipidemia: Continue Crestor.  4.  Generalized weakness: I will obtain a basic metabolic panel and CBC.  I encouraged her to speak with her PCP early next week.    Disposition: Follow up 6 months   Kate Sable, M.D., F.A.C.C.

## 2017-07-13 NOTE — Patient Instructions (Addendum)
Medication Instructions:  Continue all current medications.  Labwork:  CBC, BMET - orders given today.   Office will contact with results via phone or letter.    Testing/Procedures: none  Follow-Up: Your physician wants you to follow up in: 6 months.  You will receive a reminder letter in the mail one-two months in advance.  If you don't receive a letter, please call our office to schedule the follow up appointment   Any Other Special Instructions Will Be Listed Below (If Applicable).  If you need a refill on your cardiac medications before your next appointment, please call your pharmacy.  

## 2017-07-23 ENCOUNTER — Telehealth: Payer: Self-pay | Admitting: *Deleted

## 2017-07-23 NOTE — Telephone Encounter (Signed)
CBC -  Notes recorded by Herminio Commons, MD on 07/16/2017 at 9:59 AM EST She is anemic and this will need further workup. Please forward to PCP.  BMET -  Notes recorded by Herminio Commons, MD on 07/19/2017 at 12:41 PM EST ok

## 2017-07-23 NOTE — Telephone Encounter (Signed)
Notes recorded by Laurine Blazer, LPN on 50/27/7412 at 10:19 AM EST Patient notified. Copy to pmd

## 2017-09-18 NOTE — Congregational Nurse Program (Signed)
Congregational Nurse Program Note  Date of Encounter: 09/17/2017  Past Medical History: Past Medical History:  Diagnosis Date  . Acute diverticulitis 03/28/2013   Seen on CT - treated with flagyl an cipro  . Arthritis    "shoulders, knees, hips" (03/29/2017)  . CAD (coronary artery disease), native coronary artery    03/29/17 PCI/DES to the mRCA, EF normal   . Essential hypertension   . GERD (gastroesophageal reflux disease)   . Headache    "bad ones lately; maybe q other day" (03/29/2017)  . High cholesterol     Encounter Details: CNP Questionnaire - 09/17/17 1551      Questionnaire   Patient Status  Not Applicable    Race  Black or African American    Location Patient [place]  Not Applicable    Uninsured  Uninsured (NEW 1x/quarter)    Food  Yes, have food insecurities    Housing/Utilities  Yes, have permanent housing    Transportation  No transportation needs    Interpersonal Safety  Yes, feel physically and emotionally safe where you currently live    Medication  Yes, have medication insecurities    Medical Provider  No    Referrals  [facility]    ED Visit Averted  Not Applicable      Client was seen at [facility] to assist with healthcare needs.  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 Assisted client with an appointment with [facility] to apply to establish a PCP at [facility] in [place], [place].  Client has a history of HTN, CAD, Hyperlipidemia and is on medications.  Appointment scheduled. Client spoke with Earmon Phoenix at [facility] and was given brochure. Encouraged to call if any further questions or needs.  Tarri Fuller CNP [phone].

## 2018-04-01 NOTE — Progress Notes (Deleted)
Cardiology Office Note    Date:  04/01/2018   ID:  Diane Parker, DOB 06-03-1956, MRN [id number]  PCP:  Redmond School, MD  Cardiologist: Kate Sable, MD    No chief complaint on file.   History of Present Illness:    Diane Parker is a 62 y.o. female with past medical history of CAD (s/p DES to RCA in 03/2017), HTN, HLD, and GERD who presents to the office today for 37-month follow-up.   She was last examined by Dr. Bronson Ing in 07/2017 and reported some episodes of disorientation but denied any recent chest pain or dyspnea on exertion. She was continued on her current medication regimen at that time, including ASA and Plavix.     Past Medical History:  Diagnosis Date  . Acute diverticulitis 03/28/2013   Seen on CT - treated with flagyl an cipro  . Arthritis    "shoulders, knees, hips" (03/29/2017)  . CAD (coronary artery disease), native coronary artery    03/29/17 PCI/DES to the mRCA, EF normal   . Essential hypertension   . GERD (gastroesophageal reflux disease)   . Headache    "bad ones lately; maybe q other day" (03/29/2017)  . High cholesterol     Past Surgical History:  Procedure Laterality Date  . ABDOMINAL HYSTERECTOMY    . COLONOSCOPY  11/08/2007   [id number], rectosigmoid junction/hyperplastic-appearing polypoid    mucosa, 1 of these areas biopsied.  Otherwise, normal rectum/Left-sided transverse diverticula/Diminutive polyp just distal to the ileocecal valve. Hyperplastic and adenomatous polyp.   . COLONOSCOPY N/A 06/26/2013   [id number] diverticulosis. Colonic polyps removed  . CORONARY ANGIOPLASTY WITH STENT PLACEMENT  03/29/2017  . CORONARY STENT INTERVENTION N/A 03/29/2017   Procedure: Coronary Stent Intervention;  Surgeon: Lorretta Harp, MD;  Location: [facility];  Service: Cardiovascular;  Laterality: N/A;  . DILATION AND CURETTAGE OF UTERUS    . ESOPHAGOGASTRODUODENOSCOPY  2008   RMR: normal  . LEFT HEART CATH AND  CORONARY ANGIOGRAPHY N/A 03/29/2017   Procedure: Left Heart Cath and Coronary Angiography;  Surgeon: Lorretta Harp, MD;  Location: [facility];  Service: Cardiovascular;  Laterality: N/A;  . TUBAL LIGATION      Current Medications: Outpatient Medications Prior to Visit  Medication Sig Dispense Refill  . aspirin 81 MG chewable tablet Chew 1 tablet (81 mg total) by mouth daily. 30 tablet 0  . atenolol-chlorthalidone (TENORETIC) 50-25 MG per tablet Take 1 tablet by mouth daily.    . clopidogrel (PLAVIX) 75 MG tablet Take 1 tablet (75 mg total) by mouth daily with breakfast. 90 tablet 3  . diclofenac sodium (VOLTAREN) 1 % GEL Apply topically 4 (four) times daily.    Marland Kitchen diltiazem (CARTIA XT) 180 MG 24 hr capsule Take 1 capsule (180 mg total) by mouth daily. 90 capsule 3  . estradiol (ESTRACE) 2 MG tablet TAKE ONE TABLET BY MOUTH EVERY DAY 30 tablet 6  . irbesartan (AVAPRO) 75 MG tablet Take 1 tablet (75 mg total) by mouth daily. 90 tablet 3  . montelukast (SINGULAIR) 10 MG tablet Take 10 mg by mouth at bedtime.    . Multiple Vitamin (MULTIVITAMIN WITH MINERALS) TABS tablet Take 1 tablet by mouth daily.    . Multiple Vitamins-Minerals (HAIR/SKIN/NAILS PO) Take 1 tablet by mouth daily.    . nitroGLYCERIN (NITROSTAT) 0.4 MG SL tablet Place 1 tablet under the tongue every 5 (five) minutes x 3 doses as needed.    . pantoprazole (PROTONIX)  20 MG tablet Take 1 tablet (20 mg total) by mouth daily. 90 tablet 3  . polyethylene glycol-electrolytes (TRILYTE) 420 G solution Take 4,000 mLs by mouth as directed. 4000 mL 0  . potassium chloride (KLOR-CON) 20 MEQ packet Take 20 mEq by mouth daily.    . rosuvastatin (CRESTOR) 40 MG tablet Take 1 tablet (40 mg total) by mouth daily. 90 tablet 3  . sertraline (ZOLOFT) 25 MG tablet Take 25 mg by mouth daily as needed (Hot Flashes).    . temazepam (RESTORIL) 15 MG capsule Take 1 capsule by mouth at bedtime.    . traMADol (ULTRAM) 50 MG tablet Take 50 mg by  mouth as needed for pain.    Marland Kitchen UNABLE TO FIND ASA 500MG  AND CAFFEINE  32.5MG  AS NEEDED     No facility-administered medications prior to visit.      Allergies:   Patient has no known allergies.   Social History   Socioeconomic History  . Marital status: Single    Spouse name: Not on file  . Number of children: Not on file  . Years of education: Not on file  . Highest education level: Not on file  Occupational History  . Not on file  Social Needs  . Financial resource strain: Not on file  . Food insecurity:    Worry: Not on file    Inability: Not on file  . Transportation needs:    Medical: Not on file    Non-medical: Not on file  Tobacco Use  . Smoking status: Former Smoker    Packs/day: 0.12    Years: 3.00    Pack years: 0.36    Types: Cigarettes    Last attempt to quit: 1990    Years since quitting: 29.5  . Smokeless tobacco: Never Used  Substance and Sexual Activity  . Alcohol use: Yes    Alcohol/week: 1.8 oz    Types: 3 Cans of beer per week  . Drug use: Yes    Types: Marijuana    Comment: 03/29/2017 "smoked a little weed when I was young"  . Sexual activity: Never    Birth control/protection: Surgical  Lifestyle  . Physical activity:    Days per week: Not on file    Minutes per session: Not on file  . Stress: Not on file  Relationships  . Social connections:    Talks on phone: Not on file    Gets together: Not on file    Attends religious service: Not on file    Active member of club or organization: Not on file    Attends meetings of clubs or organizations: Not on file    Relationship status: Not on file  Other Topics Concern  . Not on file  Social History Narrative  . Not on file     Family History:  The patient's ***family history includes Aneurysm in her mother; Cancer in her cousin; Heart disease in her father.   Review of Systems:   Please see the history of present illness.     General:  No chills, fever, night sweats or weight changes.    Cardiovascular:  No chest pain, dyspnea on exertion, edema, orthopnea, palpitations, paroxysmal nocturnal dyspnea. Dermatological: No rash, lesions/masses Respiratory: No cough, dyspnea Urologic: No hematuria, dysuria Abdominal:   No nausea, vomiting, diarrhea, bright red blood per rectum, melena, or hematemesis Neurologic:  No visual changes, wkns, changes in mental status. All other systems reviewed and are otherwise negative except as noted above.  Physical Exam:    VS:  There were no vitals taken for this visit.   General: Well developed, well nourished,female appearing in no acute distress. Head: Normocephalic, atraumatic, sclera non-icteric, no xanthomas, nares are without discharge.  Neck: No carotid bruits. JVD not elevated.  Lungs: Respirations regular and unlabored, without wheezes or rales.  Heart: ***Regular rate and rhythm. No S3 or S4.  No murmur, no rubs, or gallops appreciated. Abdomen: Soft, non-tender, non-distended with normoactive bowel sounds. No hepatomegaly. No rebound/guarding. No obvious abdominal masses. Msk:  Strength and tone appear normal for age. No joint deformities or effusions. Extremities: No clubbing or cyanosis. No edema.  Distal pedal pulses are 2+ bilaterally. Neuro: Alert and oriented X 3. Moves all extremities spontaneously. No focal deficits noted. Psych:  Responds to questions appropriately with a normal affect. Skin: No rashes or lesions noted  Wt Readings from Last 3 Encounters:  07/13/17 165 lb (74.8 kg)  04/13/17 166 lb 3.2 oz (75.4 kg)  03/30/17 168 lb 10.4 oz (76.5 kg)        Studies/Labs Reviewed:   EKG:  EKG is*** ordered today.  The ekg ordered today demonstrates ***  Recent Labs: 04/06/2017: BUN 26; Creatinine, Ser 1.11; Potassium 3.5; Sodium 135   Lipid Panel No results found for: CHOL, TRIG, HDL, CHOLHDL, VLDL, LDLCALC, LDLDIRECT  Additional studies/ records that were reviewed today include:   Cardiac Catheterization:  03/31/2017  Prox LAD lesion, 40 %stenosed.  Ost 1st Diag lesion, 95 %stenosed.  Mid RCA lesion, 99 %stenosed.   Post intervention, there is a 0% residual stenosis.  A stent was successfully placed.  The left ventricular systolic function is normal.  LV end diastolic pressure is normal.  The left ventricular ejection fraction is 55-65% by visual estimate.  IMPRESSION: Successful PCI and drug-eluting stenting of the subtotal mid dominant RCA stenosis with left-to-right collaterals using a 2.5 mm synergy drug-eluting stent. Patient did have a high grade ostial first diagonal branch stenosis which was a small vessel and can be treated medically. She had normal LV function. I placed her on IV nitroglycerin because of systemic hypertension. Angiomax will be continued a focus for 4 hours. The patient will be discharged home in the morning on 2 antiplatelet therapy for 1 year uninterrupted.  Assessment:    No diagnosis found.   Plan:   In order of problems listed above:  1. CAD  2. HTN  3. HLD - ***    Medication Adjustments/Labs and Tests Ordered: Current medicines are reviewed at length with the patient today.  Concerns regarding medicines are outlined above.  Medication changes, Labs and Tests ordered today are listed in the Patient Instructions below. There are no Patient Instructions on file for this visit.   Signed, Erma Heritage, PA-C  04/01/2018 8:22 PM    [facility] [address] [place], [place] [address] Phone: [phone]

## 2018-04-02 ENCOUNTER — Ambulatory Visit: Payer: BLUE CROSS/BLUE SHIELD | Admitting: Student

## 2018-04-03 ENCOUNTER — Telehealth: Payer: Self-pay | Admitting: Student

## 2018-06-14 ENCOUNTER — Encounter: Payer: Self-pay | Admitting: Internal Medicine

## 2019-03-03 ENCOUNTER — Telehealth: Payer: Self-pay | Admitting: Family

## 2019-05-21 ENCOUNTER — Other Ambulatory Visit: Payer: Self-pay

## 2019-05-21 DIAGNOSIS — U071 COVID-19: Secondary | ICD-10-CM

## 2019-05-23 LAB — NOVEL CORONAVIRUS, NAA: SARS-CoV-2, NAA: NOT DETECTED

## 2019-05-26 ENCOUNTER — Telehealth: Payer: Self-pay | Admitting: General Practice

## 2019-05-26 NOTE — Telephone Encounter (Signed)
Negative COVID results given. Patient results "NOT Detected." Caller expressed understanding. ° °

## 2019-05-30 ENCOUNTER — Telehealth (INDEPENDENT_AMBULATORY_CARE_PROVIDER_SITE_OTHER): Payer: Self-pay | Admitting: Cardiovascular Disease

## 2019-05-30 ENCOUNTER — Telehealth: Payer: Self-pay | Admitting: Cardiovascular Disease

## 2019-05-30 ENCOUNTER — Encounter: Payer: Self-pay | Admitting: Cardiovascular Disease

## 2019-05-30 VITALS — BP 151/86 | HR 77 | Ht 63.5 in | Wt 178.0 lb

## 2019-05-30 DIAGNOSIS — Z955 Presence of coronary angioplasty implant and graft: Secondary | ICD-10-CM

## 2019-05-30 DIAGNOSIS — R002 Palpitations: Secondary | ICD-10-CM

## 2019-05-30 DIAGNOSIS — I25118 Atherosclerotic heart disease of native coronary artery with other forms of angina pectoris: Secondary | ICD-10-CM

## 2019-05-30 DIAGNOSIS — E785 Hyperlipidemia, unspecified: Secondary | ICD-10-CM

## 2019-05-30 DIAGNOSIS — I10 Essential (primary) hypertension: Secondary | ICD-10-CM

## 2019-05-30 MED ORDER — NITROGLYCERIN 0.4 MG SL SUBL: 0.4000 mg | SUBLINGUAL_TABLET | SUBLINGUAL | 3 refills | Status: DC | PRN

## 2019-05-30 MED ORDER — CARVEDILOL 3.125 MG PO TABS: 3.1250 mg | ORAL_TABLET | Freq: Two times a day (BID) | ORAL | 0 refills | Status: DC

## 2019-05-30 NOTE — Progress Notes (Signed)
Virtual Visit via Telephone Note   This visit type was conducted due to national recommendations for restrictions regarding the COVID-19 Pandemic (e.g. social distancing) in an effort to limit this patient's exposure and mitigate transmission in our community.  Due to her co-morbid illnesses, this patient is at least at moderate risk for complications without adequate follow up.  This format is felt to be most appropriate for this patient at this time.  The patient did not have access to video technology/had technical difficulties with video requiring transitioning to audio format only (telephone).  All issues noted in this document were discussed and addressed.  No physical exam could be performed with this format.  Please refer to the patient's chart for her  consent to telehealth for [facility].   Date:  05/30/2019   ID:  ANALINA KINDEL, DOB Mar 09, 1956, MRN [id number]  Patient Location: Home Provider Location: Office  PCP:  Redmond School, MD  Cardiologist:  Kate Sable, MD  Electrophysiologist:  None   Evaluation Performed:  Follow-Up Visit  Chief Complaint:  CAD  History of Present Illness:    LISA FAUNTLEROY is a 63 y.o. female with history of coronary artery disease. She underwent RCA stent placement for a mid RCA 99% stenosis on 03/29/2017.  There is also stenosis of 95% in the ostium of the first diagonal branch.  LVEDP was normal and left ventricular systolic function was normal.  I last evaluated her in November 2018.  She retired and was without insurance.  She has been experiencing palpitations for the past 6 months. They mostly occur at night. She seldom has chest pain and shortness of breath.  She had been on atenolol but this was switched by her PCP to hydralazine.  She said her blood pressure continues to stay elevated.  The patient does not have symptoms concerning for COVID-19 infection (fever, chills, cough).    Past Medical History:  Diagnosis  Date  . Acute diverticulitis 03/28/2013   Seen on CT - treated with flagyl an cipro  . Arthritis    "shoulders, knees, hips" (03/29/2017)  . CAD (coronary artery disease), native coronary artery    03/29/17 PCI/DES to the mRCA, EF normal   . Essential hypertension   . GERD (gastroesophageal reflux disease)   . Headache    "bad ones lately; maybe q other day" (03/29/2017)  . High cholesterol    Past Surgical History:  Procedure Laterality Date  . ABDOMINAL HYSTERECTOMY    . COLONOSCOPY  11/08/2007   [id number], rectosigmoid junction/hyperplastic-appearing polypoid    mucosa, 1 of these areas biopsied.  Otherwise, normal rectum/Left-sided transverse diverticula/Diminutive polyp just distal to the ileocecal valve. Hyperplastic and adenomatous polyp.   . COLONOSCOPY N/A 06/26/2013   [id number] diverticulosis. Colonic polyps removed  . CORONARY ANGIOPLASTY WITH STENT PLACEMENT  03/29/2017  . CORONARY STENT INTERVENTION N/A 03/29/2017   Procedure: Coronary Stent Intervention;  Surgeon: Lorretta Harp, MD;  Location: [facility];  Service: Cardiovascular;  Laterality: N/A;  . DILATION AND CURETTAGE OF UTERUS    . ESOPHAGOGASTRODUODENOSCOPY  2008   RMR: normal  . LEFT HEART CATH AND CORONARY ANGIOGRAPHY N/A 03/29/2017   Procedure: Left Heart Cath and Coronary Angiography;  Surgeon: Lorretta Harp, MD;  Location: [facility];  Service: Cardiovascular;  Laterality: N/A;  . TUBAL LIGATION       Current Meds  Medication Sig  . amitriptyline (ELAVIL) 10 MG tablet Take 10 mg by mouth at bedtime.  Marland Kitchen  aspirin 81 MG chewable tablet Chew 1 tablet (81 mg total) by mouth daily.  Marland Kitchen b complex vitamins tablet Take 1 tablet by mouth daily.  . clopidogrel (PLAVIX) 75 MG tablet Take 1 tablet (75 mg total) by mouth daily with breakfast.  . diclofenac sodium (VOLTAREN) 1 % GEL Apply topically 4 (four) times daily.  Marland Kitchen estradiol (ESTRACE) 2 MG tablet TAKE ONE TABLET BY MOUTH EVERY DAY   . ferrous sulfate 325 (65 FE) MG EC tablet Take 325 mg by mouth daily with breakfast.  . hydrALAZINE (APRESOLINE) 25 MG tablet Take 25 mg by mouth 3 (three) times daily.  . hydrochlorothiazide (HYDRODIURIL) 25 MG tablet Take 25 mg by mouth daily.  . hydrOXYzine (ATARAX/VISTARIL) 10 MG tablet Take 10 mg by mouth 3 (three) times daily as needed.  . montelukast (SINGULAIR) 10 MG tablet Take 10 mg by mouth at bedtime.  . Multiple Vitamin (MULTIVITAMIN WITH MINERALS) TABS tablet Take 1 tablet by mouth daily.  . Multiple Vitamins-Minerals (HAIR/SKIN/NAILS PO) Take 1 tablet by mouth daily.  . nitroGLYCERIN (NITROSTAT) 0.4 MG SL tablet Place 1 tablet (0.4 mg total) under the tongue every 5 (five) minutes x 3 doses as needed.  . pantoprazole (PROTONIX) 20 MG tablet Take 20 mg by mouth daily.  . polyethylene glycol-electrolytes (TRILYTE) 420 G solution Take 4,000 mLs by mouth as directed.  . potassium chloride (KLOR-CON) 20 MEQ packet Take 20 mEq by mouth daily.  . rosuvastatin (CRESTOR) 40 MG tablet Take 1 tablet (40 mg total) by mouth daily.  . sertraline (ZOLOFT) 25 MG tablet Take 25 mg by mouth daily as needed (Hot Flashes).  . traMADol (ULTRAM) 50 MG tablet Take 50 mg by mouth as needed for pain.  Marland Kitchen UNABLE TO FIND ASA 500MG  AND CAFFEINE  32.5MG  AS NEEDED  . Zinc Sulfate (ZINC 15 PO) Take by mouth.  . [DISCONTINUED] atenolol-chlorthalidone (TENORETIC) 50-25 MG per tablet Take 1 tablet by mouth daily.  . [DISCONTINUED] diltiazem (CARTIA XT) 180 MG 24 hr capsule Take 1 capsule (180 mg total) by mouth daily.  . [DISCONTINUED] nitroGLYCERIN (NITROSTAT) 0.4 MG SL tablet Place 1 tablet under the tongue every 5 (five) minutes x 3 doses as needed.  . [DISCONTINUED] pantoprazole (PROTONIX) 20 MG tablet Take 1 tablet (20 mg total) by mouth daily.     Allergies:   Penicillins   Social History   Tobacco Use  . Smoking status: Former Smoker    Packs/day: 0.12    Years: 3.00    Pack years: 0.36    Types:  Cigarettes    Quit date: 1990    Years since quitting: 30.7  . Smokeless tobacco: Never Used  Substance Use Topics  . Alcohol use: Yes    Alcohol/week: 3.0 standard drinks    Types: 3 Cans of beer per week  . Drug use: Yes    Types: Marijuana    Comment: 03/29/2017 "smoked a little weed when I was young"     Family Hx: The patient's family history includes Aneurysm in her mother; Cancer in her cousin; Heart disease in her father. There is no history of Colon cancer.  ROS:   Please see the history of present illness.     All other systems reviewed and are negative.   Prior CV studies:   The following studies were reviewed today:  Cardiac catheterization 03/29/2017:   Prox LAD lesion, 40 %stenosed.  Ost 1st Diag lesion, 95 %stenosed.  Mid RCA lesion, 99 %stenosed.  Post intervention,  there is a 0% residual stenosis.  A stent was successfully placed.  The left ventricular systolic function is normal.  LV end diastolic pressure is normal.  The left ventricular ejection fraction is 55-65% by visual estimate.  Labs/Other Tests and Data Reviewed:    EKG:  No ECG reviewed.  Recent Labs: No results found for requested labs within last 8760 hours.   Recent Lipid Panel No results found for: CHOL, TRIG, HDL, CHOLHDL, LDLCALC, LDLDIRECT  Wt Readings from Last 3 Encounters:  05/30/19 178 lb (80.7 kg)  07/13/17 165 lb (74.8 kg)  04/13/17 166 lb 3.2 oz (75.4 kg)     Objective:    Vital Signs:  BP (!) 151/86   Pulse 77   Ht 5' 3.5" (1.613 m)   Wt 178 lb (80.7 kg)   BMI 31.04 kg/m    VITAL SIGNS:  reviewed  ASSESSMENT & PLAN:    1.  Coronary disease status post RCA stent placement: Symptomatically stable.  Continue aspirin and Crestor. I will stop Plavix.  I will start carvedilol 3.125 mg bid given hypertension and palpitations.  2.  Hypertension: This is mildly elevated.  I will start carvedilol 3.125 mg bid.  3.  Hyperlipidemia: Continue Crestor. LDL 77 in  July 2019. I will obtain a copy of lipids from PCP.  4. Palpitations: I will order a 2-D echocardiogram with Doppler to evaluate cardiac structure, function, and regional wall motion. I will obtain a one week event monitor. I will obtain a copy of labs from PCP. I will start carvedilol 3.125 mg bid after she has worn the event monitor.    COVID-19 Education: The signs and symptoms of COVID-19 were discussed with the patient and how to seek care for testing (follow up with PCP or arrange E-visit).  The importance of social distancing was discussed today.  Time:   Today, I have spent 25 minutes with the patient with telehealth technology discussing the above problems.     Medication Adjustments/Labs and Tests Ordered: Current medicines are reviewed at length with the patient today.  Concerns regarding medicines are outlined above.   Tests Ordered: No orders of the defined types were placed in this encounter.   Medication Changes: Meds ordered this encounter  Medications  . nitroGLYCERIN (NITROSTAT) 0.4 MG SL tablet    Sig: Place 1 tablet (0.4 mg total) under the tongue every 5 (five) minutes x 3 doses as needed.    Dispense:  25 tablet    Refill:  3    Follow Up:  In Person in 3 month(s)  Signed, Kate Sable, MD  05/30/2019 9:54 AM    [place]

## 2019-05-30 NOTE — Telephone Encounter (Signed)
°  Precert needed for: Monitor for one month

## 2019-05-30 NOTE — Addendum Note (Signed)
Addended by: Julian Hy T on: 05/30/2019 10:55 AM   Modules accepted: Orders

## 2019-05-30 NOTE — Telephone Encounter (Signed)
°  Precert needed for: Echo  Location: [facility]    Date: Oct 28,2020

## 2019-05-30 NOTE — Patient Instructions (Signed)
Your physician recommends that you schedule a follow-up appointment in: [place] [facility] [place] physician has recommended you make the following change in your medication:   STOP PLAVIX   START CARVEDILOL 3.125 MG TWICE DAILY AFTER [place]   Your physician has requested that you have an echocardiogram. Echocardiography is a painless test that uses sound waves to create images of your heart. It provides your doctor with information about the size and shape of your heart and how well your heart's chambers and valves are working. This procedure takes approximately one hour. There are no restrictions for this procedure.  Your physician has recommended that you wear an event monitor FOR 1 WEEK THIS WILL BE MAILED TO YOUR HOME. Event monitors are medical devices that record the heart's electrical activity. Doctors most often [place] these monitors to diagnose arrhythmias. Arrhythmias are problems with the speed or rhythm of the heartbeat. The monitor is a small, portable device. You can wear one while you do your normal daily activities. This is usually used to diagnose what is causing palpitations/syncope (passing out).  Thank you for choosing [facility]!!

## 2019-06-03 ENCOUNTER — Telehealth: Payer: Self-pay | Admitting: Cardiovascular Disease

## 2019-06-03 NOTE — Telephone Encounter (Signed)
Spoke with [employer] and pt is scheduled to receive monitor tomorrow - pt made aware

## 2019-06-03 NOTE — Telephone Encounter (Signed)
Calling in reference to monitor   She has not heard anything from company

## 2019-06-06 ENCOUNTER — Ambulatory Visit (INDEPENDENT_AMBULATORY_CARE_PROVIDER_SITE_OTHER): Payer: Self-pay

## 2019-06-06 DIAGNOSIS — R002 Palpitations: Secondary | ICD-10-CM

## 2019-06-11 ENCOUNTER — Other Ambulatory Visit: Payer: Self-pay | Admitting: Cardiovascular Disease

## 2019-06-11 DIAGNOSIS — I25118 Atherosclerotic heart disease of native coronary artery with other forms of angina pectoris: Secondary | ICD-10-CM

## 2019-06-16 ENCOUNTER — Telehealth: Payer: Self-pay | Admitting: Cardiovascular Disease

## 2019-06-16 ENCOUNTER — Other Ambulatory Visit: Payer: Self-pay | Admitting: Cardiovascular Disease

## 2019-06-16 ENCOUNTER — Other Ambulatory Visit: Payer: Self-pay | Admitting: *Deleted

## 2019-06-16 MED ORDER — NITROGLYCERIN 0.4 MG SL SUBL: 0.4000 mg | SUBLINGUAL_TABLET | SUBLINGUAL | 3 refills | Status: DC | PRN

## 2019-06-16 NOTE — Telephone Encounter (Signed)
Patient called stating that she needs to have nitroGLYCERIN (NITROSTAT) 0.4 MG SL tablet called to [facility] [place] Patient also states that Dr. Bronson Ing was going to call her in another medication but she is uncertain as to what the medication was. States that she has not received this medication.

## 2019-06-16 NOTE — Telephone Encounter (Signed)
Patient called to say she has things worked out and they will be sending medications

## 2019-06-16 NOTE — Telephone Encounter (Signed)
Informed patient that both her Nitroglycerin & Coreg was sent to [facility] [place], [place] on 05/30/2019. Stated that they do not have the NTG and she will have to get from her local pharm - refill sent to [facility] now.  She will call them back in regards to the Coreg as we have receipts confirmed by pharmacy (05/30/2019 10:41 AM EDT)

## 2019-06-16 NOTE — Telephone Encounter (Signed)
Great, noted

## 2019-06-19 ENCOUNTER — Telehealth: Payer: Self-pay | Admitting: *Deleted

## 2019-06-19 NOTE — Telephone Encounter (Signed)
Notes recorded by Laurine Blazer, LPN on [id number] at 2:10 PM EDT  Patient notified. Copy to pmd. Follow up scheduled for December.  ------   Notes recorded by Herminio Commons, MD on 06/19/2019 at 9:04 AM EDT    Sinus rhythm. No arrhythmias.   Symptoms correlated with sinus rhythm.

## 2019-06-19 NOTE — Telephone Encounter (Signed)
-----   Message from Herminio Commons, MD sent at 06/19/2019  9:04 AM EDT -----     Sinus rhythm. No arrhythmias.  Symptoms correlated with sinus rhythm.

## 2019-06-24 ENCOUNTER — Telehealth: Payer: Self-pay | Admitting: Cardiovascular Disease

## 2019-07-02 ENCOUNTER — Other Ambulatory Visit: Payer: Self-pay

## 2019-07-02 ENCOUNTER — Ambulatory Visit (INDEPENDENT_AMBULATORY_CARE_PROVIDER_SITE_OTHER): Payer: Self-pay

## 2019-07-02 DIAGNOSIS — I25118 Atherosclerotic heart disease of native coronary artery with other forms of angina pectoris: Secondary | ICD-10-CM

## 2019-07-03 ENCOUNTER — Telehealth: Payer: Self-pay | Admitting: *Deleted

## 2019-07-03 NOTE — Telephone Encounter (Signed)
Notes recorded by Laurine Blazer, LPN on [id number] at 9:22 AM EDT  Patient notified. Copy to pmd. Follow up scheduled for 08/26/2019.    ------   Notes recorded by Herminio Commons, MD on 07/02/2019 at 3:16 PM EDT  Normal pumping function.

## 2019-08-12 ENCOUNTER — Other Ambulatory Visit: Payer: Self-pay

## 2019-08-12 DIAGNOSIS — Z20822 Contact with and (suspected) exposure to covid-19: Secondary | ICD-10-CM

## 2019-08-14 LAB — NOVEL CORONAVIRUS, NAA: SARS-CoV-2, NAA: NOT DETECTED

## 2019-08-26 ENCOUNTER — Ambulatory Visit (INDEPENDENT_AMBULATORY_CARE_PROVIDER_SITE_OTHER): Payer: Self-pay | Admitting: Cardiovascular Disease

## 2019-08-26 ENCOUNTER — Encounter: Payer: Self-pay | Admitting: Cardiovascular Disease

## 2019-08-26 VITALS — Ht 63.0 in | Wt 186.0 lb

## 2019-08-26 DIAGNOSIS — Z955 Presence of coronary angioplasty implant and graft: Secondary | ICD-10-CM

## 2019-08-26 DIAGNOSIS — I10 Essential (primary) hypertension: Secondary | ICD-10-CM

## 2019-08-26 DIAGNOSIS — I25118 Atherosclerotic heart disease of native coronary artery with other forms of angina pectoris: Secondary | ICD-10-CM

## 2019-08-26 DIAGNOSIS — E785 Hyperlipidemia, unspecified: Secondary | ICD-10-CM

## 2019-08-26 DIAGNOSIS — R002 Palpitations: Secondary | ICD-10-CM

## 2019-08-26 MED ORDER — CARVEDILOL 6.25 MG PO TABS: 6.2500 mg | ORAL_TABLET | Freq: Two times a day (BID) | ORAL | 1 refills | Status: DC

## 2019-08-26 NOTE — Progress Notes (Signed)
Virtual Visit via Telephone Note   This visit type was conducted due to national recommendations for restrictions regarding the COVID-19 Pandemic (e.g. social distancing) in an effort to limit this patient's exposure and mitigate transmission in our community.  Due to her co-morbid illnesses, this patient is at least at moderate risk for complications without adequate follow up.  This format is felt to be most appropriate for this patient at this time.  The patient did not have access to video technology/had technical difficulties with video requiring transitioning to audio format only (telephone).  All issues noted in this document were discussed and addressed.  No physical exam could be performed with this format.  Please refer to the patient's chart for her  consent to telehealth for [facility].   Date:  08/26/2019   ID:  Diane Parker, DOB [birth year], MRN [id number]  Patient Location: Home Provider Location: Office  PCP:  Redmond School, MD  Cardiologist:  Kate Sable, MD  Electrophysiologist:  None   Evaluation Performed:  Follow-Up Visit  Chief Complaint:  CAD  History of Present Illness:    Diane Parker is a 63 y.o. female with history of coronary artery disease. She underwent RCA stent placement for a mid RCA 99% stenosis on 03/29/2017. There is also stenosis of 95% in the ostium of the first diagonal branch. LVEDP was normal and left ventricular systolic function was normal.  At her last visit she was complaining of palpitations.  Event monitoring demonstrated sinus rhythm without arrhythmias.  Echocardiogram demonstrated normal LV systolic function.  She's been exercising and losing weight. She denies chest pain. She occasionally has palpitations.   Past Medical History:  Diagnosis Date  . Acute diverticulitis 03/28/2013   Seen on CT - treated with flagyl an cipro  . Arthritis    "shoulders, knees, hips" (03/29/2017)  . CAD (coronary artery disease),  native coronary artery    03/29/17 PCI/DES to the mRCA, EF normal   . Essential hypertension   . GERD (gastroesophageal reflux disease)   . Headache    "bad ones lately; maybe q other day" (03/29/2017)  . High cholesterol    Past Surgical History:  Procedure Laterality Date  . ABDOMINAL HYSTERECTOMY    . COLONOSCOPY  11/08/2007   [id number], rectosigmoid junction/hyperplastic-appearing polypoid    mucosa, 1 of these areas biopsied.  Otherwise, normal rectum/Left-sided transverse diverticula/Diminutive polyp just distal to the ileocecal valve. Hyperplastic and adenomatous polyp.   . COLONOSCOPY N/A 06/26/2013   [id number] diverticulosis. Colonic polyps removed  . CORONARY ANGIOPLASTY WITH STENT PLACEMENT  03/29/2017  . CORONARY STENT INTERVENTION N/A 03/29/2017   Procedure: Coronary Stent Intervention;  Surgeon: Lorretta Harp, MD;  Location: [place] [facility];  Service: Cardiovascular;  Laterality: N/A;  . DILATION AND CURETTAGE OF UTERUS    . ESOPHAGOGASTRODUODENOSCOPY  2008   RMR: normal  . LEFT HEART CATH AND CORONARY ANGIOGRAPHY N/A 03/29/2017   Procedure: Left Heart Cath and Coronary Angiography;  Surgeon: Lorretta Harp, MD;  Location: [place] [facility];  Service: Cardiovascular;  Laterality: N/A;  . TUBAL LIGATION       Current Meds  Medication Sig  . amitriptyline (ELAVIL) 10 MG tablet Take 10 mg by mouth at bedtime.  Marland Kitchen aspirin 81 MG chewable tablet Chew 1 tablet (81 mg total) by mouth daily.  Marland Kitchen b complex vitamins tablet Take 1 tablet by mouth daily.  . carvedilol (COREG) 3.125 MG tablet Take 1 tablet (3.125 mg total) by  mouth 2 (two) times daily.  . diclofenac sodium (VOLTAREN) 1 % GEL Apply topically 4 (four) times daily.  Marland Kitchen estradiol (ESTRACE) 2 MG tablet TAKE ONE TABLET BY MOUTH EVERY DAY  . ferrous sulfate 325 (65 FE) MG EC tablet Take 325 mg by mouth daily with breakfast.  . hydrALAZINE (APRESOLINE) 25 MG tablet Take 25 mg by mouth 3 (three) times daily.    . hydrochlorothiazide (HYDRODIURIL) 25 MG tablet Take 25 mg by mouth daily.  . hydrOXYzine (ATARAX/VISTARIL) 10 MG tablet Take 10 mg by mouth 3 (three) times daily as needed.  . montelukast (SINGULAIR) 10 MG tablet Take 10 mg by mouth at bedtime.  . Multiple Vitamin (MULTIVITAMIN WITH MINERALS) TABS tablet Take 1 tablet by mouth daily.  . Multiple Vitamins-Minerals (HAIR/SKIN/NAILS PO) Take 1 tablet by mouth daily.  . nitroGLYCERIN (NITROSTAT) 0.4 MG SL tablet Place 1 tablet (0.4 mg total) under the tongue every 5 (five) minutes x 3 doses as needed.  . pantoprazole (PROTONIX) 20 MG tablet Take 20 mg by mouth daily.  . polyethylene glycol-electrolytes (TRILYTE) 420 G solution Take 4,000 mLs by mouth as directed.  . potassium chloride (KLOR-CON) 20 MEQ packet Take 20 mEq by mouth daily.  . rosuvastatin (CRESTOR) 40 MG tablet Take 1 tablet (40 mg total) by mouth daily.  . sertraline (ZOLOFT) 25 MG tablet Take 25 mg by mouth daily as needed (Hot Flashes).  . traMADol (ULTRAM) 50 MG tablet Take 50 mg by mouth as needed for pain.  Marland Kitchen UNABLE TO FIND ASA 500MG  AND CAFFEINE  32.5MG  AS NEEDED  . Zinc Sulfate (ZINC 15 PO) Take by mouth.     Allergies:   Penicillins   Social History   Tobacco Use  . Smoking status: Former Smoker    Packs/day: 0.12    Years: 3.00    Pack years: 0.36    Types: Cigarettes    Quit date: 1990    Years since quitting: 30.9  . Smokeless tobacco: Never Used  Substance Use Topics  . Alcohol use: Yes    Alcohol/week: 3.0 standard drinks    Types: 3 Cans of beer per week  . Drug use: Yes    Types: Marijuana    Comment: 03/29/2017 "smoked a little weed when I was young"     Family Hx: The patient's family history includes Aneurysm in her mother; Cancer in her cousin; Heart disease in her father. There is no history of Colon cancer.  ROS:   Please see the history of present illness.     All other systems reviewed and are negative.   Prior CV studies:   The  following studies were reviewed today:  Cardiac catheterization 03/29/2017:   Prox LAD lesion, 40 %stenosed.  Ost 1st Diag lesion, 95 %stenosed.  Mid RCA lesion, 99 %stenosed.  Post intervention, there is a 0% residual stenosis.  A stent was successfully placed.  The left ventricular systolic function is normal.  LV end diastolic pressure is normal.  The left ventricular ejection fraction is 55-65% by visual estimate.   Echocardiogram 07/02/2019:   1. Left ventricular ejection fraction, by visual estimation, is 60 to 65%. The left ventricle has normal function. Normal left ventricular size. There is borderline left ventricular hypertrophy.  2. Left ventricular diastolic Doppler parameters are consistent with impaired relaxation pattern of LV diastolic filling.  3. Global right ventricle has normal systolic function.The right ventricular size is normal. No increase in right ventricular wall thickness.  4. Left atrial  size was normal.  5. Right atrial size was normal.  6. The mitral valve is grossly normal. Mild mitral valve regurgitation.  7. The tricuspid valve is grossly normal. Tricuspid valve regurgitation is trivial.  8. The aortic valve is tricuspid Aortic valve regurgitation is trivial by color flow Doppler. Mild aortic valve sclerosis without stenosis.  9. The pulmonic valve was grossly normal. Pulmonic valve regurgitation is not visualized by color flow Doppler. 10. TR signal is inadequate for assessing pulmonary artery systolic pressure. 11. The inferior vena cava is normal in size with greater than 50% respiratory variability, suggesting right atrial pressure of 3 mmHg.  Labs/Other Tests and Data Reviewed:    EKG:  No ECG reviewed.  Recent Labs: No results found for requested labs within last 8760 hours.   Recent Lipid Panel No results found for: CHOL, TRIG, HDL, CHOLHDL, LDLCALC, LDLDIRECT  Wt Readings from Last 3 Encounters:  08/26/19 186 lb (84.4 kg)    05/30/19 178 lb (80.7 kg)  07/13/17 165 lb (74.8 kg)     Objective:    Vital Signs:  Ht 5\' 3"  (1.6 m)   Wt 186 lb (84.4 kg)   BMI 32.95 kg/m    VITAL SIGNS:  reviewed  ASSESSMENT & PLAN:    1. Coronary disease status post RCA stent placement: Symptomatically stable. Continue aspirin, carvedilol, and Crestor.   2. Hypertension: Currently on carvedilol 3.125 mg bid. BP 161/91 earlier this morning. I will increase to 6.25 mg bid.  3. Hyperlipidemia: Continue Crestor. LDL 77 in July 2019. I will obtain a copy of lipids from PCP.  4. Palpitations:  Event monitoring demonstrated sinus rhythm with no arrhythmias.  Left ventricular systolic function is normal.  BP 161/91 earlier this morning. I will increase to 6.25 mg bid.     COVID-19 Education: The signs and symptoms of COVID-19 were discussed with the patient and how to seek care for testing (follow up with PCP or arrange E-visit).  The importance of social distancing was discussed today.  Time:   Today, I have spent 15 minutes with the patient with telehealth technology discussing the above problems.     Medication Adjustments/Labs and Tests Ordered: Current medicines are reviewed at length with the patient today.  Concerns regarding medicines are outlined above.   Tests Ordered: No orders of the defined types were placed in this encounter.   Medication Changes: No orders of the defined types were placed in this encounter.   Follow Up:  Virtual Visit  in 6 month(s)  Signed, Kate Sable, MD  08/26/2019 2:46 PM    [place]

## 2019-08-26 NOTE — Addendum Note (Signed)
Addended by: Barbarann Ehlers A on: 08/26/2019 03:02 PM   Modules accepted: Orders

## 2019-08-26 NOTE — Patient Instructions (Signed)
Medication Instructions:  Your physician recommends that you continue on your current medications as directed. Please refer to the Current Medication list given to you today.  *If you need a refill on your cardiac medications before your next appointment, please call your pharmacy*  Lab Work: None today If you have labs (blood work) drawn today and your tests are completely normal, you will receive your results only by: . MyChart Message (if you have MyChart) OR . A paper copy in the mail If you have any lab test that is abnormal or we need to change your treatment, we will call you to review the results.  Testing/Procedures: None today  Follow-Up: At [facility], you and your health needs are our priority.  As part of our continuing mission to provide you with exceptional heart care, we have created designated Provider Care Teams.  These Care Teams include your primary Cardiologist (physician) and Advanced Practice Providers (APPs -  Physician Assistants and Nurse Practitioners) who all work together to provide you with the care you need, when you need it.  Your next appointment:   6 month(s)  The format for your next appointment:   Virtual Visit   Provider:   Suresh Koneswaran, MD  Other Instructions None     Thank you for choosing [facility] !         

## 2019-11-06 ENCOUNTER — Ambulatory Visit: Payer: Self-pay

## 2019-11-07 ENCOUNTER — Ambulatory Visit: Payer: Self-pay | Attending: Internal Medicine

## 2019-11-07 DIAGNOSIS — Z23 Encounter for immunization: Secondary | ICD-10-CM | POA: Insufficient documentation

## 2019-11-07 NOTE — Progress Notes (Signed)
   Covid-19 Vaccination Clinic  Name:  Diane Parker    MRN: [id number] DOB: June 04, 1956  11/07/2019  [name] was observed post Covid-19 immunization for 30 minutes based on pre-vaccination screening without incident. She was provided with Vaccine Information Sheet and instruction to access the V-Safe system.   [name] was instructed to call 911 with any severe reactions post vaccine: Marland Kitchen Difficulty breathing  . Swelling of face and throat  . A fast heartbeat  . A bad rash all over body  . Dizziness and weakness   Immunizations Administered    Name Date Dose VIS Date Route   Moderna COVID-19 Vaccine 11/07/2019  8:22 AM 0.5 mL 08/05/2019 Intramuscular   Manufacturer: Moderna   Lot: [id number]   [id number]

## 2019-12-09 ENCOUNTER — Ambulatory Visit: Payer: Self-pay | Attending: Internal Medicine

## 2019-12-09 DIAGNOSIS — Z23 Encounter for immunization: Secondary | ICD-10-CM

## 2019-12-09 NOTE — Progress Notes (Signed)
   Covid-19 Vaccination Clinic  Name:  Diane Parker    MRN: [id number] DOB: [birth year]  12/09/2019  [name] was observed post Covid-19 immunization for 15 minutes without incident. She was provided with Vaccine Information Sheet and instruction to access the V-Safe system.   [name] was instructed to call 911 with any severe reactions post vaccine: Marland Kitchen Difficulty breathing  . Swelling of face and throat  . A fast heartbeat  . A bad rash all over body  . Dizziness and weakness   Immunizations Administered    Name Date Dose VIS Date Route   Moderna COVID-19 Vaccine 12/09/2019  8:43 AM 0.5 mL 08/05/2019 Intramuscular   Manufacturer: Moderna   [id number]   [id number]

## 2020-03-31 ENCOUNTER — Other Ambulatory Visit (HOSPITAL_COMMUNITY): Payer: Self-pay | Admitting: Internal Medicine

## 2020-03-31 DIAGNOSIS — Z1231 Encounter for screening mammogram for malignant neoplasm of breast: Secondary | ICD-10-CM

## 2020-04-07 ENCOUNTER — Ambulatory Visit (HOSPITAL_COMMUNITY): Payer: Self-pay

## 2020-04-21 ENCOUNTER — Ambulatory Visit (INDEPENDENT_AMBULATORY_CARE_PROVIDER_SITE_OTHER): Payer: 59 | Admitting: Adult Health

## 2020-04-21 ENCOUNTER — Other Ambulatory Visit: Payer: Self-pay

## 2020-04-21 ENCOUNTER — Encounter: Payer: Self-pay | Admitting: Adult Health

## 2020-04-21 VITALS — BP 150/79 | HR 58 | Ht 63.0 in | Wt 180.0 lb

## 2020-04-21 DIAGNOSIS — Z01419 Encounter for gynecological examination (general) (routine) without abnormal findings: Secondary | ICD-10-CM | POA: Insufficient documentation

## 2020-04-21 DIAGNOSIS — Z90710 Acquired absence of both cervix and uterus: Secondary | ICD-10-CM | POA: Insufficient documentation

## 2020-04-21 DIAGNOSIS — Z1211 Encounter for screening for malignant neoplasm of colon: Secondary | ICD-10-CM

## 2020-04-21 LAB — HEMOCCULT GUIAC POC 1CARD (OFFICE): Fecal Occult Blood, POC: NEGATIVE

## 2020-04-21 NOTE — Addendum Note (Signed)
Addended by: Derrek Monaco A on: 04/21/2020 11:04 AM   Modules accepted: Orders

## 2020-04-21 NOTE — Progress Notes (Signed)
Patient ID: Diane Parker, female   DOB: [birth year], 64 y.o.   MRN: [id number] History of Present Illness: Diane Parker is a 64 year old black female, engaged, sp hysterectomy in for well woman gyn exam. She is working third shift at [employer].  PCP is Dr Diane Parker.   Current Medications, Allergies, Past Medical History, Past Surgical History, Family History and Social History were reviewed in [facility] record.     Review of Systems: Patient denies any headaches, hearing loss, fatigue, blurred vision, shortness of breath, chest pain, abdominal pain, problems with bowel movements, urination, or intercourse. No joint pain or mood swings. Has pain in neck, sees Dr Diane Parker Not sleeping well   Physical Exam:BP (!) 150/79 (BP Location: Right Arm, Patient Position: Sitting, Cuff Size: Normal)   Pulse (!) 58   Ht 5\' 3"  (1.6 m)   Wt 180 lb (81.6 kg)   BMI 31.89 kg/m  General:  Well developed, well nourished, no acute distress Skin:  Warm and dry Neck:  Midline trachea, normal thyroid, good ROM, no lymphadenopathy,no carotid bruits heard  Lungs; Clear to auscultation bilaterally Breast:  No dominant palpable mass, retraction, or nipple discharge Cardiovascular: Regular rate and rhythm Abdomen:  Soft, non tender, no hepatosplenomegaly Pelvic:  External genitalia is normal in appearance, no lesions.  The vagina is normal in appearance, has good moisture. Urethra has no lesions or masses. The cervix and uterus are absent.  No adnexal masses or tenderness noted.Bladder is non tender, no masses felt. Rectal: Good sphincter tone, no polyps, + hemorrhoids felt.  Hemoccult negative. Extremities/musculoskeletal:  No swelling or varicosities noted, no clubbing or cyanosis Psych:  No mood changes, alert and cooperative,seems happy AA is 7, she says she drinks beer, but no problem Fall risk is low PHQ 9 score is 6, no SI, not depressed, just not sleeping well  [place] - 04/21/20 1034       Pregnancy Intention Screening   Does the patient want to become pregnant in the next year? No    Does the patient's partner want to become pregnant in the next year? No    Would the patient like to discuss contraceptive options today? No      Contraception Wrap Up   Current Method --   hysterectomy   End Method --   hysterectomy   Contraception Counseling Provided No         Examination chaperoned by Diona Fanti CMA.   Impression and Plan: 1. Encounter for well woman exam with routine gynecological exam Physical in 1 year Labs with PCP Get mammogram  Colonoscopy per GI  Follow up with Dr Diane Parker about neck   2. S/P hysterectomy  3. Encounter for screening fecal occult blood testing

## 2020-04-26 ENCOUNTER — Other Ambulatory Visit: Payer: Self-pay

## 2020-04-26 ENCOUNTER — Other Ambulatory Visit (HOSPITAL_COMMUNITY): Payer: Self-pay | Admitting: Internal Medicine

## 2020-04-26 ENCOUNTER — Ambulatory Visit (HOSPITAL_COMMUNITY)
Admission: RE | Admit: 2020-04-26 | Discharge: 2020-04-26 | Disposition: A | Discharge status: Home / Self Care | Payer: 59 | Source: Ambulatory Visit | Attending: Internal Medicine | Admitting: Internal Medicine

## 2020-04-26 DIAGNOSIS — M79605 Pain in left leg: Secondary | ICD-10-CM | POA: Insufficient documentation

## 2020-04-26 IMAGING — DX DG HIP (WITH OR WITHOUT PELVIS) 2-3V*L*
3 series · 3 of 3 positions shown · non-contrast
Comparison: None.

CLINICAL DATA: Chronic lower back and left hip pain with worsening
since [REDACTED]. No injury.

EXAM:
DG HIP (WITH OR WITHOUT PELVIS) 2-3V LEFT

[pelvis ap]
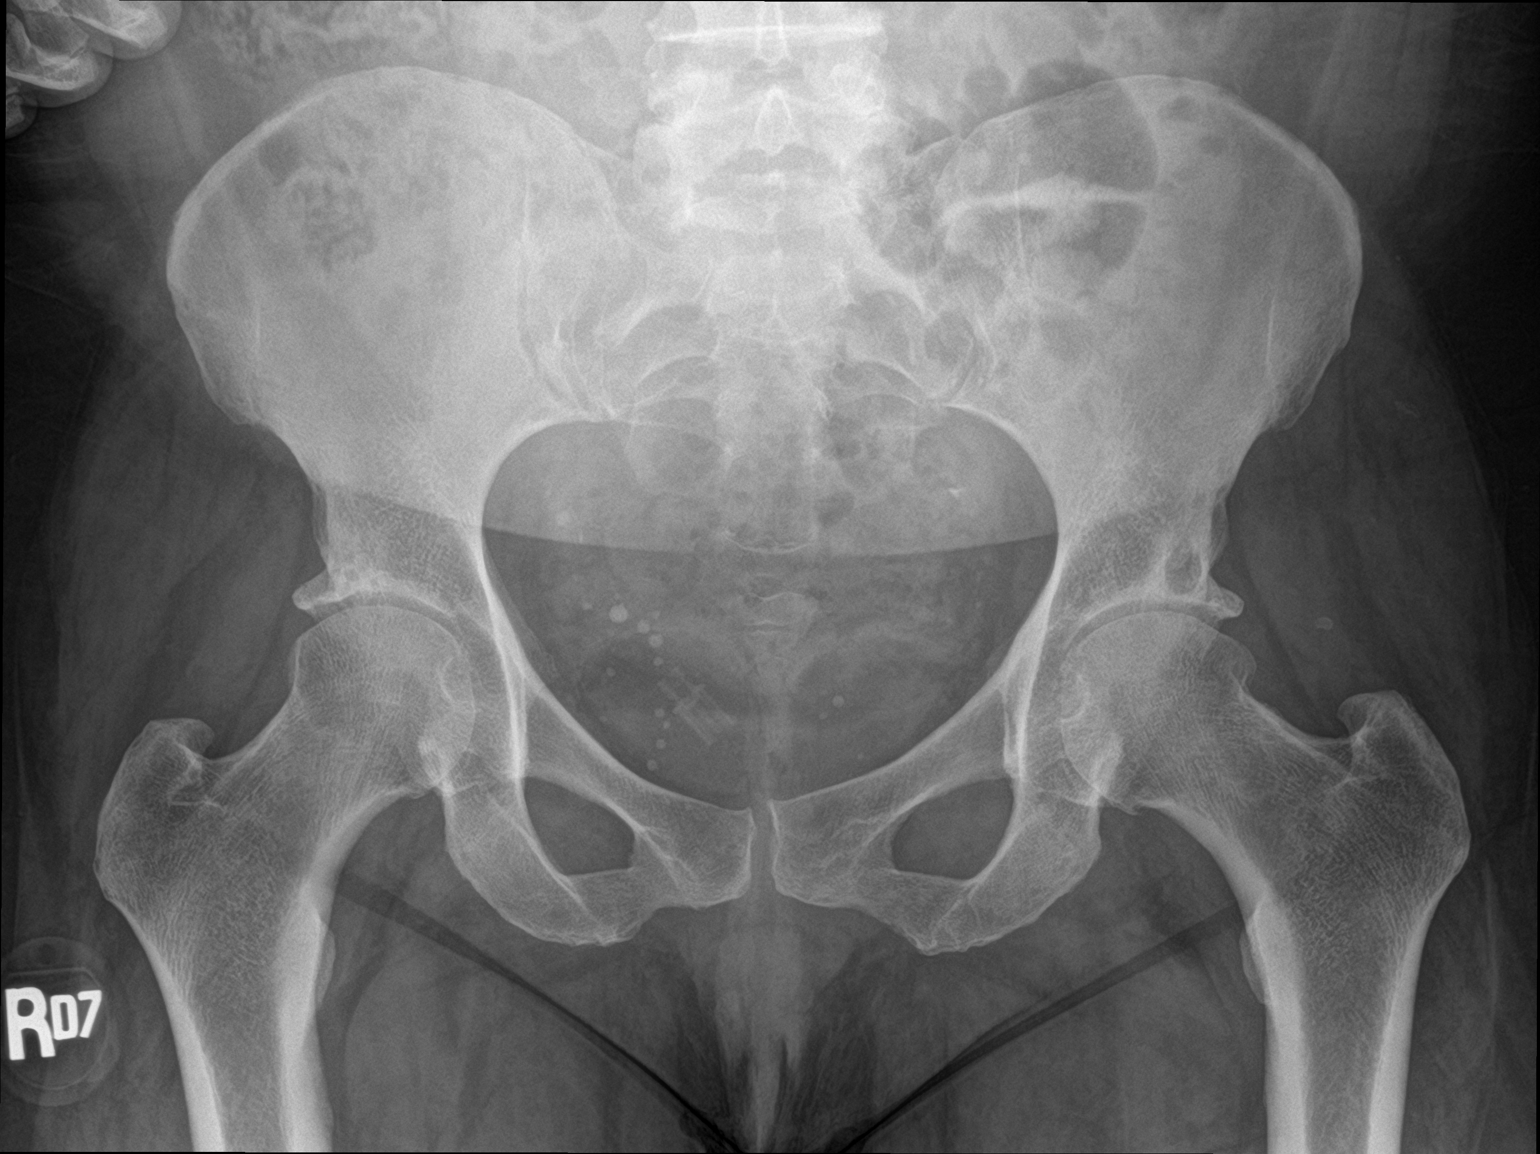

[hip ap]
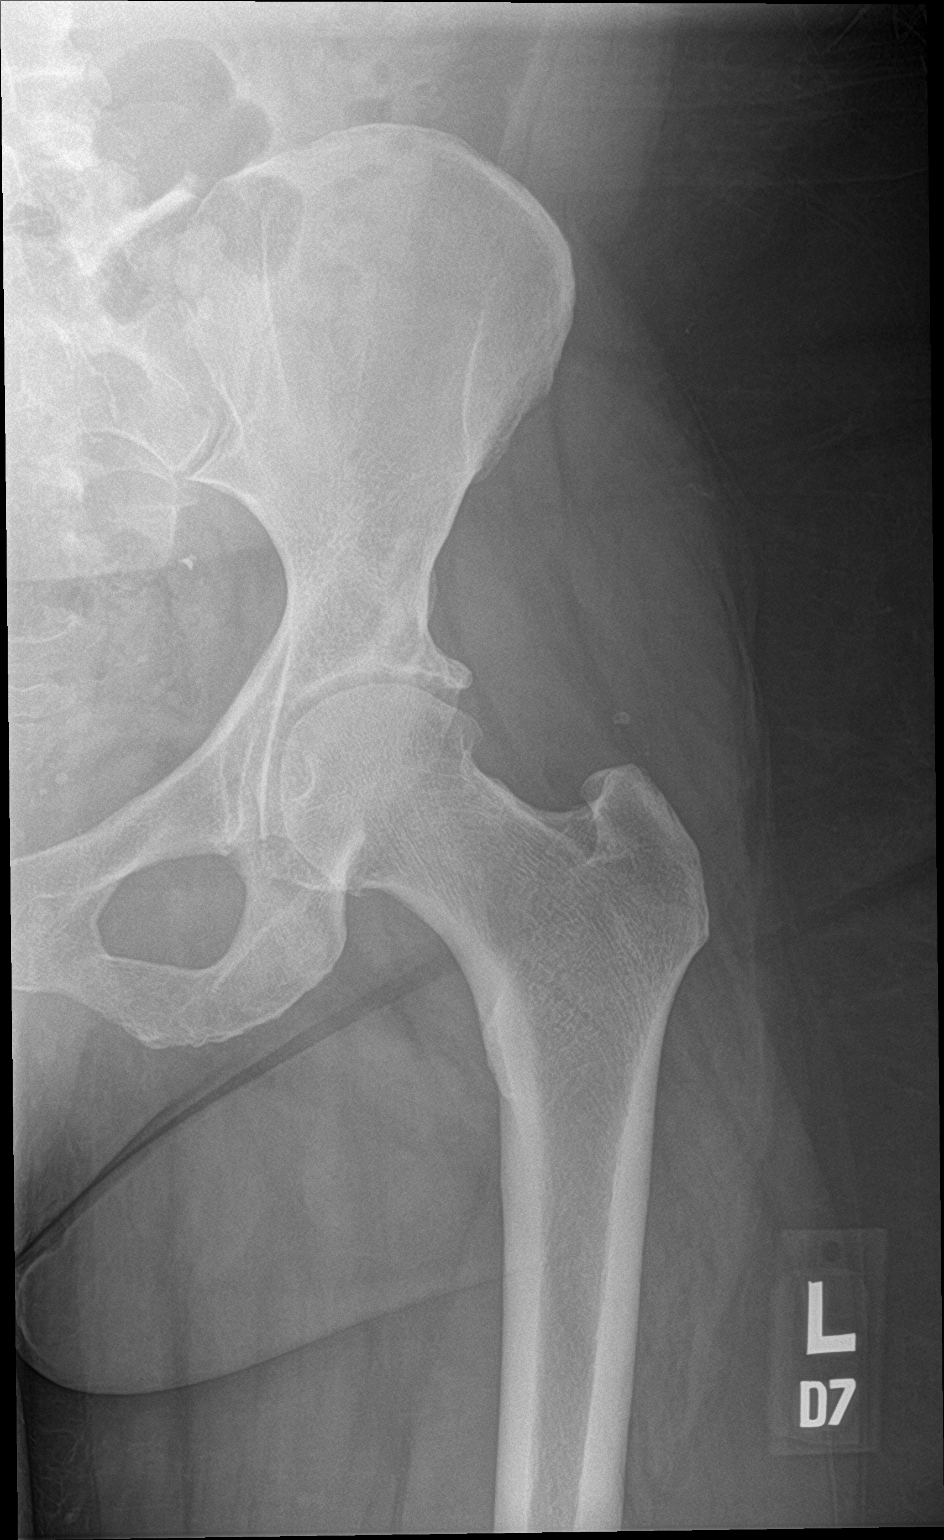

[hip frog leg]
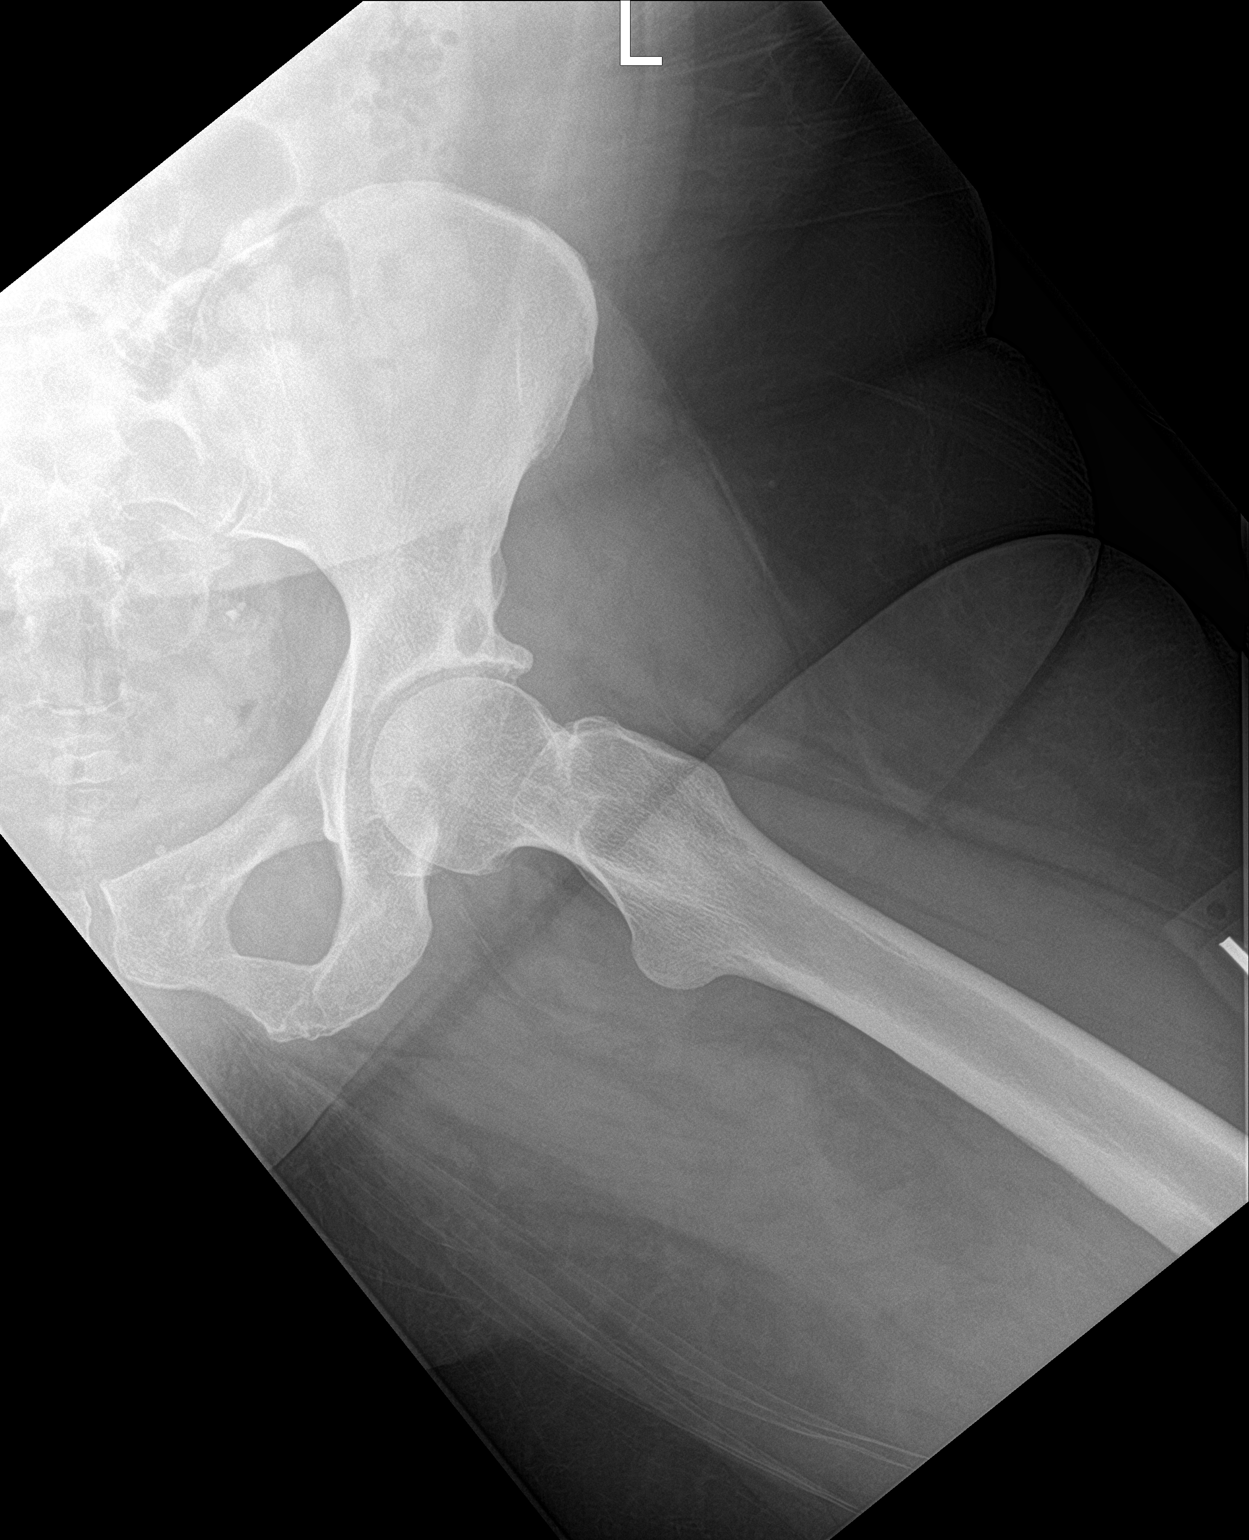

[3 of 3 positions shown; findings below may reference images not displayed]

FINDINGS: Mild medial joint space narrowing in the left hip. There is
acetabular sclerosis and cyst formation as well as osteophytosis.
Collar osteophytosis along both femoral heads. No acute osseous
abnormality.
IMPRESSION: Mild bilateral hip osteoarthritis.

## 2020-04-26 IMAGING — DX DG LUMBAR SPINE 2-3V
3 series · 3 of 3 positions shown · non-contrast
Comparison: None.

CLINICAL DATA: Low back pain with left-sided radicular symptoms

EXAM:
LUMBAR SPINE - 2-3 VIEW

[l-spine ap]
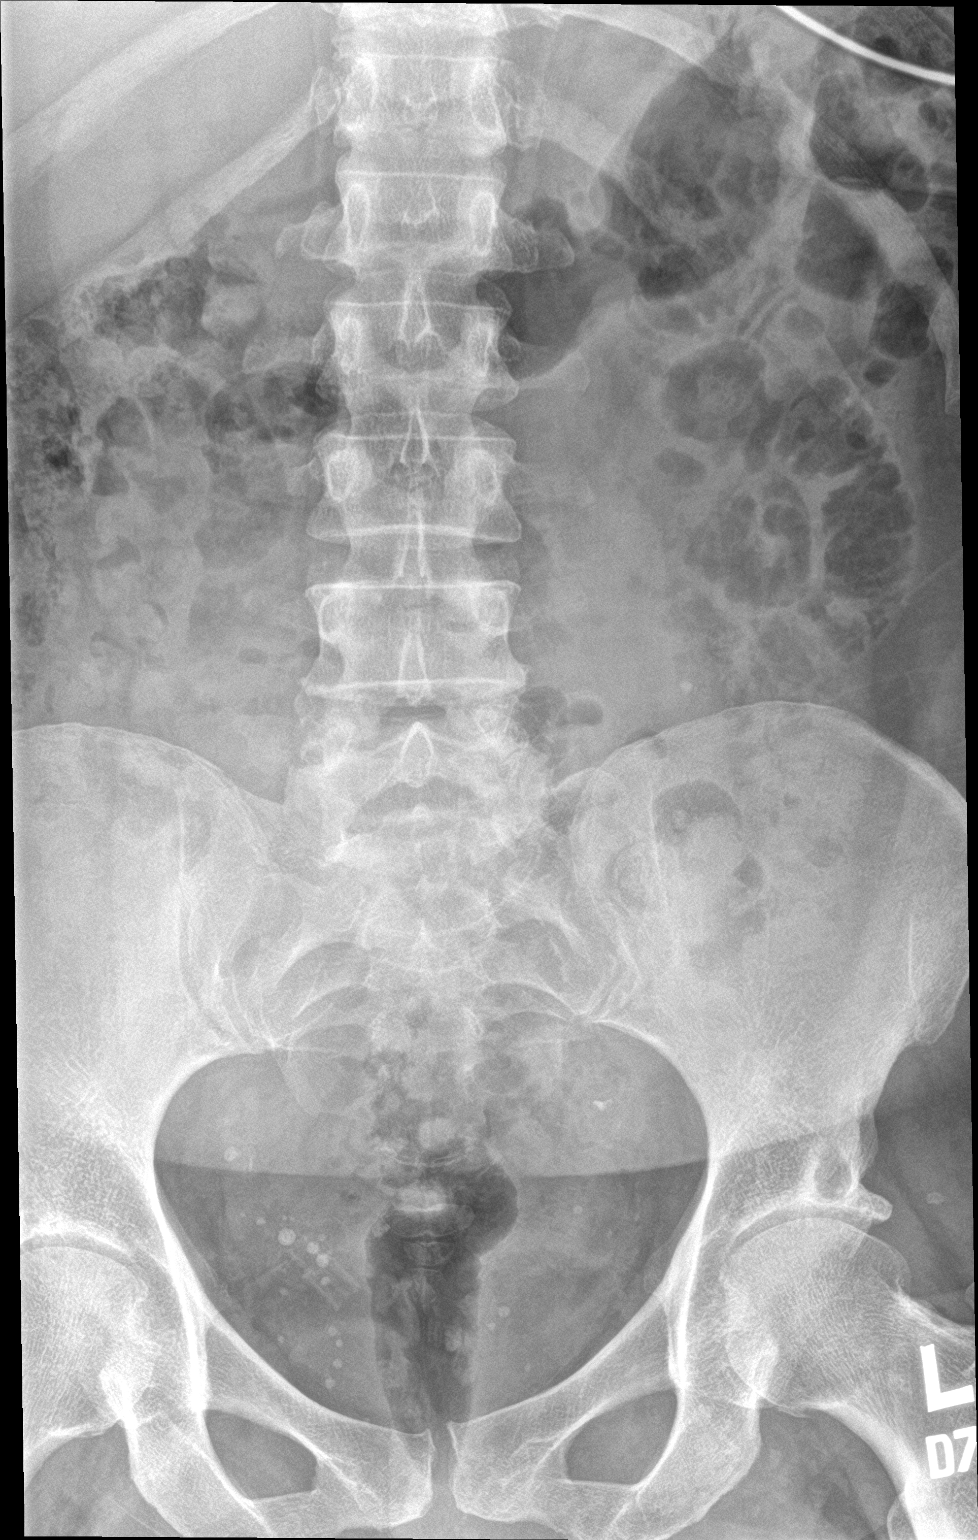

[l-spine lat]
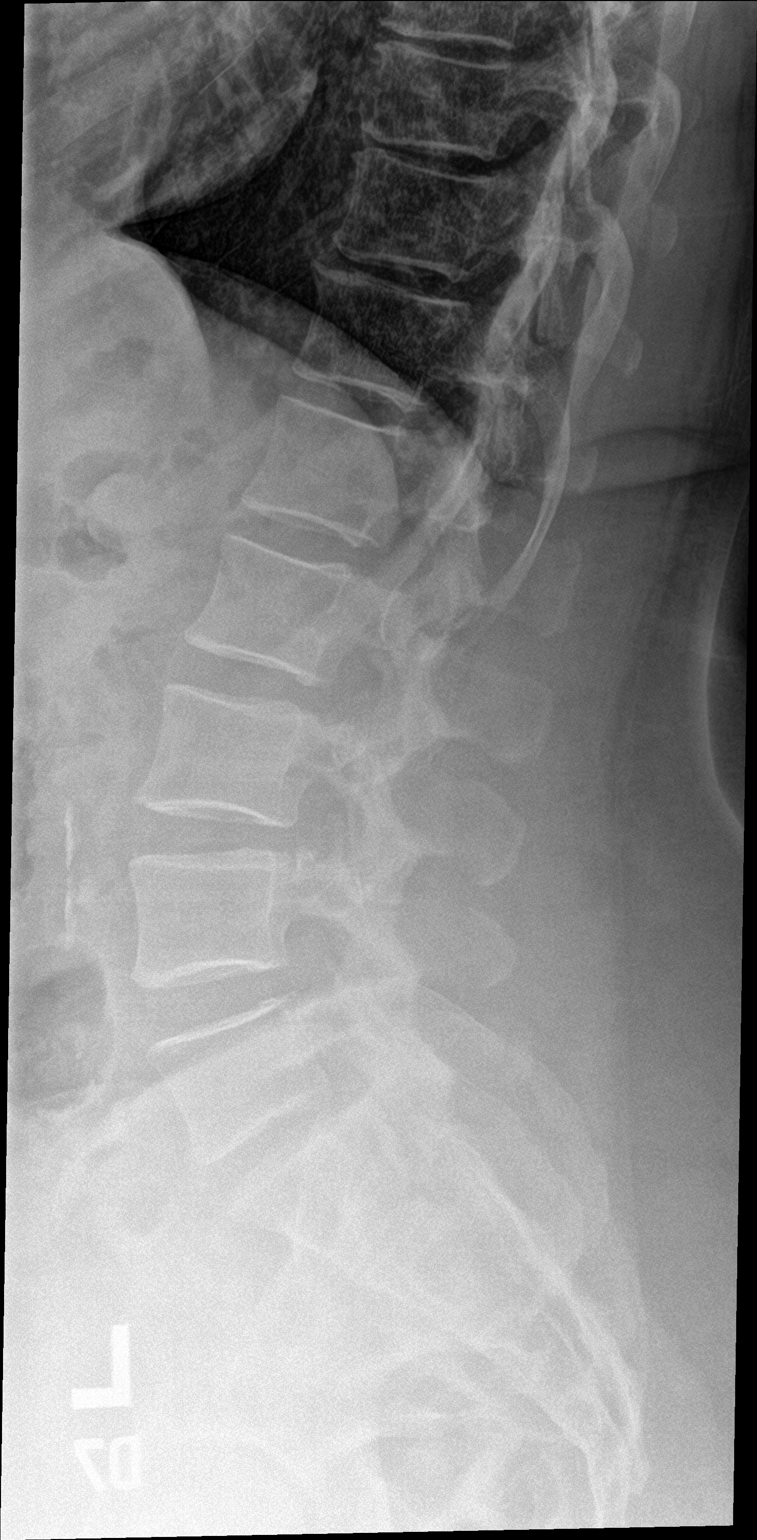

[l-spine spot]
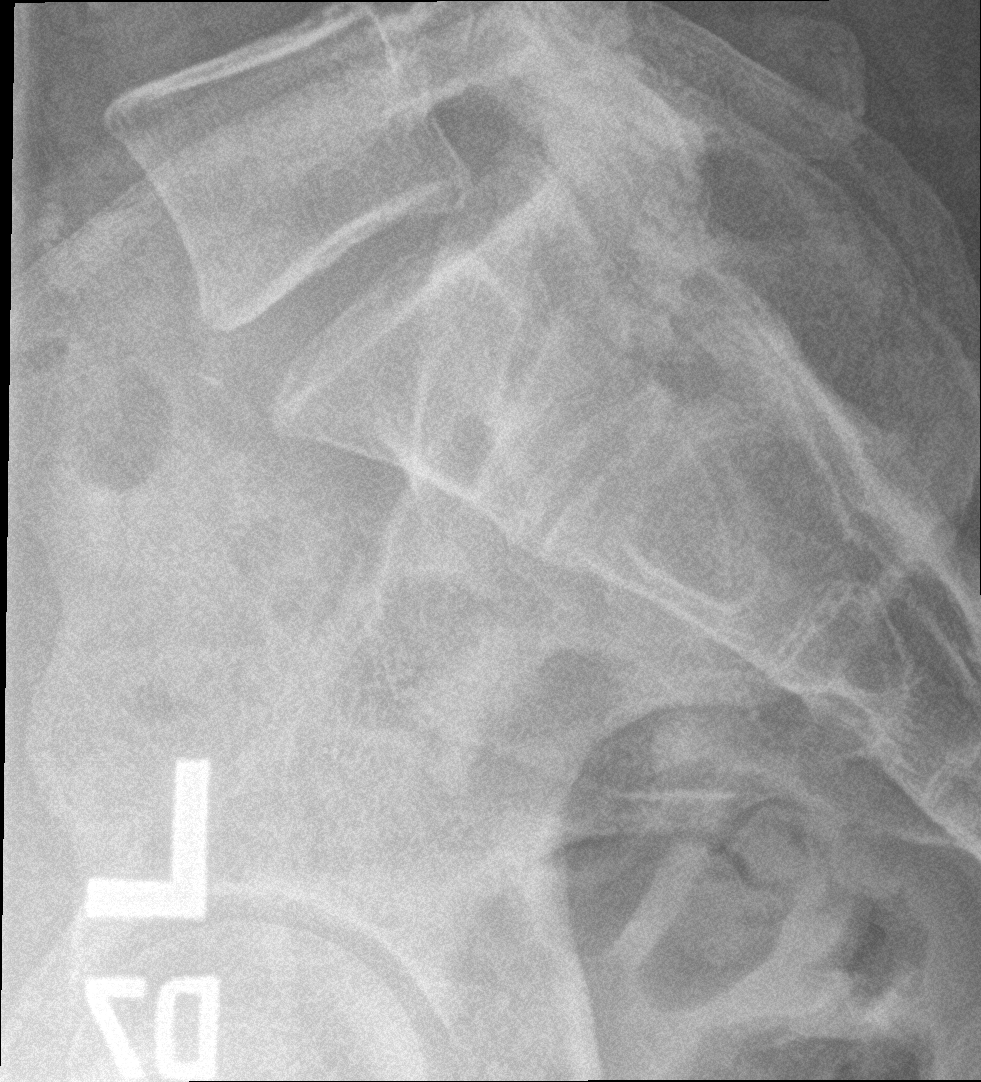

[3 of 3 positions shown; findings below may reference images not displayed]

FINDINGS: Frontal, lateral, and spot lumbosacral lateral images were obtained.
There are 5 non-rib-bearing lumbar type vertebral bodies. There is
no fracture or spondylolisthesis. The disc spaces appear normal.
There is facet osteoarthritic change at L5-S1 bilaterally. There is
aortic atherosclerosis.
IMPRESSION: Facet osteoarthritic change at L5-S1 bilaterally. No appreciable
disc space narrowing. No fracture or spondylolisthesis.

Aortic Atherosclerosis ([PK]-[PK]).

## 2020-05-06 ENCOUNTER — Other Ambulatory Visit: Payer: Self-pay

## 2020-05-06 ENCOUNTER — Ambulatory Visit (HOSPITAL_COMMUNITY)
Admission: RE | Admit: 2020-05-06 | Discharge: 2020-05-06 | Disposition: A | Discharge status: Home / Self Care | Payer: 59 | Source: Ambulatory Visit | Attending: Internal Medicine | Admitting: Internal Medicine

## 2020-05-06 DIAGNOSIS — Z1231 Encounter for screening mammogram for malignant neoplasm of breast: Secondary | ICD-10-CM | POA: Insufficient documentation

## 2020-05-06 IMAGING — MG DIGITAL SCREENING BILAT W/ TOMO W/ CAD
8 series · 9 of 24 positions shown · non-contrast
Comparison: Previous exam(s).

CLINICAL DATA: Screening.

EXAM:
DIGITAL SCREENING BILATERAL MAMMOGRAM WITH TOMO AND CAD

[R CC synth-2D]
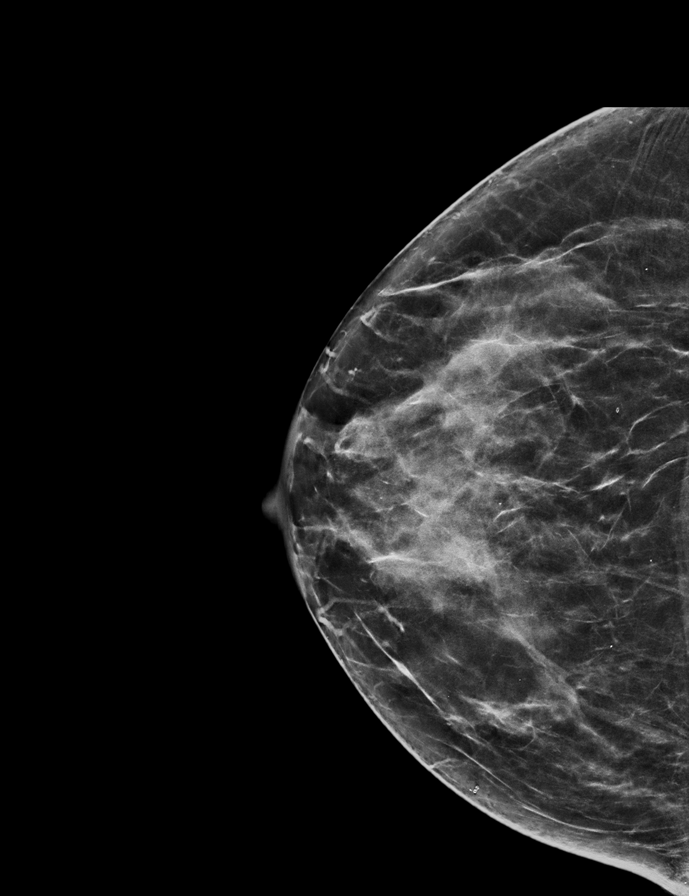

[L CC synth-2D]
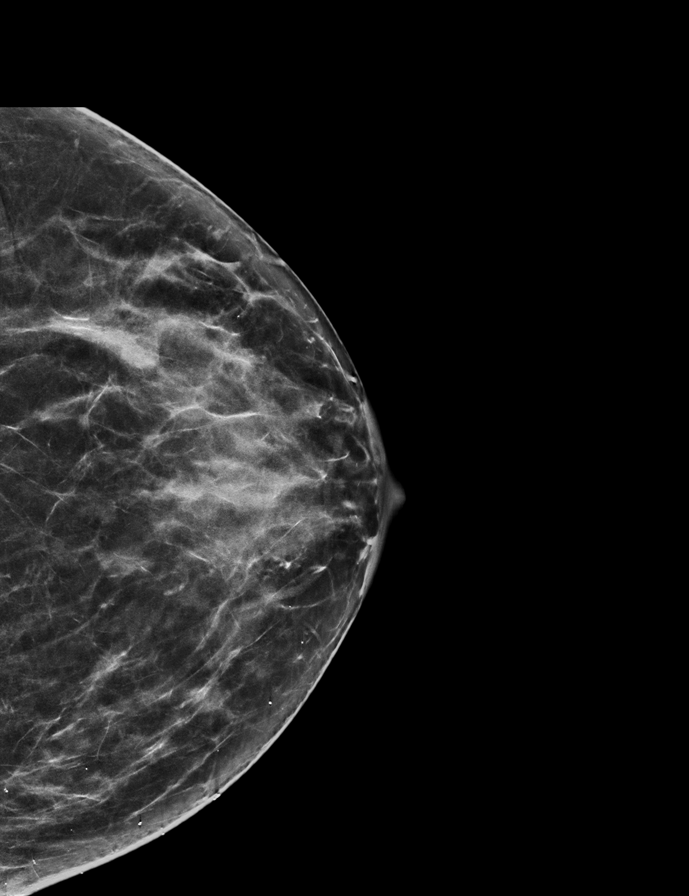

[R MLO synth-2D]
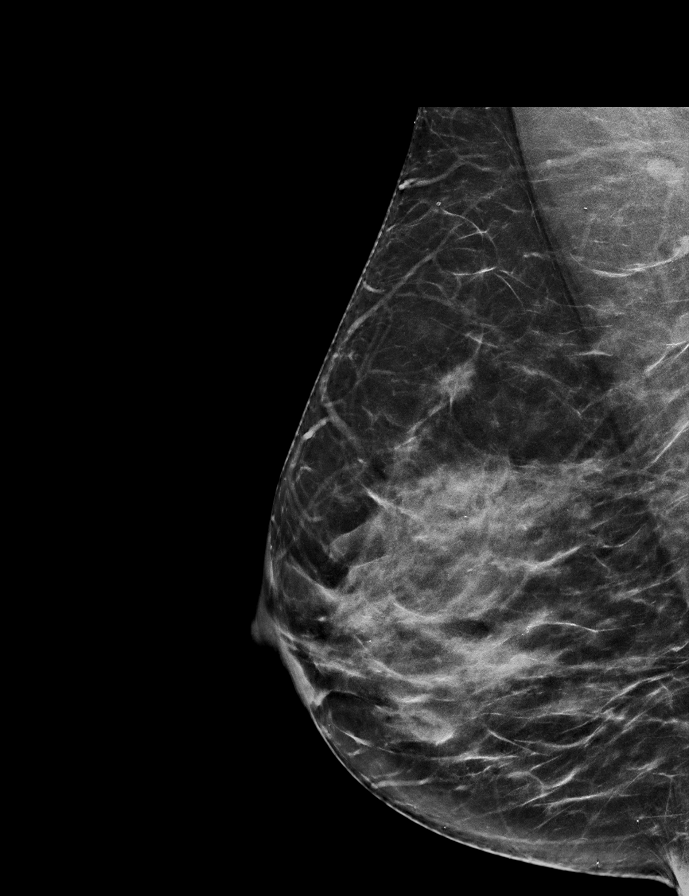

[L MLO synth-2D]
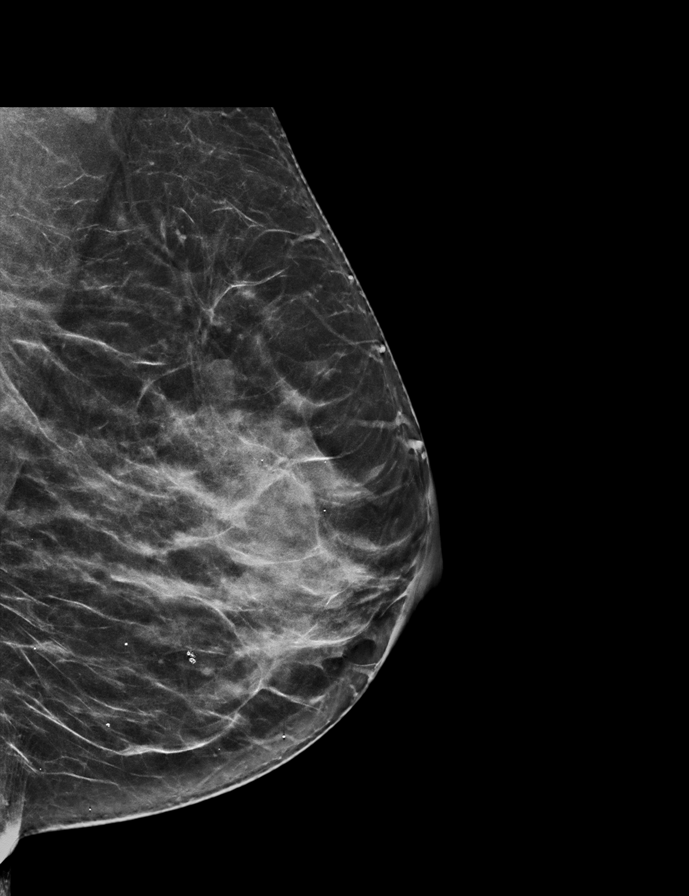

[L CC tomo · 2 of 65 frames shown]
[frame 21/65]
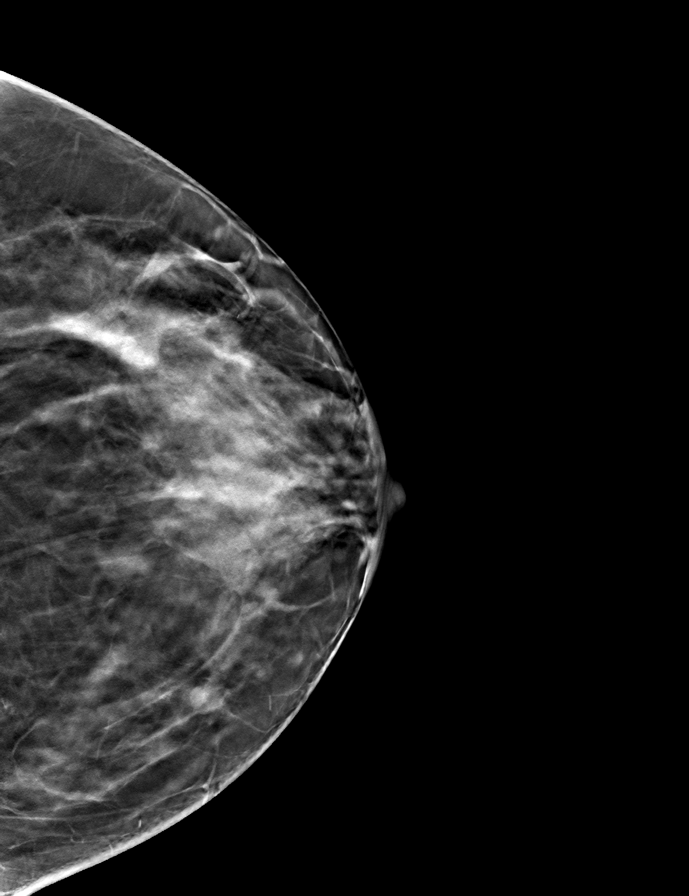
[frame 33/65]
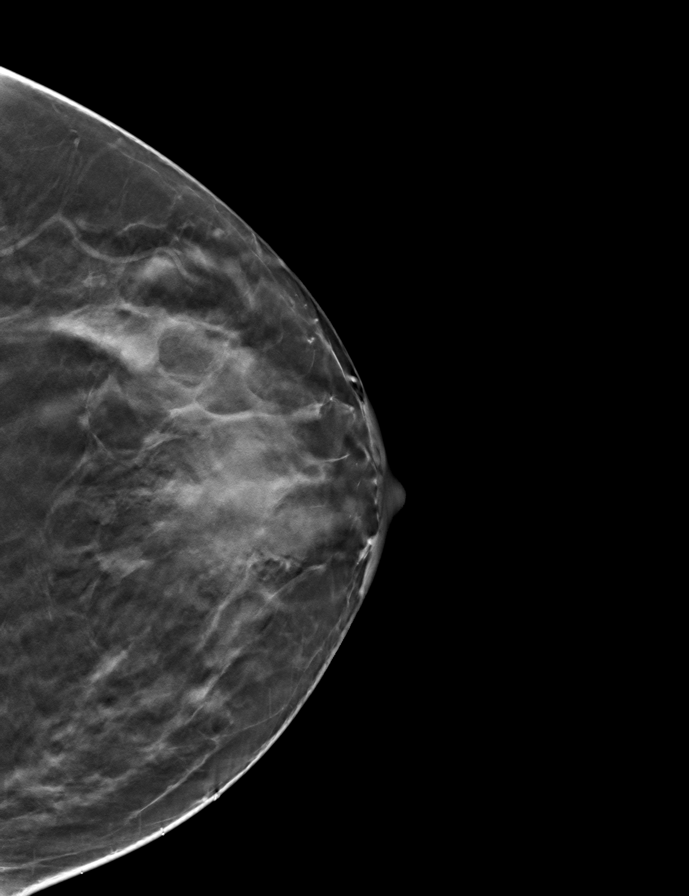

[R MLO tomo · tomo slice 33/66.0]
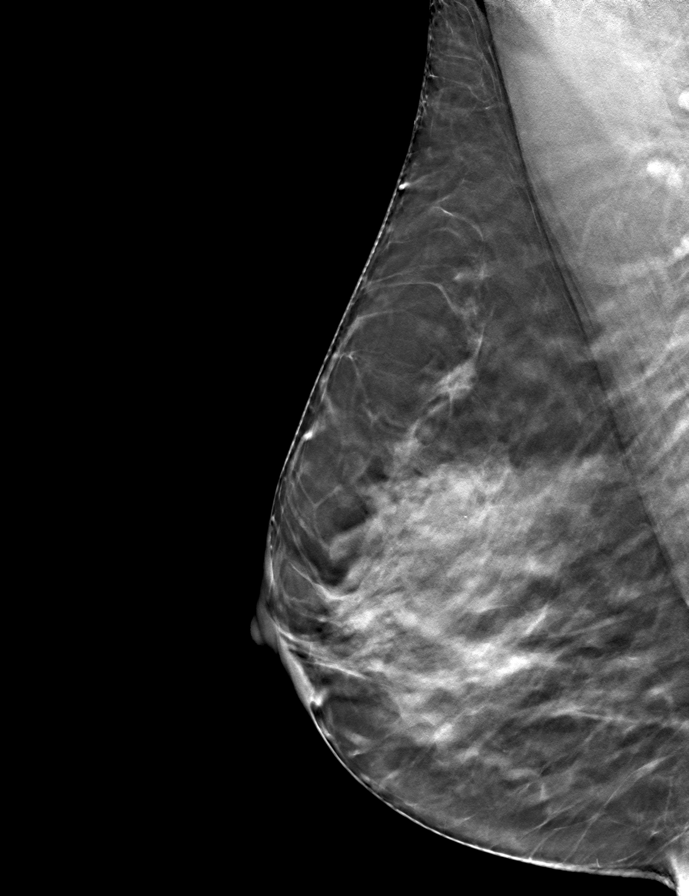

[R CC tomo · tomo slice 33/65.0]
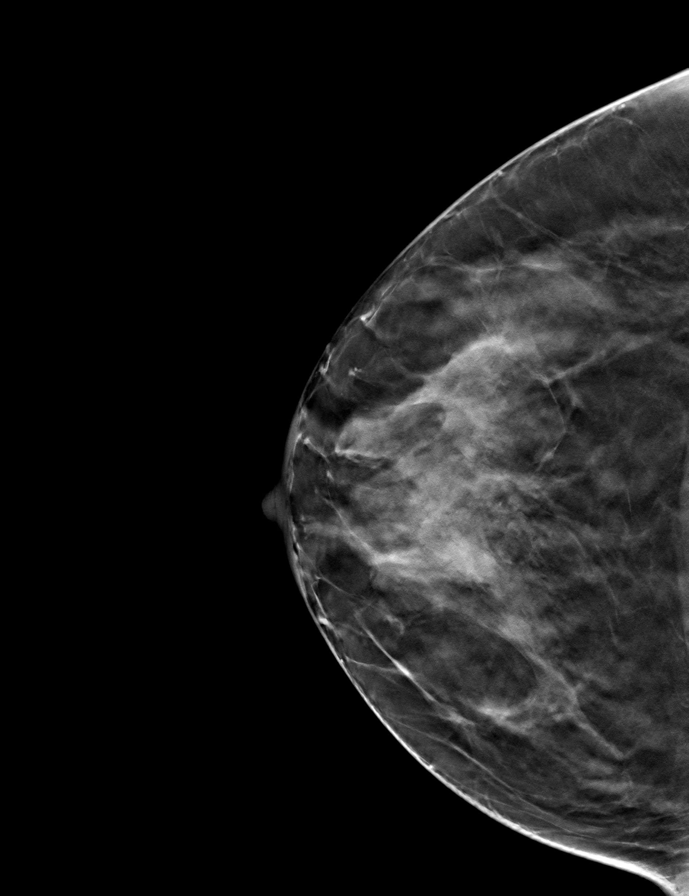

[L MLO tomo · tomo slice 33/64.0]
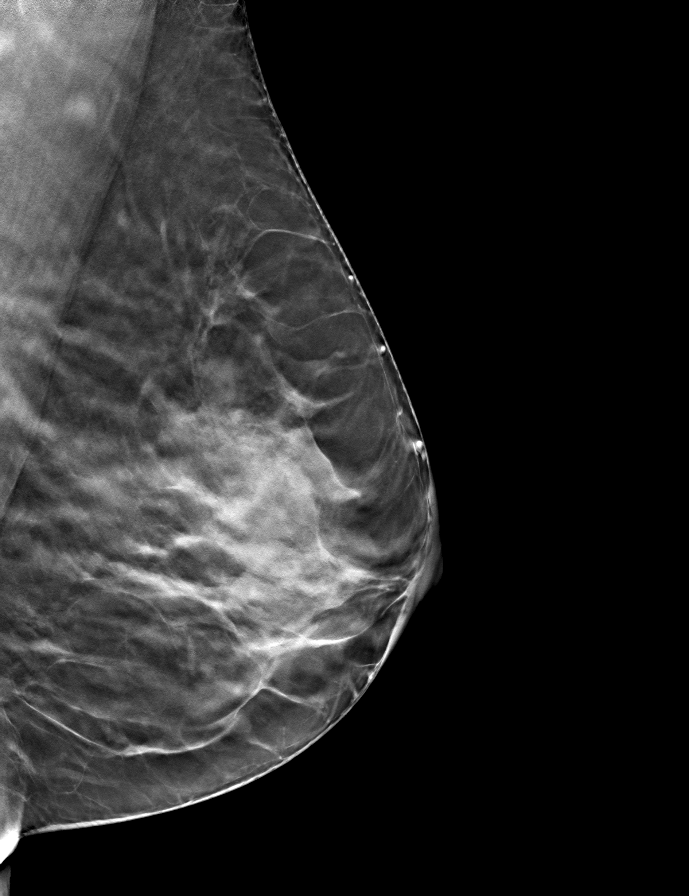

[9 of 24 positions shown; findings below may reference images not displayed]

ACR Breast Density Category c: The breast tissue is heterogeneously
dense, which may obscure small masses.
FINDINGS: In the right breast a possible asymmetry requires further
evaluation.

In the left breast a possible mass requires further evaluation.

Images were processed with CAD.
IMPRESSION: Further evaluation is suggested for possible asymmetry in the right
breast.

Further evaluation is suggested for possible mass in the left
breast.

RECOMMENDATION:
Diagnostic mammogram and possibly ultrasound of both breasts.
(Code:[VJ])

The patient will be contacted regarding the findings, and additional
imaging will be scheduled.

BI-RADS CATEGORY  0: Incomplete. Need additional imaging evaluation
and/or prior mammograms for comparison.

## 2020-05-12 ENCOUNTER — Other Ambulatory Visit (HOSPITAL_COMMUNITY): Payer: Self-pay | Admitting: Internal Medicine

## 2020-05-12 DIAGNOSIS — R928 Other abnormal and inconclusive findings on diagnostic imaging of breast: Secondary | ICD-10-CM

## 2020-05-13 ENCOUNTER — Ambulatory Visit (INDEPENDENT_AMBULATORY_CARE_PROVIDER_SITE_OTHER): Payer: 59 | Admitting: Orthopaedic Surgery

## 2020-05-13 ENCOUNTER — Other Ambulatory Visit: Payer: Self-pay

## 2020-05-13 ENCOUNTER — Encounter: Payer: Self-pay | Admitting: Orthopaedic Surgery

## 2020-05-13 VITALS — BP 179/84 | HR 70 | Ht 63.0 in | Wt 180.0 lb

## 2020-05-13 DIAGNOSIS — G8929 Other chronic pain: Secondary | ICD-10-CM

## 2020-05-13 DIAGNOSIS — M25552 Pain in left hip: Secondary | ICD-10-CM | POA: Diagnosis not present

## 2020-05-13 NOTE — Progress Notes (Signed)
Subjective:    Patient ID: Diane Parker, female    DOB: 09-14-1955, 64 y.o.   MRN: [id number]  HPI She has a long history of left hip pain getting worse over the last three months.  She has been followed by Dr. Gerarda Fraction and I have seen and read his notes.  She has tried Celebrex but stopped it as she does not like to take medicines.  She has no trauma.  She has pain of the left hip with activity now, she can only walk short distances without pain.  She has to stand for a few seconds after sitting a while before walking.  She has pain at night in the left hip and groin area.  She has no numbness.  She has pain that goes at times to the knee.  She has tried heat and ice.  She has had x-rays showing degenerative changes of the left hip and small areas of sclerosis and cyst formation of the acetabular region.  I have independently reviewed and interpreted x-rays of this patient done at another site by another physician or qualified health professional.     Review of Systems  Constitutional: Positive for activity change.  Musculoskeletal: Positive for arthralgias and gait problem.  [place] other systems reviewed and are negative.  For Review of Systems, [place] other systems reviewed and are negative.  The following is a summary of the past history medically, past history surgically, known current medicines, social history and family history.  This information is gathered electronically by the computer from prior information and documentation.  I review this each visit and have found including this information at this point in the chart is beneficial and informative.   Past Medical History:  Diagnosis Date  . Acute diverticulitis 03/28/2013   Seen on CT - treated with flagyl an cipro  . Arthritis    "shoulders, knees, hips" (03/29/2017)  . CAD (coronary artery disease), native coronary artery    03/29/17 PCI/DES to the mRCA, EF normal   . Essential hypertension   . GERD (gastroesophageal reflux  disease)   . Headache    "bad ones lately; maybe q other day" (03/29/2017)  . High cholesterol     Past Surgical History:  Procedure Laterality Date  . ABDOMINAL HYSTERECTOMY    . COLONOSCOPY  11/08/2007   HUD:[id number], rectosigmoid junction/hyperplastic-appearing polypoid    mucosa, 1 of these areas biopsied.  Otherwise, normal rectum/Left-sided transverse diverticula/Diminutive polyp just distal to the ileocecal valve. Hyperplastic and adenomatous polyp.   . COLONOSCOPY N/A 06/26/2013   IFO:YDXAJOI diverticulosis. Colonic polyps removed  . CORONARY ANGIOPLASTY WITH STENT PLACEMENT  03/29/2017  . CORONARY STENT INTERVENTION N/A 03/29/2017   Procedure: Coronary Stent Intervention;  Surgeon: Lorretta Harp, MD;  Location: [facility];  Service: Cardiovascular;  Laterality: N/A;  . DILATION AND CURETTAGE OF UTERUS    . ESOPHAGOGASTRODUODENOSCOPY  2008   RMR: normal  . LEFT HEART CATH AND CORONARY ANGIOGRAPHY N/A 03/29/2017   Procedure: Left Heart Cath and Coronary Angiography;  Surgeon: Lorretta Harp, MD;  Location: [facility];  Service: Cardiovascular;  Laterality: N/A;  . TUBAL LIGATION      Current Outpatient Medications on File Prior to Visit  Medication Sig Dispense Refill  . amitriptyline (ELAVIL) 10 MG tablet Take 10 mg by mouth at bedtime.     Marland Kitchen aspirin 81 MG chewable tablet Chew 1 tablet (81 mg total) by mouth daily. 30 tablet 0  . b  complex vitamins tablet Take 1 tablet by mouth daily.    . diclofenac sodium (VOLTAREN) 1 % GEL Apply topically 4 (four) times daily.    Marland Kitchen estradiol (ESTRACE) 2 MG tablet TAKE ONE TABLET BY MOUTH EVERY DAY 30 tablet 6  . ferrous sulfate 325 (65 FE) MG EC tablet Take 325 mg by mouth daily with breakfast.    . hydrALAZINE (APRESOLINE) 25 MG tablet Take 25 mg by mouth 3 (three) times daily.    . hydrochlorothiazide (HYDRODIURIL) 25 MG tablet Take 25 mg by mouth daily.    . hydrOXYzine (ATARAX/VISTARIL) 10 MG tablet Take 10 mg  by mouth 3 (three) times daily as needed.    . montelukast (SINGULAIR) 10 MG tablet Take 10 mg by mouth at bedtime.     . Multiple Vitamin (MULTIVITAMIN WITH MINERALS) TABS tablet Take 1 tablet by mouth daily.    . Multiple Vitamins-Minerals (HAIR/SKIN/NAILS PO) Take 1 tablet by mouth daily.     . nitroGLYCERIN (NITROSTAT) 0.4 MG SL tablet Place 1 tablet (0.4 mg total) under the tongue every 5 (five) minutes x 3 doses as needed. 25 tablet 3  . pantoprazole (PROTONIX) 20 MG tablet Take 20 mg by mouth daily.    . polyethylene glycol-electrolytes (TRILYTE) 420 G solution Take 4,000 mLs by mouth as directed. 4000 mL 0  . potassium chloride (KLOR-CON) 20 MEQ packet Take 20 mEq by mouth daily.    . rosuvastatin (CRESTOR) 40 MG tablet Take 1 tablet (40 mg total) by mouth daily. 90 tablet 3  . sertraline (ZOLOFT) 25 MG tablet Take 25 mg by mouth daily as needed (Hot Flashes).    . traMADol (ULTRAM) 50 MG tablet Take 50 mg by mouth as needed for pain.    Marland Kitchen UNABLE TO FIND ASA 500MG  AND CAFFEINE  32.5MG  AS NEEDED     . Zinc Sulfate (ZINC 15 PO) Take by mouth.     . carvedilol (COREG) 6.25 MG tablet Take 1 tablet (6.25 mg total) by mouth 2 (two) times daily. 180 tablet 1   No current facility-administered medications on file prior to visit.    Social History   Socioeconomic History  . Marital status: Single    Spouse name: Not on file  . Number of children: Not on file  . Years of education: Not on file  . Highest education level: Not on file  Occupational History  . Not on file  Tobacco Use  . Smoking status: Former Smoker    Packs/day: 0.12    Years: 3.00    Pack years: 0.36    Types: Cigarettes    Quit date: 1990    Years since quitting: 31.7  . Smokeless tobacco: Never Used  Vaping Use  . Vaping Use: Never used  Substance and Sexual Activity  . Alcohol use: Yes    Alcohol/week: 3.0 standard drinks    Types: 3 Cans of beer per week  . Drug use: Not Currently    Types: Marijuana     Comment: 03/29/2017 "smoked a little weed when I was young"  . Sexual activity: Yes    Birth control/protection: Surgical    Comment: tubal/hyst  Other Topics Concern  . Not on file  Social History Narrative  . Not on file   Social Determinants of Health   Financial Resource Strain: Low Risk   . Difficulty of Paying Living Expenses: Not hard at [place]  Food Insecurity: No Food Insecurity  . Worried About Charity fundraiser  in the Last Year: Never true  . Ran Out of Food in the Last Year: Never true  Transportation Needs: No Transportation Needs  . Lack of Transportation (Medical): No  . Lack of Transportation (Non-Medical): No  Physical Activity: Sufficiently Active  . Days of Exercise per Week: 6 days  . Minutes of Exercise per Session: 60 min  Stress: No Stress Concern Present  . Feeling of Stress : Only a little  Social Connections: Unknown  . Frequency of Communication with Friends and Family: More than three times a week  . Frequency of Social Gatherings with Friends and Family: More than three times a week  . Attends Religious Services: 1 to 4 times per year  . Active Member of Clubs or Organizations: Yes  . Attends Archivist Meetings: 1 to 4 times per year  . Marital Status: Not on file  Intimate Partner Violence: Not At Risk  . Fear of Current or Ex-Partner: No  . Emotionally Abused: No  . Physically Abused: No  . Sexually Abused: No    Family History  Problem Relation Age of Onset  . Aneurysm Mother   . Heart disease Father   . Cancer Cousin        breast  . Colon cancer Neg Hx     BP (!) 179/84   Pulse 70   Ht 5\' 3"  (1.6 m)   Wt 180 lb (81.6 kg)   BMI 31.89 kg/m   Body mass index is 31.89 kg/m.      Objective:   Physical Exam Vitals and nursing note reviewed.  Constitutional:      Appearance: She is well-developed.  HENT:     Head: Normocephalic and atraumatic.  Eyes:     Conjunctiva/sclera: Conjunctivae normal.     Pupils: Pupils  are equal, round, and reactive to light.  Cardiovascular:     Rate and Rhythm: Normal rate and regular rhythm.  Pulmonary:     Effort: Pulmonary effort is normal.  Abdominal:     Palpations: Abdomen is soft.  Musculoskeletal:     Cervical back: Normal range of motion and neck supple.       Legs:  Skin:    General: Skin is warm and dry.  Neurological:     Mental Status: She is alert and oriented to person, place, and time.     Cranial Nerves: No cranial nerve deficit.     Motor: No abnormal muscle tone.     Coordination: Coordination normal.     Deep Tendon Reflexes: Reflexes are normal and symmetric. Reflexes normal.  Psychiatric:        Behavior: Behavior normal.        Thought Content: Thought content normal.        Judgment: Judgment normal.           Assessment & Plan:   Encounter Diagnosis  Name Primary?  . Chronic left hip pain Yes   I will get a MRI of the left hip to determine the severity level and how much joint space she has left.  She does not want surgery.  I have told her to resume the Celebrex.  She does not want to.  She cannot take narcotics, they make her sick.  She declines a trial of prednisone dose pack.  Return in two weeks.  Call if any problem.  Precautions discussed.   Electronically Signed Sanjuana Kava, MD 9/9/20219:09 AM

## 2020-05-19 ENCOUNTER — Ambulatory Visit (HOSPITAL_COMMUNITY)
Admission: RE | Admit: 2020-05-19 | Discharge: 2020-05-19 | Disposition: A | Discharge status: Home / Self Care | Payer: 59 | Source: Ambulatory Visit | Attending: Internal Medicine | Admitting: Internal Medicine

## 2020-05-19 ENCOUNTER — Other Ambulatory Visit: Payer: Self-pay

## 2020-05-19 DIAGNOSIS — R928 Other abnormal and inconclusive findings on diagnostic imaging of breast: Secondary | ICD-10-CM | POA: Insufficient documentation

## 2020-05-19 IMAGING — MG DIGITAL DIAGNOSTIC BILAT W/ TOMO W/ CAD
6 of 10 series · 6 of 30 positions shown · non-contrast
Comparison: Previous exams.

CLINICAL DATA: Screening recall for right breast asymmetry and left
breast mass.

EXAM:
DIGITAL DIAGNOSTIC BILATERAL MAMMOGRAM WITH TOMO AND CAD; ULTRASOUND
LEFT BREAST LIMITED; ULTRASOUND RIGHT BREAST LIMITED

[R MLO synth-2D (1 of 2)]
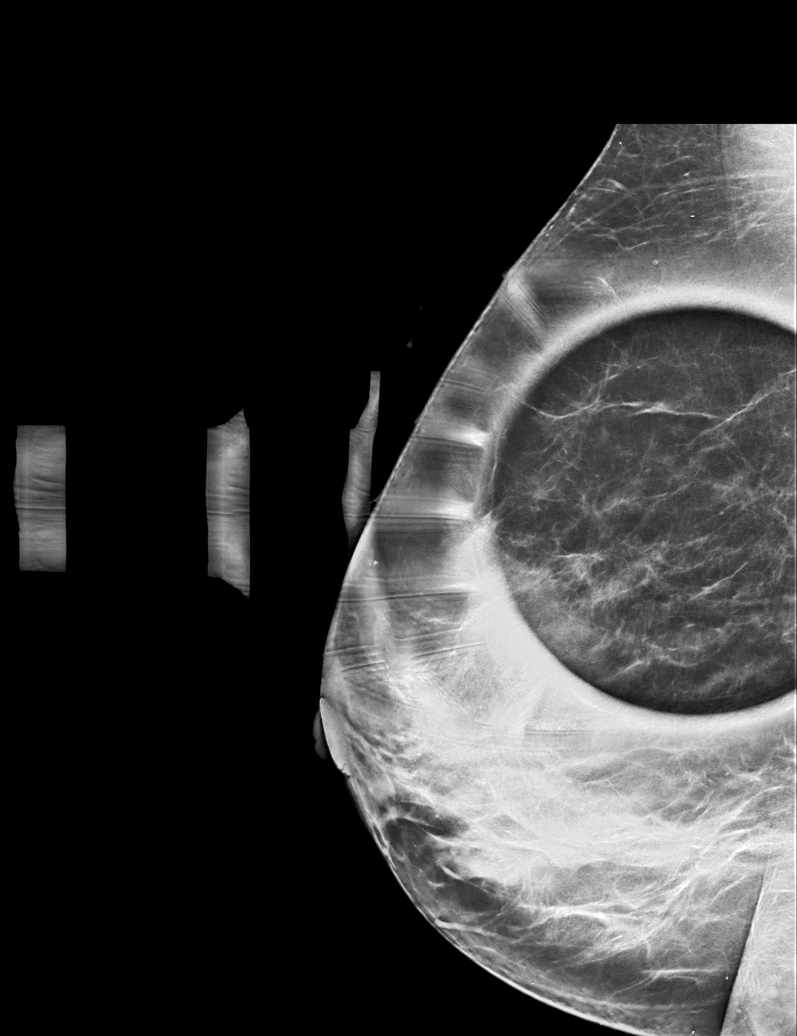

[L CC synth-2D]
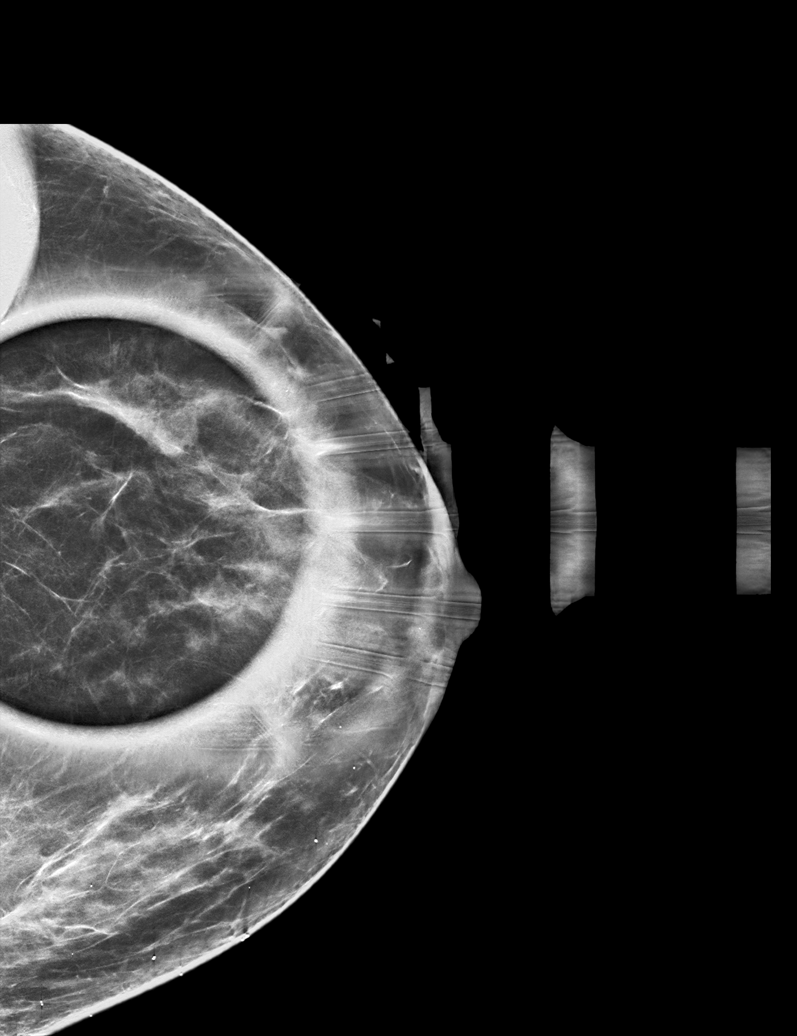

[L MLO synth-2D]
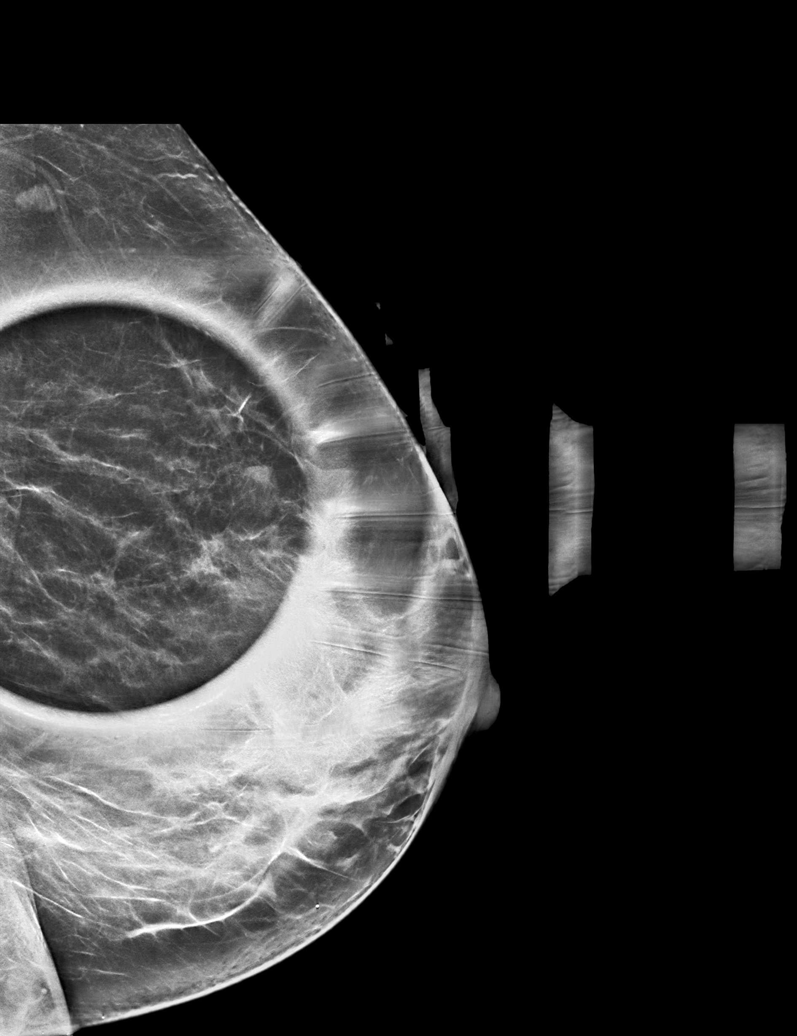

[R MLO synth-2D (2 of 2)]
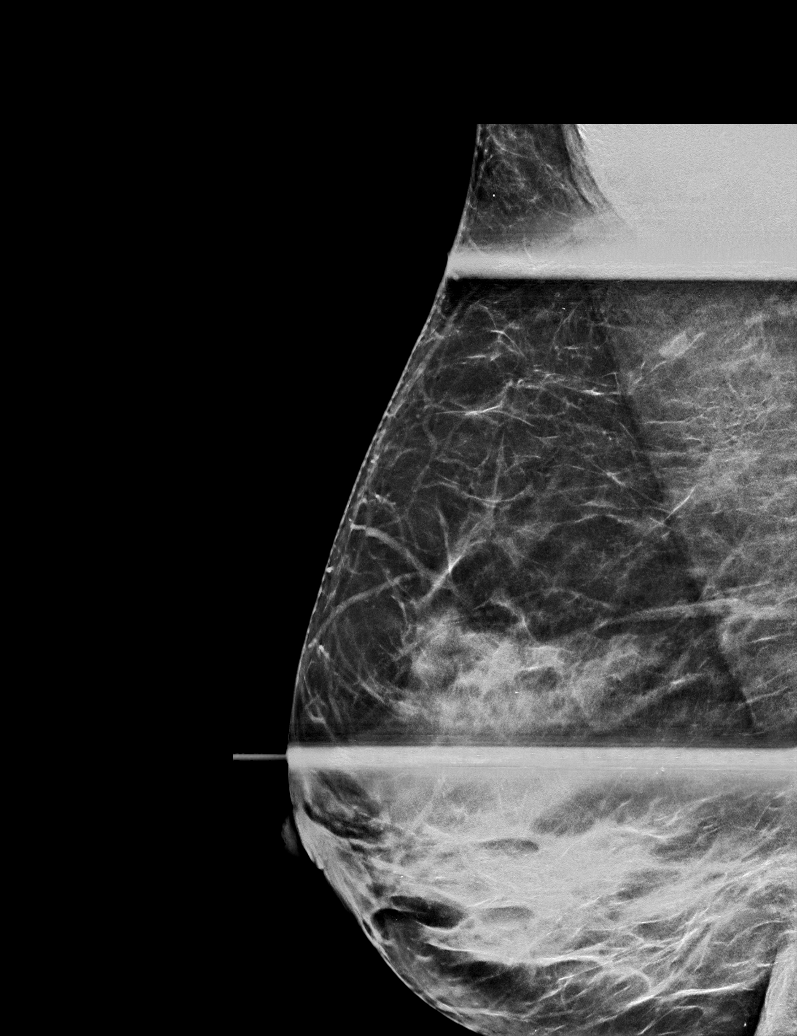

[R ML synth-2D]
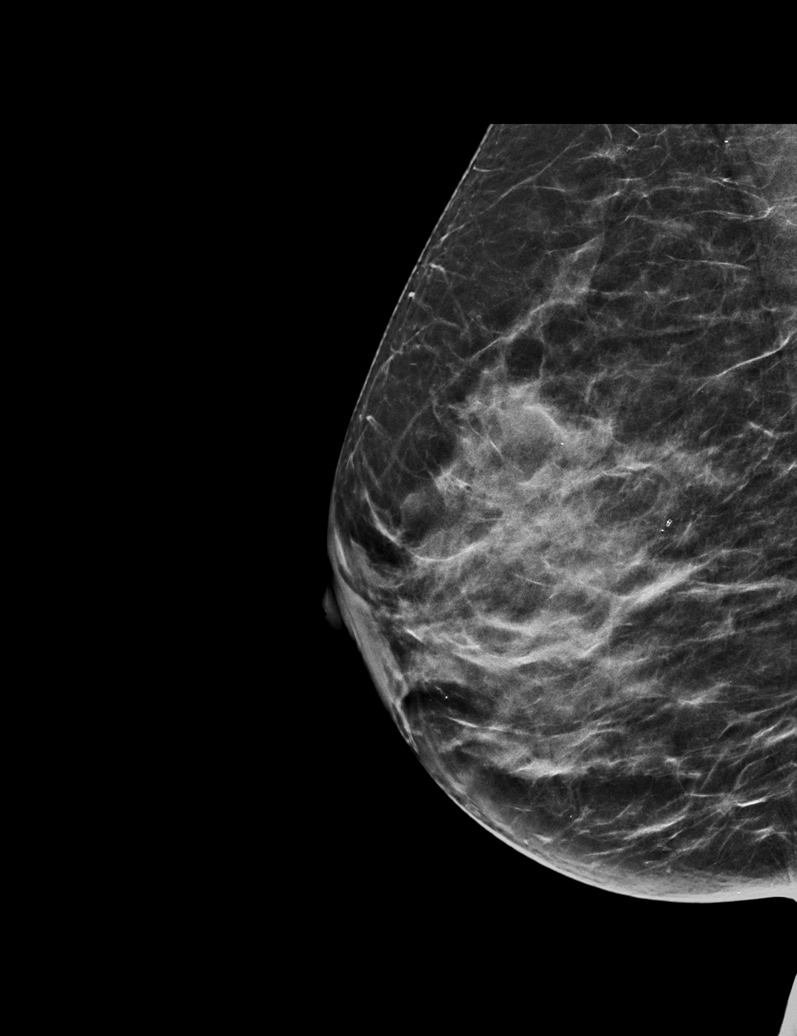

[R MLO tomo · tomo slice 31/61.0]
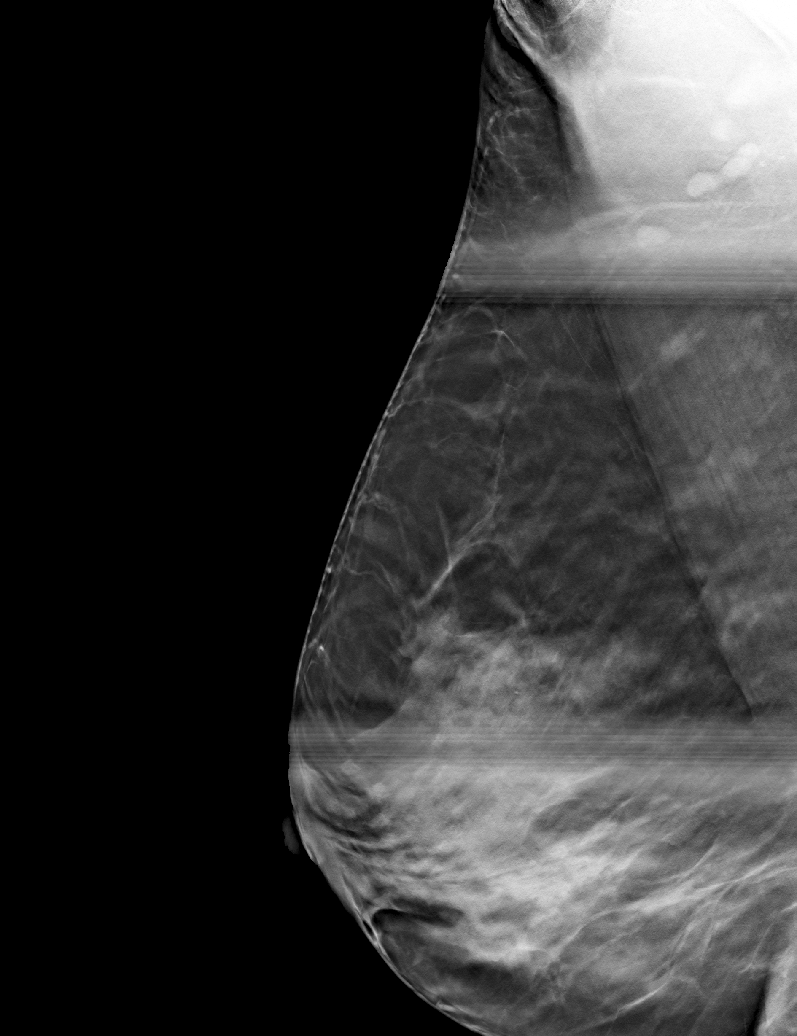

[6 of 30 positions shown; findings below may reference images not displayed]

ACR Breast Density Category c: The breast tissue is heterogeneously
dense, which may obscure small masses.
FINDINGS: Additional tomograms were performed of the bilateral breasts. There
is an oval circumscribed mass in the upper-outer left breast
measuring approximately 1 cm. The initially questioned asymmetry in
the right breast is felt to resolve on the additional images and
possibly related to an area of normal/dense fibroglandular tissue.

Mammographic images were processed with CAD.

Targeted ultrasound of the left breast was performed. There is a
cyst at 2 o'clock 4 cm from nipple measuring 1 x 0.6 by 1.2 cm. This
corresponds well with the mass seen in the left breast mammography.
Several areas of fibrocystic change are seen in the central and
outer left breast.

Targeted ultrasound of the superior right breast was performed
demonstrating several areas of fibrocystic change. A lobulated cyst
in the right breast at the [DATE] position 2 cm from nipple measures
0.6 x 0.2 x 0.6 cm. This is felt to correspond with the initially
questioned asymmetry seen in the right breast at mammography.
IMPRESSION: No findings of malignancy in either breast.

RECOMMENDATION:
Screening mammogram in one year.(Code:[EK])

I have discussed the findings and recommendations with the patient.
If applicable, a reminder letter will be sent to the patient
regarding the next appointment.

BI-RADS CATEGORY  2: Benign.

## 2020-05-19 IMAGING — US US BREAST*L* LIMITED INC AXILLA
1 series · 13 of 16 positions shown · non-contrast
Comparison: Previous exams.

CLINICAL DATA: Screening recall for right breast asymmetry and left
breast mass.

EXAM:
DIGITAL DIAGNOSTIC BILATERAL MAMMOGRAM WITH TOMO AND CAD; ULTRASOUND
LEFT BREAST LIMITED; ULTRASOUND RIGHT BREAST LIMITED

[Series 1: us breast*left* limited inc axilla · 0.07mm/px · 13 of 16 slices shown]
[im 1/16]
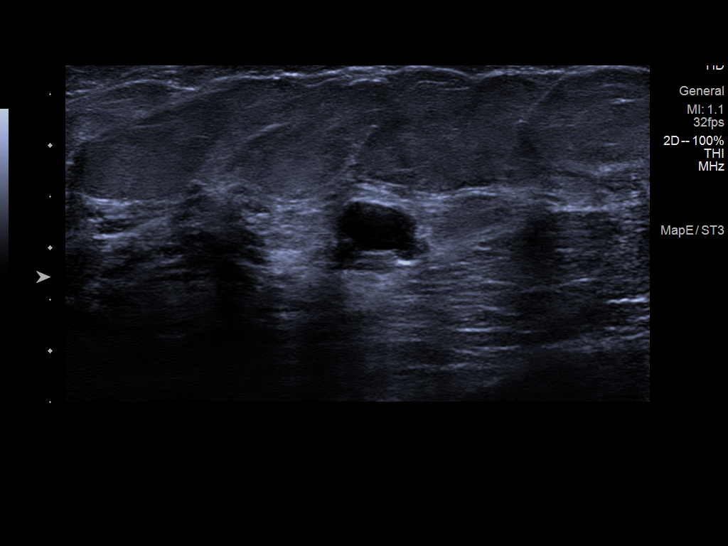
[im 2/16]
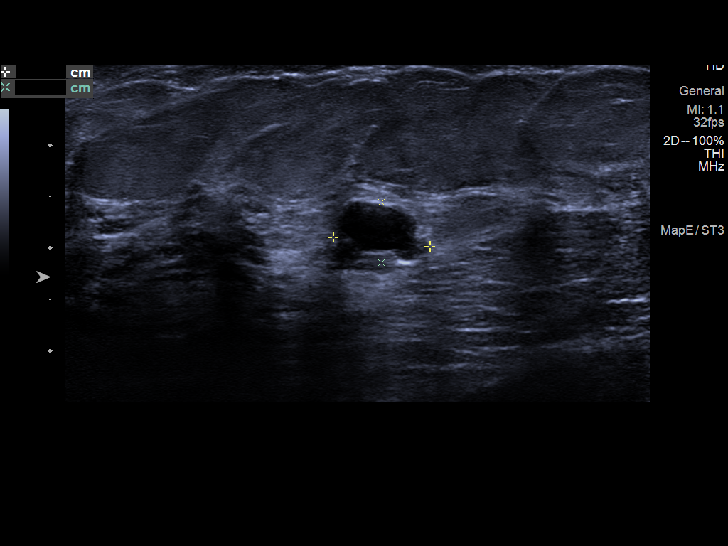
[im 4/16]
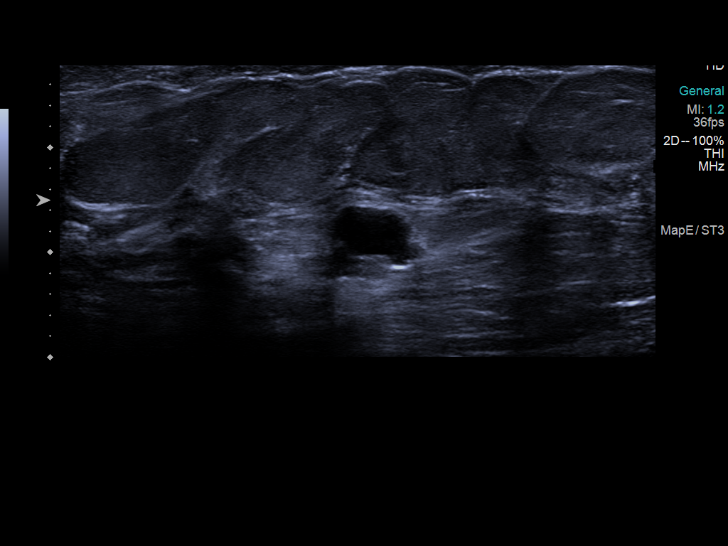
[im 5/16]
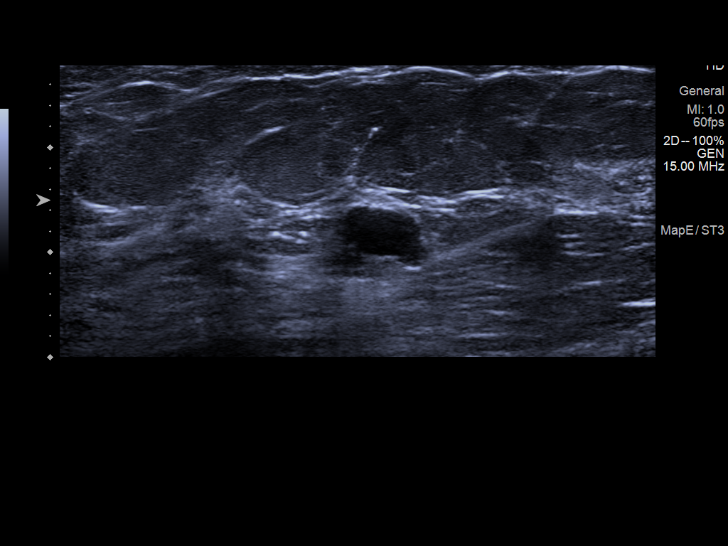
[im 6/16]
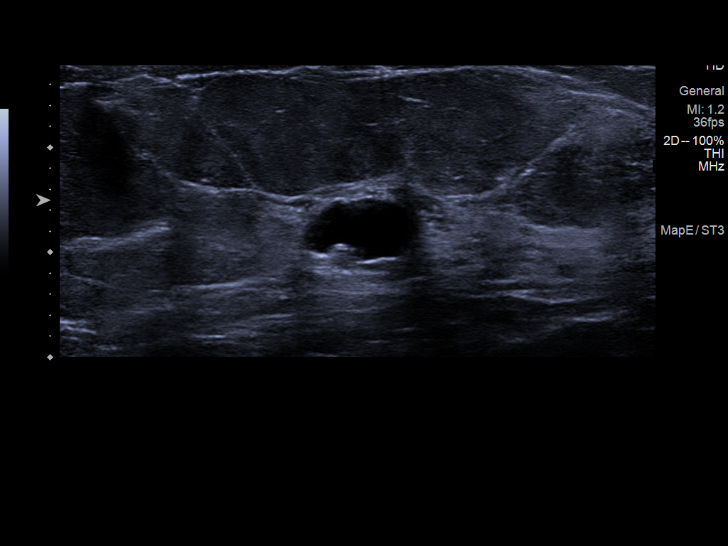
[im 7/16]
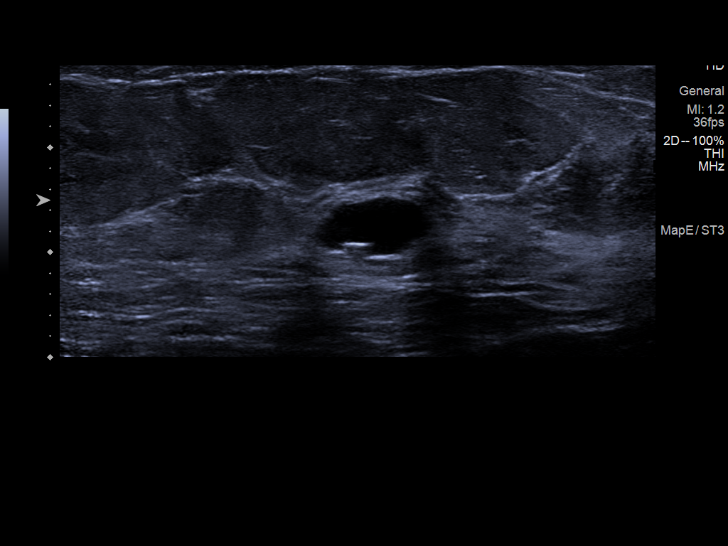
[im 9/16]
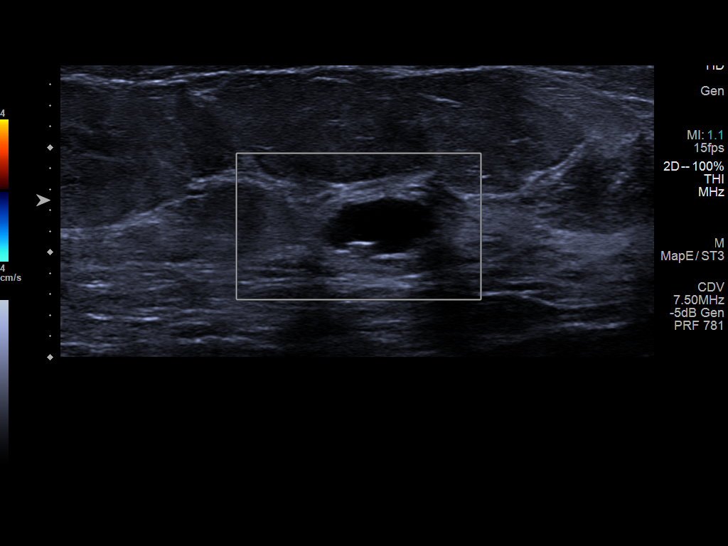
[im 10/16]
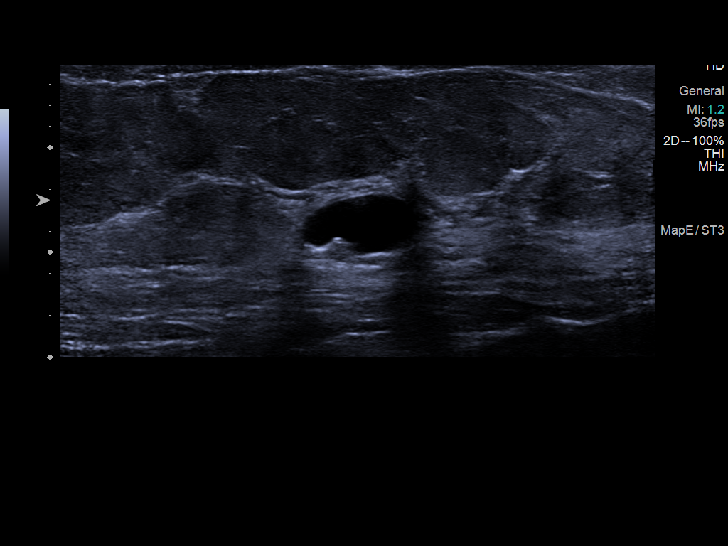
[im 11/16]
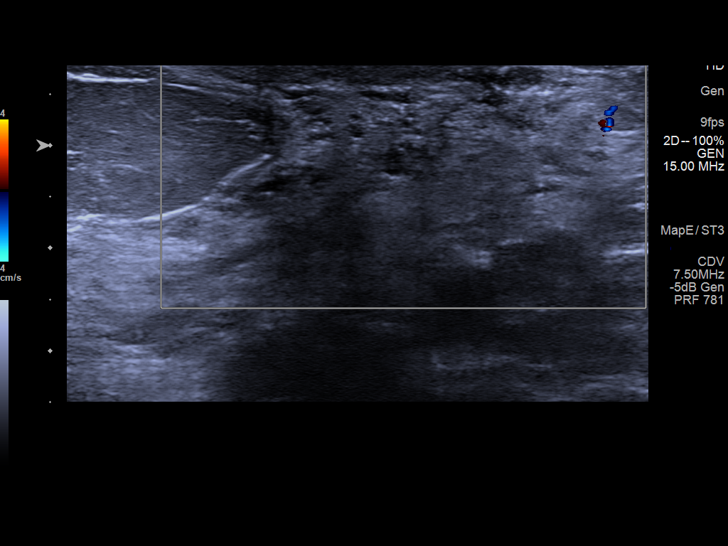
[im 12/16]
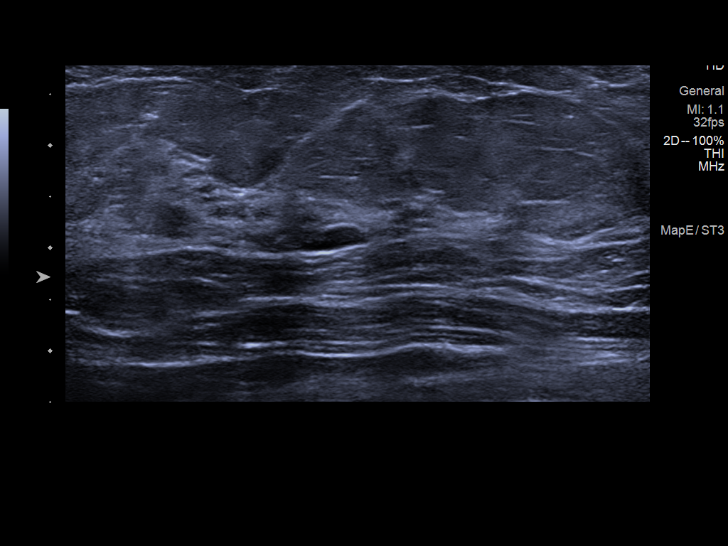
[im 13/16]
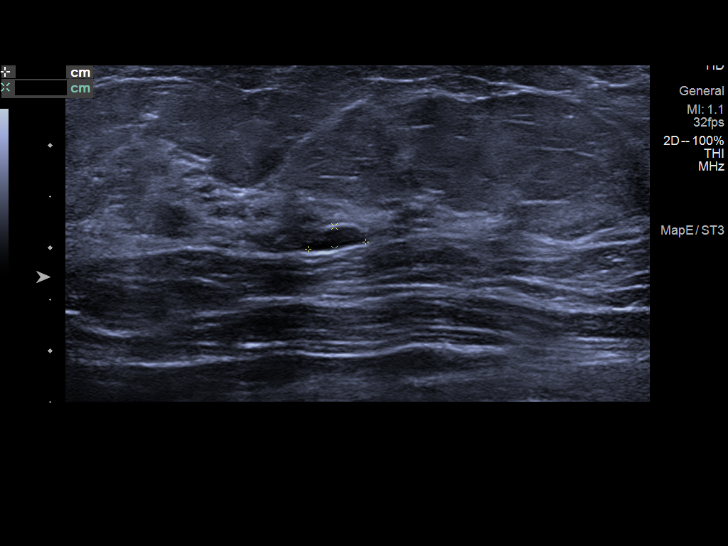
[im 15/16]
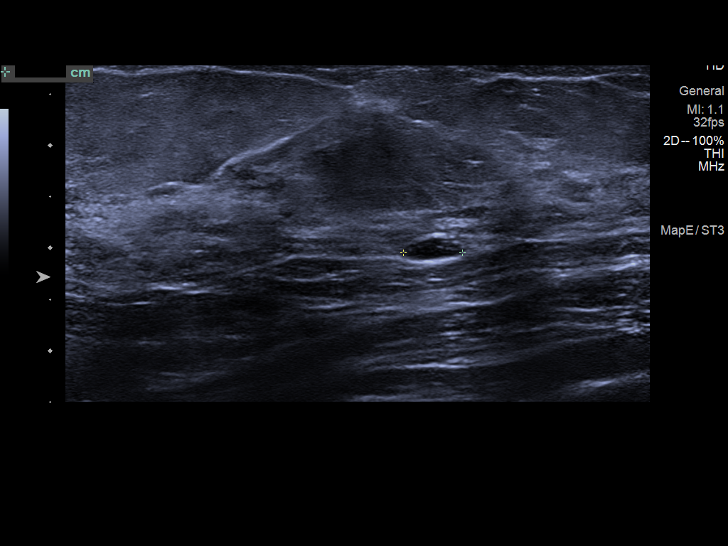
[im 16/16]
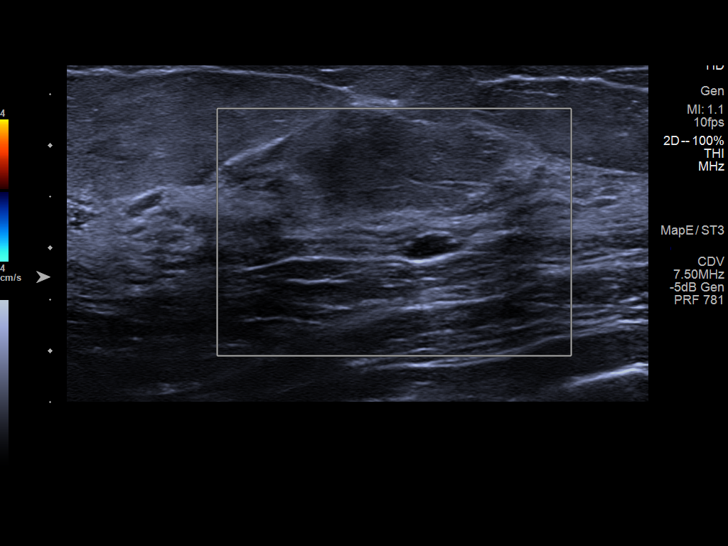

[13 of 16 positions shown; findings below may reference images not displayed]

ACR Breast Density Category c: The breast tissue is heterogeneously
dense, which may obscure small masses.
FINDINGS: Additional tomograms were performed of the bilateral breasts. There
is an oval circumscribed mass in the upper-outer left breast
measuring approximately 1 cm. The initially questioned asymmetry in
the right breast is felt to resolve on the additional images and
possibly related to an area of normal/dense fibroglandular tissue.

Mammographic images were processed with CAD.

Targeted ultrasound of the left breast was performed. There is a
cyst at 2 o'clock 4 cm from nipple measuring 1 x 0.6 by 1.2 cm. This
corresponds well with the mass seen in the left breast mammography.
Several areas of fibrocystic change are seen in the central and
outer left breast.

Targeted ultrasound of the superior right breast was performed
demonstrating several areas of fibrocystic change. A lobulated cyst
in the right breast at the [DATE] position 2 cm from nipple measures
0.6 x 0.2 x 0.6 cm. This is felt to correspond with the initially
questioned asymmetry seen in the right breast at mammography.
IMPRESSION: No findings of malignancy in either breast.

RECOMMENDATION:
Screening mammogram in one year.(Code:[EK])

I have discussed the findings and recommendations with the patient.
If applicable, a reminder letter will be sent to the patient
regarding the next appointment.

BI-RADS CATEGORY  2: Benign.

## 2020-05-19 IMAGING — US US BREAST*R* LIMITED INC AXILLA
1 series · 13 of 16 positions shown · non-contrast
Comparison: Previous exams.

CLINICAL DATA: Screening recall for right breast asymmetry and left
breast mass.

EXAM:
DIGITAL DIAGNOSTIC BILATERAL MAMMOGRAM WITH TOMO AND CAD; ULTRASOUND
LEFT BREAST LIMITED; ULTRASOUND RIGHT BREAST LIMITED

[Series 1: us breast*right* limited inc axilla · 0.07mm/px · 13 of 16 slices shown]
[im 1/16]
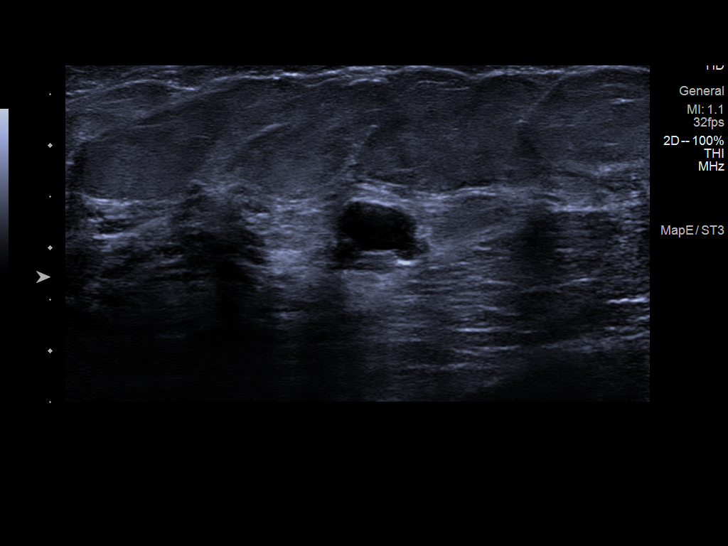
[im 2/16]
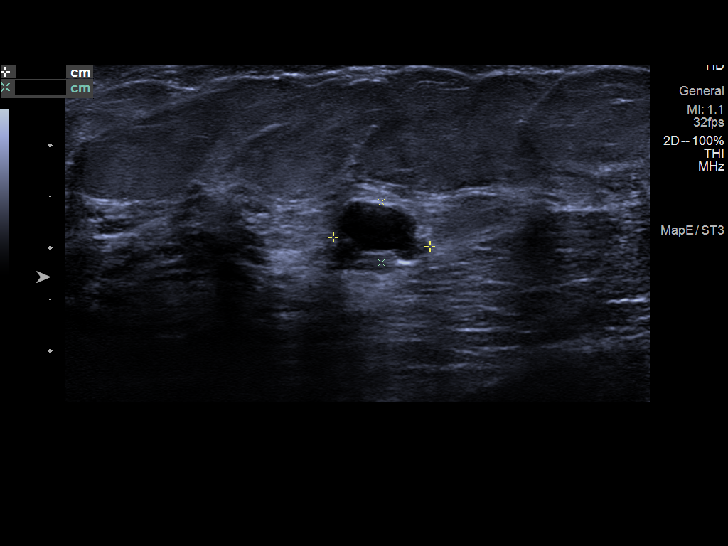
[im 4/16]
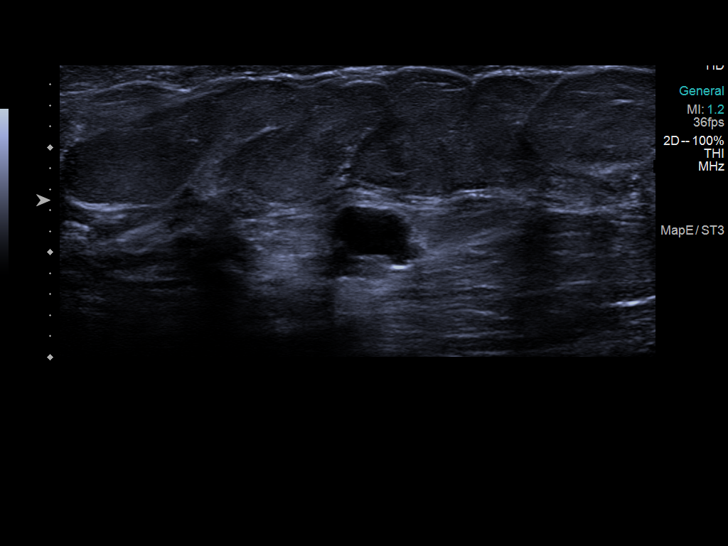
[im 5/16]
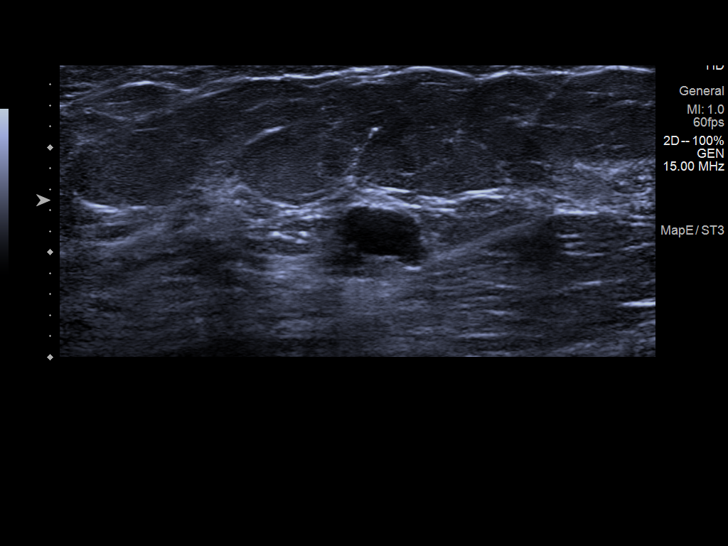
[im 6/16]
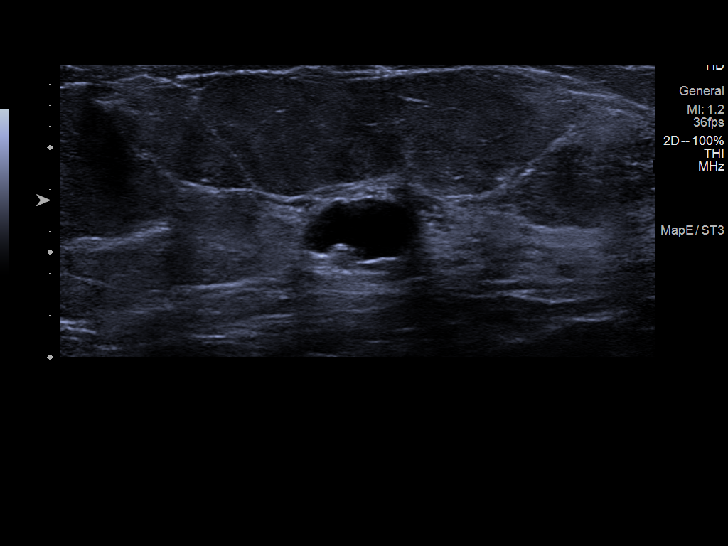
[im 7/16]
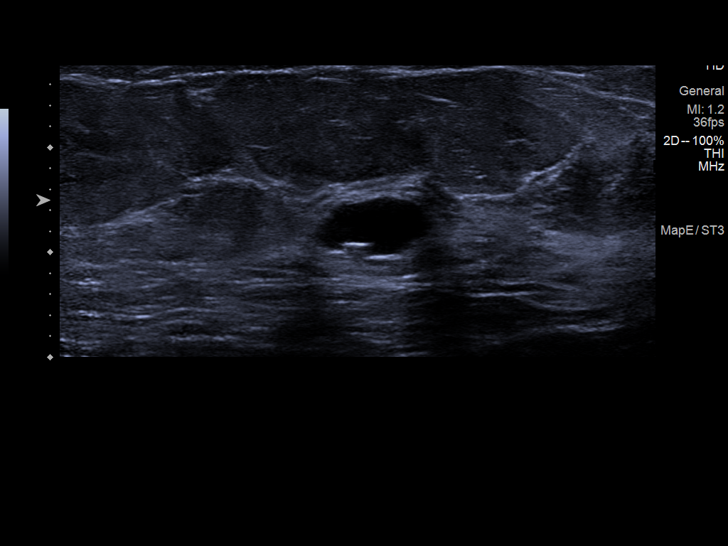
[im 9/16]
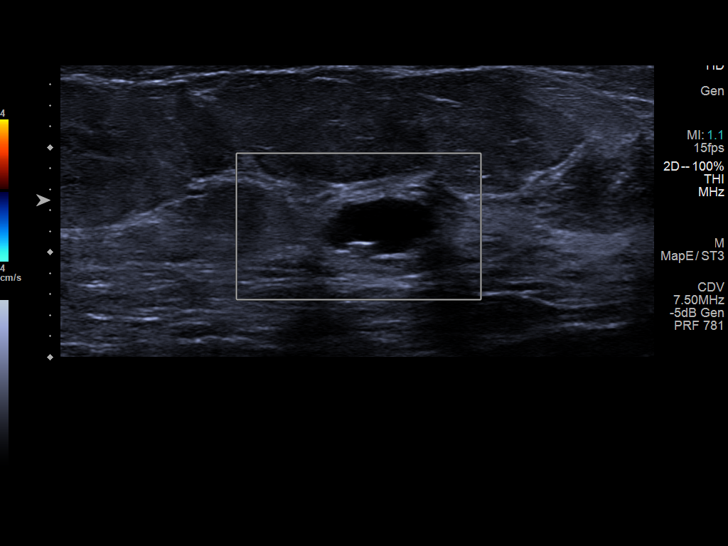
[im 10/16]
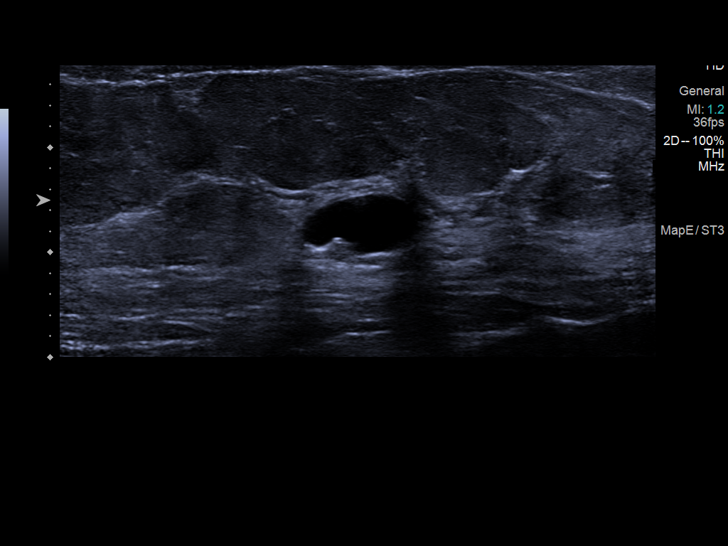
[im 11/16]
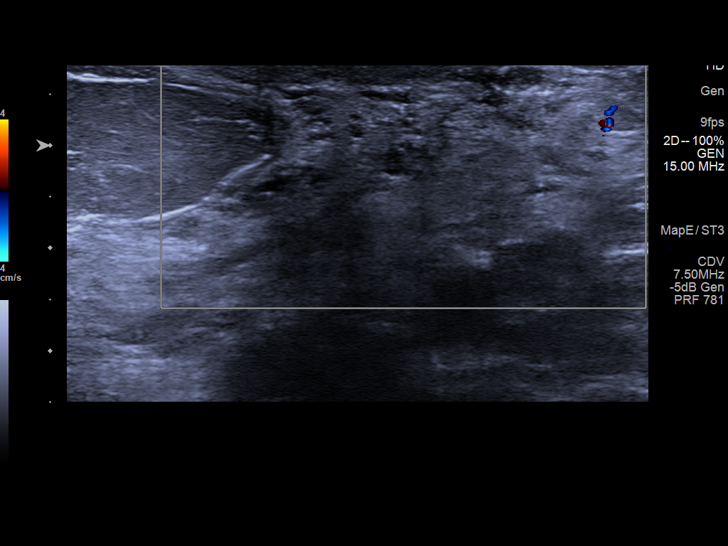
[im 12/16]
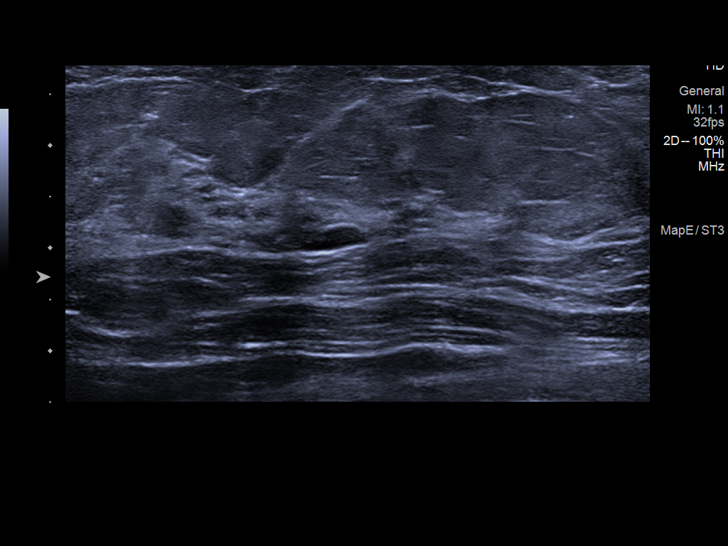
[im 13/16]
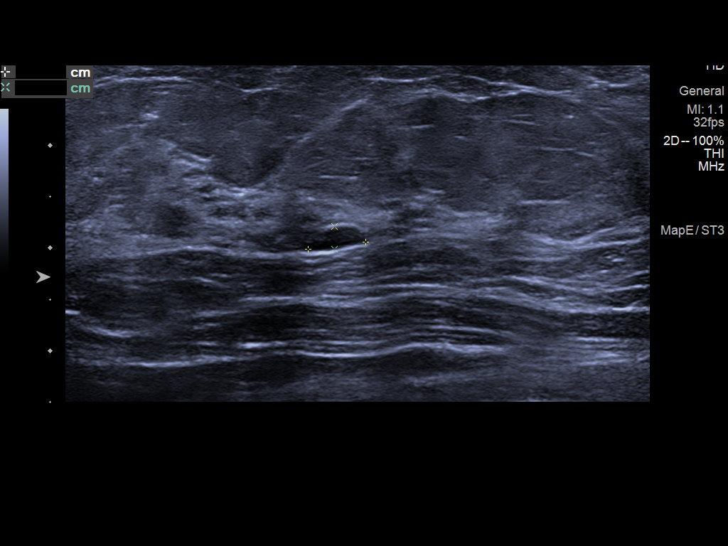
[im 15/16]
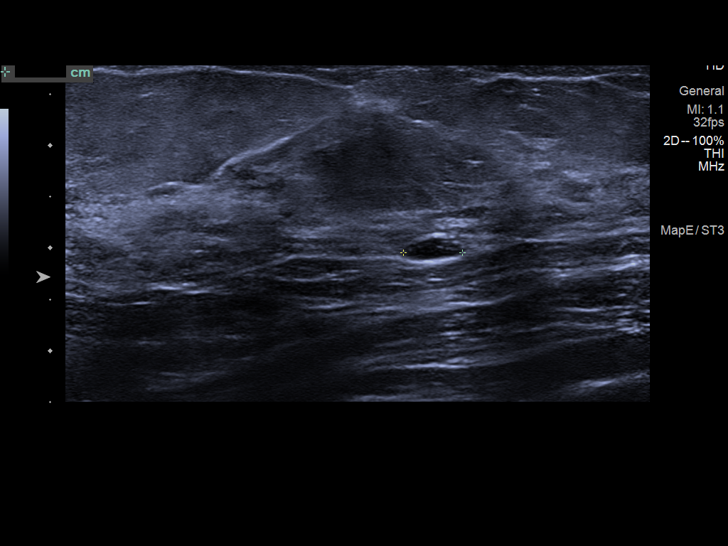
[im 16/16]
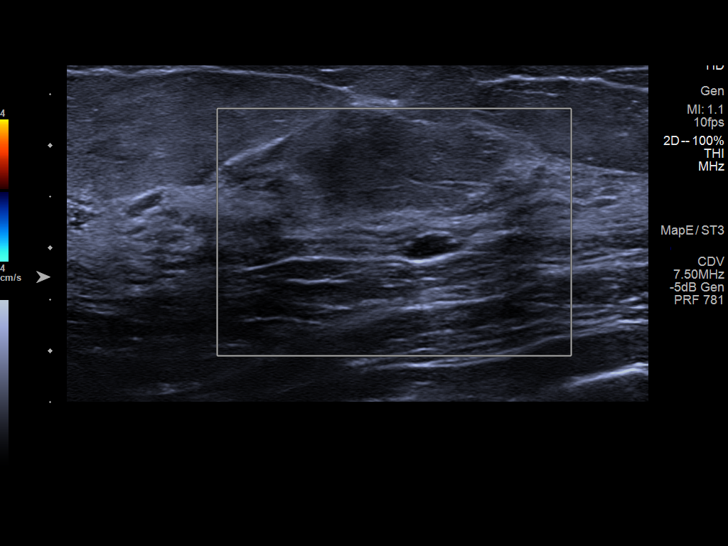

[13 of 16 positions shown; findings below may reference images not displayed]

ACR Breast Density Category c: The breast tissue is heterogeneously
dense, which may obscure small masses.
FINDINGS: Additional tomograms were performed of the bilateral breasts. There
is an oval circumscribed mass in the upper-outer left breast
measuring approximately 1 cm. The initially questioned asymmetry in
the right breast is felt to resolve on the additional images and
possibly related to an area of normal/dense fibroglandular tissue.

Mammographic images were processed with CAD.

Targeted ultrasound of the left breast was performed. There is a
cyst at 2 o'clock 4 cm from nipple measuring 1 x 0.6 by 1.2 cm. This
corresponds well with the mass seen in the left breast mammography.
Several areas of fibrocystic change are seen in the central and
outer left breast.

Targeted ultrasound of the superior right breast was performed
demonstrating several areas of fibrocystic change. A lobulated cyst
in the right breast at the [DATE] position 2 cm from nipple measures
0.6 x 0.2 x 0.6 cm. This is felt to correspond with the initially
questioned asymmetry seen in the right breast at mammography.
IMPRESSION: No findings of malignancy in either breast.

RECOMMENDATION:
Screening mammogram in one year.(Code:[EK])

I have discussed the findings and recommendations with the patient.
If applicable, a reminder letter will be sent to the patient
regarding the next appointment.

BI-RADS CATEGORY  2: Benign.

## 2020-05-24 ENCOUNTER — Telehealth: Payer: Self-pay | Admitting: Radiology

## 2020-05-24 NOTE — Telephone Encounter (Signed)
[id number] [employer] is her insurance now We got Biochemist, clinical from [employer]   [phone] is phone number I called spoke to [facility] and the reference number for the call is her name and todays date

## 2020-05-26 ENCOUNTER — Ambulatory Visit (HOSPITAL_COMMUNITY)
Admission: RE | Admit: 2020-05-26 | Discharge: 2020-05-26 | Disposition: A | Discharge status: Home / Self Care | Payer: BC Managed Care – PPO | Source: Ambulatory Visit | Attending: Orthopaedic Surgery | Admitting: Orthopaedic Surgery

## 2020-05-26 ENCOUNTER — Other Ambulatory Visit: Payer: Self-pay

## 2020-05-26 DIAGNOSIS — M25552 Pain in left hip: Secondary | ICD-10-CM | POA: Insufficient documentation

## 2020-05-26 DIAGNOSIS — G8929 Other chronic pain: Secondary | ICD-10-CM | POA: Insufficient documentation

## 2020-05-26 IMAGING — MR MR HIP*L* W/O CM
5 series · 40 of 40 positions shown · non-contrast
Comparison: Radiographs from [DATE]

CLINICAL DATA: Left hip pain and arthritis.

EXAM:
MR OF THE LEFT HIP WITHOUT CONTRAST
TECHNIQUE: Multiplanar, multisequence MR imaging was performed. No intravenous
contrast was administered.

[Series 8: T1 · coronal · left · 4.0mm · 0.78mm/px · 8 of 34 slices shown]
[im 1/34]
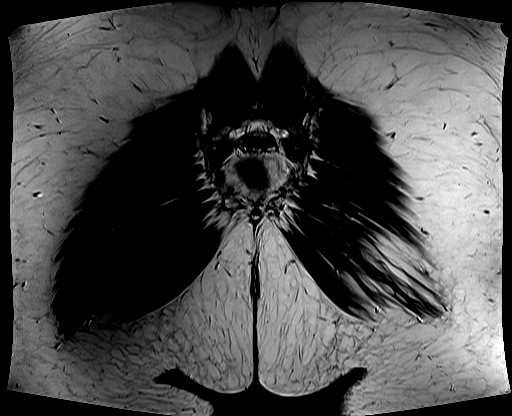
[im 5/34]
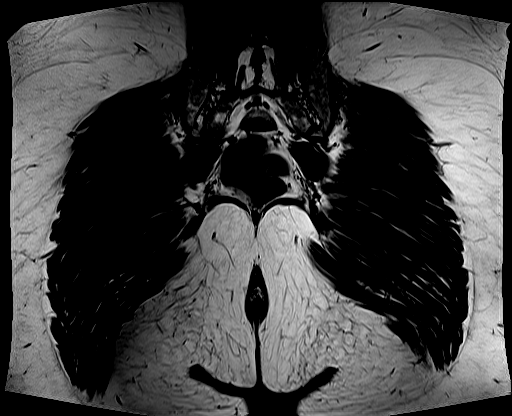
[im 10/34]
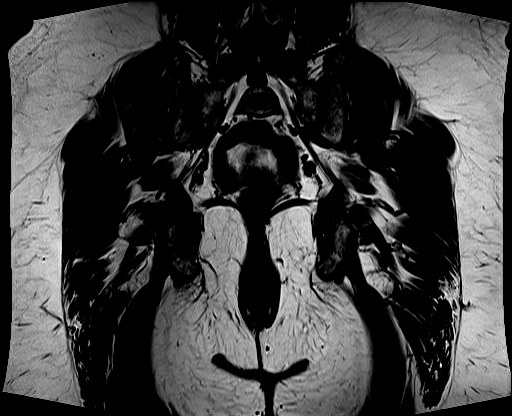
[im 15/34]
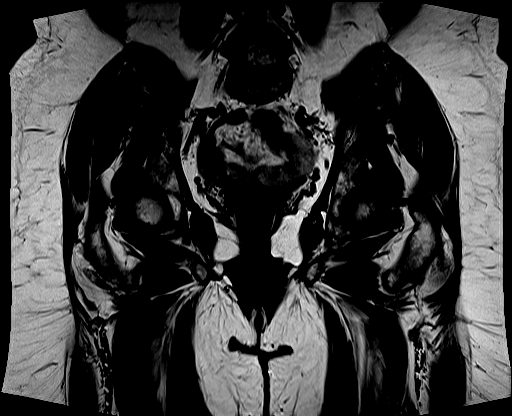
[im 19/34]
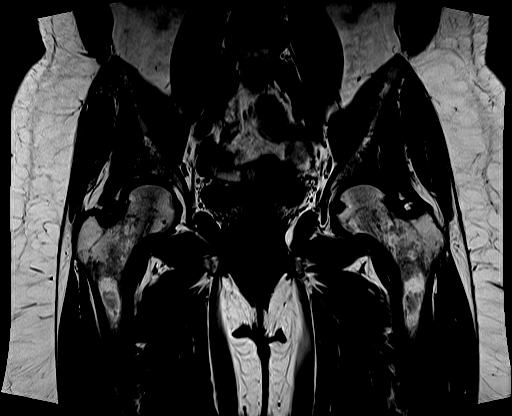
[im 24/34]
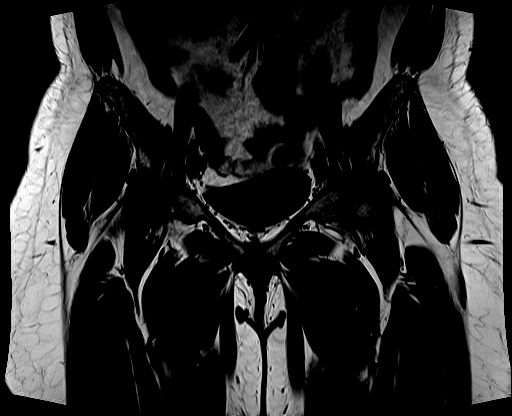
[im 29/34]
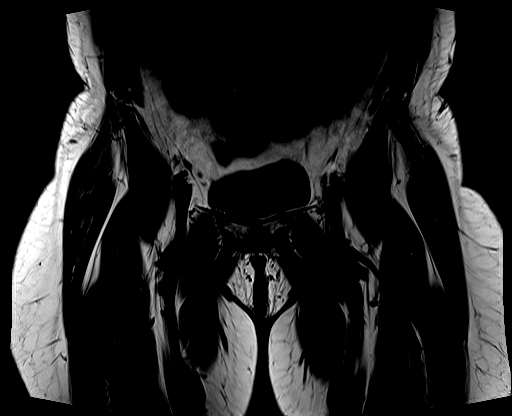
[im 34/34]
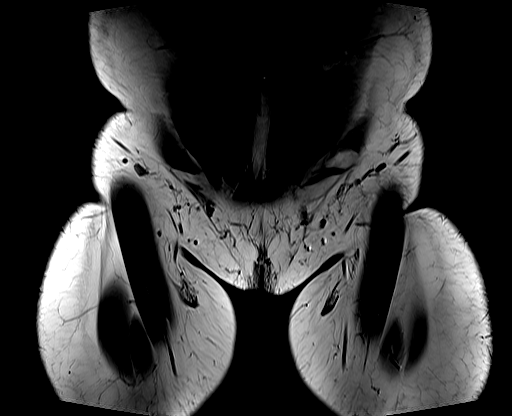

[Series 9: STIR · coronal · left · 4.0mm · 0.99mm/px · 8 of 34 slices shown]
[im 1/34]
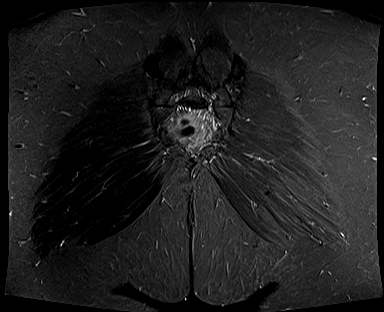
[im 5/34]
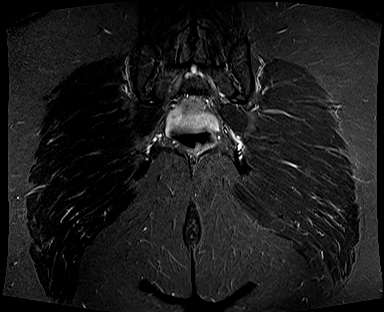
[im 10/34]
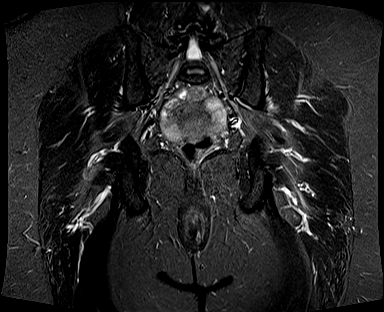
[im 15/34]
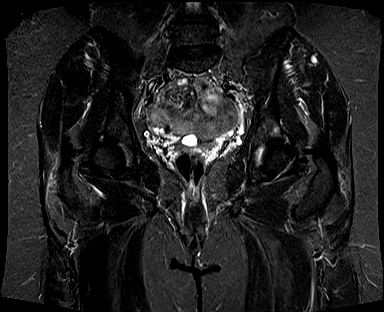
[im 19/34]
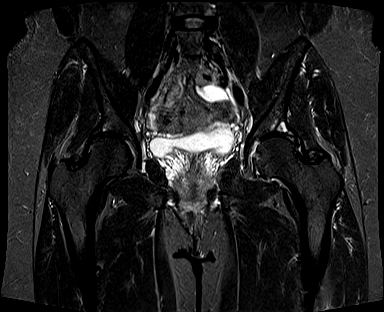
[im 24/34]
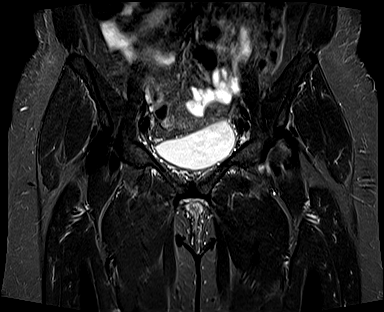
[im 29/34]
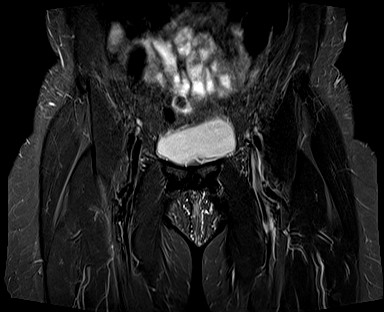
[im 34/34]
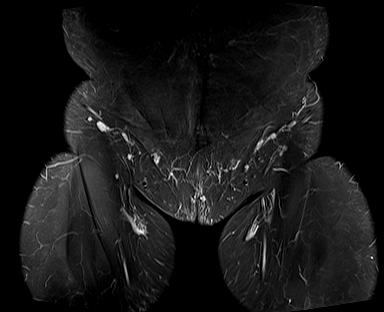

[Series 10: T2 fat-sat · axial · left · 4.0mm · 0.80mm/px · z∈[-93,+77]mm · 9 of 35 slices shown]
[im 1/35]
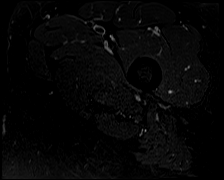
[im 5/35]
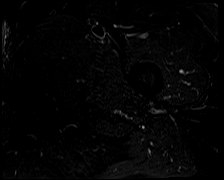
[im 9/35]
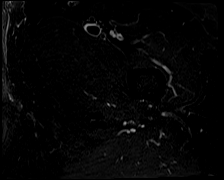
[im 13/35]
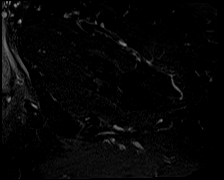
[im 18/35]
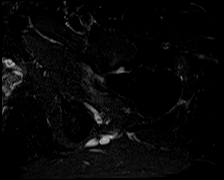
[im 22/35]
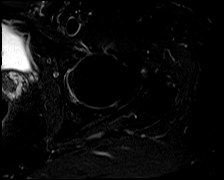
[im 26/35]
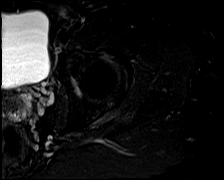
[im 30/35]
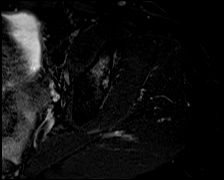
[im 35/35]
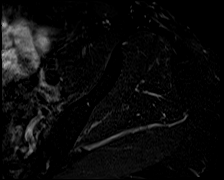

[Series 11: PD fat-sat · sagittal · left · 4.0mm · 0.70mm/px · 7 of 29 slices shown (1 of 2)]
[im 1/29]
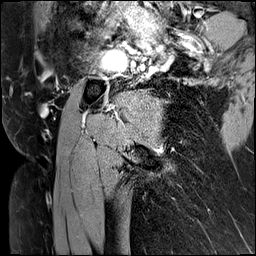
[im 5/29]
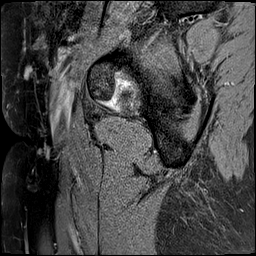
[im 10/29]
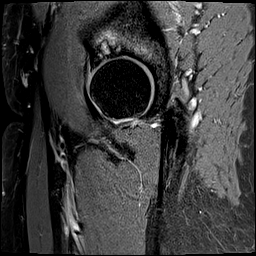
[im 15/29]
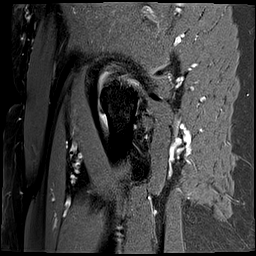
[im 19/29]
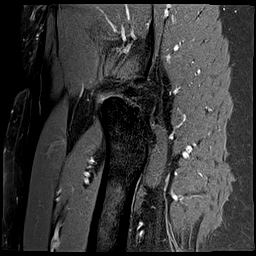
[im 24/29]
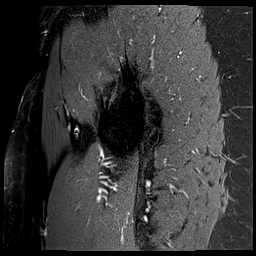
[im 29/29]
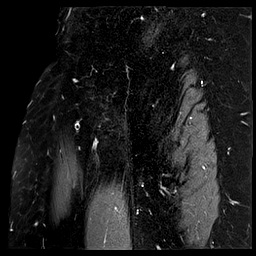

[Series 12: PD fat-sat · coronal · left · 4.0mm · 0.70mm/px · 8 of 31 slices shown (2 of 2)]
[im 1/31]
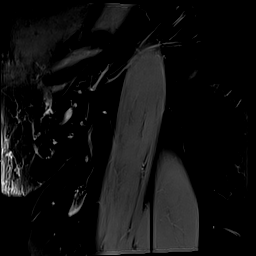
[im 5/31]
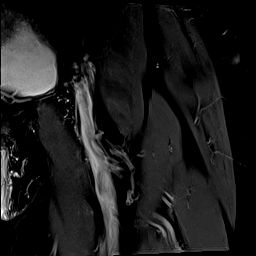
[im 9/31]
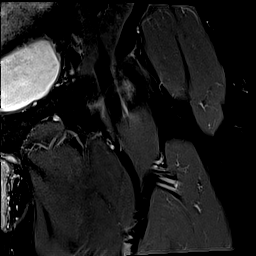
[im 13/31]
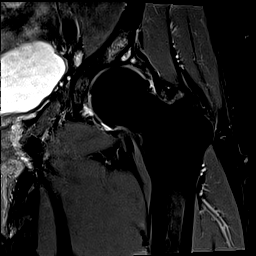
[im 18/31]
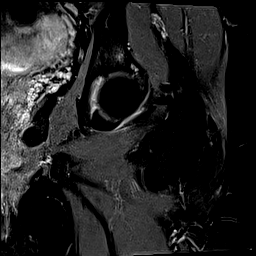
[im 22/31]
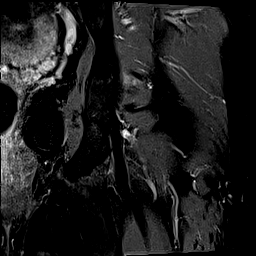
[im 26/31]
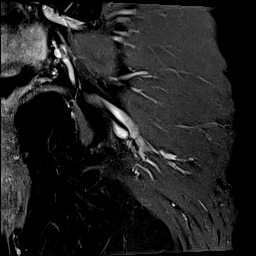
[im 31/31]
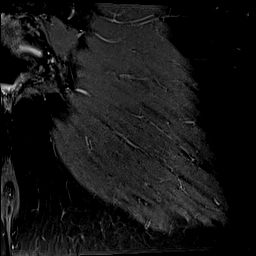

[40 of 40 positions shown; findings below may reference images not displayed]

FINDINGS: Bones: Moderate bilateral degenerative hip arthropathy with femoral
head and acetabular spurring and degenerative subcortical cyst
formation along the acetabula, especially along the left side. The
confluent degenerative subcortical cysts along the left acetabulum
measured 2.8 by 1.3 by 1.1 cm with associated sclerotic rim. There
is associated low-grade marrow edema in the left upper acetabulum
the pubis peers unremarkable aside from minimal spurring.
Degenerative disc disease at L5-S1.

Articular cartilage and labrum

Articular cartilage: Moderate degenerative chondral thinning in both
hips.

Labrum: Suspected degenerative tearing throughout the superior
labrum, with accentuated signal in the labrum for example on images
14 through 11 of series 12.

Joint or bursal effusion

Joint effusion:  Absent

Bursae: No regional bursitis.

Muscles and tendons

Muscles and tendons: Subtle edema posteriorly in the left iliopsoas
muscle adjacent to the joint on image 16 of series 10. The hamstring
tendons appear normal and symmetric.

Other findings

Miscellaneous: Prior hysterectomy, a 1.2 by 0.8 cm cystic lesion is
present along the vaginal cuff.
IMPRESSION: 1. Moderate bilateral degenerative hip arthropathy, especially along
the left side, with confluent degenerative subcortical cysts along
the left acetabulum.
2. Suspected degenerative tearing throughout the left acetabular
superior labrum.
3. Subtle edema posteriorly in the left iliopsoas muscle adjacent to
the joint, suspicious for mild strain.
4. Degenerative disc disease at L5-S1.
5. Prior hysterectomy. 1.2 by 0.8 cm cystic lesion along the vaginal
cuff.

## 2020-05-27 ENCOUNTER — Ambulatory Visit: Payer: 59 | Admitting: Orthopaedic Surgery

## 2020-06-03 ENCOUNTER — Ambulatory Visit: Payer: 59 | Admitting: Orthopaedic Surgery

## 2020-06-15 ENCOUNTER — Other Ambulatory Visit: Payer: Self-pay

## 2020-06-15 ENCOUNTER — Encounter: Payer: Self-pay | Admitting: Orthopaedic Surgery

## 2020-06-15 ENCOUNTER — Ambulatory Visit (INDEPENDENT_AMBULATORY_CARE_PROVIDER_SITE_OTHER): Payer: BC Managed Care – PPO | Admitting: Orthopaedic Surgery

## 2020-06-15 VITALS — BP 194/84 | HR 72 | Ht 63.0 in | Wt 179.0 lb

## 2020-06-15 DIAGNOSIS — M25552 Pain in left hip: Secondary | ICD-10-CM

## 2020-06-15 DIAGNOSIS — G8929 Other chronic pain: Secondary | ICD-10-CM

## 2020-06-15 NOTE — Progress Notes (Signed)
Patient ER:DEYCXK Diane Parker, female DOB:03-Feb-1956, 64 y.o. GYJ:[id number]  Chief Complaint  Patient presents with  . Hip Pain    Lt hip    HPI  Diane Parker is a 64 y.o. female who has left hip pain.  She had MRI which showed: IMPRESSION: 1. Moderate bilateral degenerative hip arthropathy, especially along the left side, with confluent degenerative subcortical cysts along the left acetabulum. 2. Suspected degenerative tearing throughout the left acetabular superior labrum. 3. Subtle edema posteriorly in the left iliopsoas muscle adjacent to the joint, suspicious for mild strain. 4. Degenerative disc disease at L5-S1. 5. Prior hysterectomy. 1.2 by 0.8 cm cystic lesion along the vaginal Cuff.  I have explained the findings to her.  I have recommended she see Dr. Aline Brochure for further evaluation of the hip.  I have independently reviewed the MRI.        Body mass index is 31.71 kg/m.  ROS  Review of Systems  Constitutional: Positive for activity change.  Musculoskeletal: Positive for arthralgias and gait problem.  All other systems reviewed and are negative.   All other systems reviewed and are negative.  The following is a summary of the past history medically, past history surgically, known current medicines, social history and family history.  This information is gathered electronically by the computer from prior information and documentation.  I review this each visit and have found including this information at this point in the chart is beneficial and informative.    Past Medical History:  Diagnosis Date  . Acute diverticulitis 03/28/2013   Seen on CT - treated with flagyl an cipro  . Arthritis    "shoulders, knees, hips" (03/29/2017)  . CAD (coronary artery disease), native coronary artery    03/29/17 PCI/DES to the mRCA, EF normal   . Essential hypertension   . GERD (gastroesophageal reflux disease)   . Headache    "bad ones lately; maybe q other day"  (03/29/2017)  . High cholesterol     Past Surgical History:  Procedure Laterality Date  . ABDOMINAL HYSTERECTOMY    . COLONOSCOPY  11/08/2007   YOV:[id number], rectosigmoid junction/hyperplastic-appearing polypoid    mucosa, 1 of these areas biopsied.  Otherwise, normal rectum/Left-sided transverse diverticula/Diminutive polyp just distal to the ileocecal valve. Hyperplastic and adenomatous polyp.   . COLONOSCOPY N/A 06/26/2013   OIN:OMVEHMC diverticulosis. Colonic polyps removed  . CORONARY ANGIOPLASTY WITH STENT PLACEMENT  03/29/2017  . CORONARY STENT INTERVENTION N/A 03/29/2017   Procedure: Coronary Stent Intervention;  Surgeon: Lorretta Harp, MD;  Location: [facility];  Parker: Cardiovascular;  Laterality: N/A;  . DILATION AND CURETTAGE OF UTERUS    . ESOPHAGOGASTRODUODENOSCOPY  2008   RMR: normal  . LEFT HEART CATH AND CORONARY ANGIOGRAPHY N/A 03/29/2017   Procedure: Left Heart Cath and Coronary Angiography;  Surgeon: Lorretta Harp, MD;  Location: [facility];  Parker: Cardiovascular;  Laterality: N/A;  . TUBAL LIGATION      Family History  Problem Relation Age of Onset  . Aneurysm Mother   . Heart disease Father   . Cancer Cousin        breast  . Colon cancer Neg Hx     Social History Social History   Tobacco Use  . Smoking status: Former Smoker    Packs/day: 0.12    Years: 3.00    Pack years: 0.36    Types: Cigarettes    Quit date: 1990    Years since quitting: 31.8  .  Smokeless tobacco: Never Used  Vaping Use  . Vaping Use: Never used  Substance Use Topics  . Alcohol use: Yes    Alcohol/week: 3.0 standard drinks    Types: 3 Cans of beer per week  . Drug use: Not Currently    Types: Marijuana    Comment: 03/29/2017 "smoked a little weed when I was young"    Allergies  Allergen Reactions  . Penicillins     Rash     Current Outpatient Medications  Medication Sig Dispense Refill  . amitriptyline (ELAVIL) 10 MG tablet Take 10 mg  by mouth at bedtime.     Marland Kitchen aspirin 81 MG chewable tablet Chew 1 tablet (81 mg total) by mouth daily. 30 tablet 0  . b complex vitamins tablet Take 1 tablet by mouth daily.    . carvedilol (COREG) 6.25 MG tablet Take 1 tablet (6.25 mg total) by mouth 2 (two) times daily. 180 tablet 1  . diclofenac sodium (VOLTAREN) 1 % GEL Apply topically 4 (four) times daily.    Marland Kitchen estradiol (ESTRACE) 2 MG tablet TAKE ONE TABLET BY MOUTH EVERY DAY 30 tablet 6  . ferrous sulfate 325 (65 FE) MG EC tablet Take 325 mg by mouth daily with breakfast.    . hydrALAZINE (APRESOLINE) 25 MG tablet Take 25 mg by mouth 3 (three) times daily.    . hydrochlorothiazide (HYDRODIURIL) 25 MG tablet Take 25 mg by mouth daily.    . hydrOXYzine (ATARAX/VISTARIL) 10 MG tablet Take 10 mg by mouth 3 (three) times daily as needed.    . montelukast (SINGULAIR) 10 MG tablet Take 10 mg by mouth at bedtime.     . Multiple Vitamin (MULTIVITAMIN WITH MINERALS) TABS tablet Take 1 tablet by mouth daily.    . Multiple Vitamins-Minerals (HAIR/SKIN/NAILS PO) Take 1 tablet by mouth daily.     . nitroGLYCERIN (NITROSTAT) 0.4 MG SL tablet Place 1 tablet (0.4 mg total) under the tongue every 5 (five) minutes x 3 doses as needed. 25 tablet 3  . pantoprazole (PROTONIX) 20 MG tablet Take 20 mg by mouth daily.    . polyethylene glycol-electrolytes (TRILYTE) 420 G solution Take 4,000 mLs by mouth as directed. 4000 mL 0  . potassium chloride (KLOR-CON) 20 MEQ packet Take 20 mEq by mouth daily.    . rosuvastatin (CRESTOR) 40 MG tablet Take 1 tablet (40 mg total) by mouth daily. 90 tablet 3  . sertraline (ZOLOFT) 25 MG tablet Take 25 mg by mouth daily as needed (Hot Flashes).    . traMADol (ULTRAM) 50 MG tablet Take 50 mg by mouth as needed for pain.    Marland Kitchen UNABLE TO FIND ASA 500MG  AND CAFFEINE  32.5MG  AS NEEDED     . Zinc Sulfate (ZINC 15 PO) Take by mouth.      No current facility-administered medications for this visit.     Physical Exam  Blood  pressure (!) 194/84, pulse 72, height 5\' 3"  (1.6 m), weight 179 lb (81.2 kg).  Constitutional: overall normal hygiene, normal nutrition, well developed, normal grooming, normal body habitus. Assistive device:none  Musculoskeletal: gait and station Limp left, muscle tone and strength are normal, no tremors or atrophy is present.  .  Neurological: coordination overall normal.  Deep tendon reflex/nerve stretch intact.  Sensation normal.  Cranial nerves II-XII intact.   Skin:   Normal overall no scars, lesions, ulcers or rashes. No psoriasis.  Psychiatric: Alert and oriented x 3.  Recent memory intact, remote memory unclear.  Normal mood and affect. Well groomed.  Good eye contact.  Cardiovascular: overall no swelling, no varicosities, no edema bilaterally, normal temperatures of the legs and arms, no clubbing, cyanosis and good capillary refill.  Lymphatic: palpation is normal.  Left hip has painful ROM but good motion.  Slight limp left.  NV intact.  All other systems reviewed and are negative   The patient has been educated about the nature of the problem(s) and counseled on treatment options.  The patient appeared to understand what I have discussed and is in agreement with it.  Encounter Diagnosis  Name Primary?  . Chronic left hip pain Yes    PLAN Call if any problems.  Precautions discussed.  Continue current medications.   Return to clinic to see Dr. Aline Brochure.   Electronically Signed Sanjuana Kava, MD 10/12/20218:45 AM

## 2020-06-16 NOTE — Progress Notes (Signed)
Cardiology Office Note  Date: 06/17/2020   ID: JALEIGHA DEANE, DOB Jun 28, 1956, MRN [id number]  PCP:  Redmond School, MD  Cardiologist:  No primary care provider on file. Electrophysiologist:  None   Chief Complaint: Follow-up CAD  History of Present Illness: Diane Parker is a 64 y.o. female with a history of CAD (RCA stent placement for mid RCA 99% stenosis 03/29/2017, also stenosis of 90% and ostium of first diagonal branch), HTN, HLD, palpitations.  Last encounter with Dr. Bronson Ing on 08/26/2019.  She had previously been complaining of palpitations.  Event monitor demonstrated sinus rhythm without arrhythmia.  Echo demonstrated normal LV systolic function.  She has been exercising and losing weight.  She denied any chest pain.  Occasional palpitations.  She was continuing aspirin, carvedilol, and Crestor.  Carvedilol was increased to 6.25 mg due to elevated blood pressures.  She was continuing Crestor.  Copy of recent lipids from PCP was being obtained.  Event monitor demonstrated sinus rhythm with no arrhythmias.  He is here for 34-month follow-up today.  States she has been having a lot of arthritic pain in her neck and right arm sometimes into her chest.  Had one episode few weeks ago and did take 3 nitroglycerin with eventual relief.  She denied any associated nausea, vomiting, or diaphoresis.  States he has palpitations mostly in the evening when trying to go to bed.  States she has taken nitroglycerin in the past see if this would relieve the palpitations.  She denies any recent anginal or exertional symptoms, orthostatic symptoms, CVA or TIA-like symptoms, PND, orthopnea, bleeding, claudication, DVT or PE-like symptoms, or lower extremity edema.   Past Medical History:  Diagnosis Date  . Acute diverticulitis 03/28/2013   Seen on CT - treated with flagyl an cipro  . Arthritis    "shoulders, knees, hips" (03/29/2017)  . CAD (coronary artery disease), native coronary artery     03/29/17 PCI/DES to the mRCA, EF normal   . Essential hypertension   . GERD (gastroesophageal reflux disease)   . Headache    "bad ones lately; maybe q other day" (03/29/2017)  . High cholesterol     Past Surgical History:  Procedure Laterality Date  . ABDOMINAL HYSTERECTOMY    . COLONOSCOPY  11/08/2007   RCB:[id number], rectosigmoid junction/hyperplastic-appearing polypoid    mucosa, 1 of these areas biopsied.  Otherwise, normal rectum/Left-sided transverse diverticula/Diminutive polyp just distal to the ileocecal valve. Hyperplastic and adenomatous polyp.   . COLONOSCOPY N/A 06/26/2013   OZY:[id number] diverticulosis. Colonic polyps removed  . CORONARY ANGIOPLASTY WITH STENT PLACEMENT  03/29/2017  . CORONARY STENT INTERVENTION N/A 03/29/2017   Procedure: Coronary Stent Intervention;  Surgeon: Lorretta Harp, MD;  Location: [facility];  Service: Cardiovascular;  Laterality: N/A;  . DILATION AND CURETTAGE OF UTERUS    . ESOPHAGOGASTRODUODENOSCOPY  2008   RMR: normal  . LEFT HEART CATH AND CORONARY ANGIOGRAPHY N/A 03/29/2017   Procedure: Left Heart Cath and Coronary Angiography;  Surgeon: Lorretta Harp, MD;  Location: [facility];  Service: Cardiovascular;  Laterality: N/A;  . TUBAL LIGATION      Current Outpatient Medications  Medication Sig Dispense Refill  . aspirin 81 MG chewable tablet Chew 1 tablet (81 mg total) by mouth daily. 30 tablet 0  . b complex vitamins tablet Take 1 tablet by mouth daily.    . carvedilol (COREG) 6.25 MG tablet Take 1 tablet (6.25 mg total) by mouth 2 (two) times  daily. 180 tablet 1  . diclofenac sodium (VOLTAREN) 1 % GEL Apply topically 4 (four) times daily.    Marland Kitchen estradiol (ESTRACE) 2 MG tablet TAKE ONE TABLET BY MOUTH EVERY DAY 30 tablet 6  . ferrous sulfate 325 (65 FE) MG EC tablet Take 325 mg by mouth daily with breakfast.    . hydrALAZINE (APRESOLINE) 25 MG tablet Take 25 mg by mouth 3 (three) times daily.    .  hydrochlorothiazide (HYDRODIURIL) 25 MG tablet Take 25 mg by mouth daily.    . hydrOXYzine (ATARAX/VISTARIL) 10 MG tablet Take 10 mg by mouth 3 (three) times daily as needed.    . montelukast (SINGULAIR) 10 MG tablet Take 10 mg by mouth at bedtime.     . Multiple Vitamin (MULTIVITAMIN WITH MINERALS) TABS tablet Take 1 tablet by mouth daily.    . Multiple Vitamins-Minerals (HAIR/SKIN/NAILS PO) Take 1 tablet by mouth daily.     . nitroGLYCERIN (NITROSTAT) 0.4 MG SL tablet Place 1 tablet (0.4 mg total) under the tongue every 5 (five) minutes x 3 doses as needed. 25 tablet 3  . pantoprazole (PROTONIX) 20 MG tablet Take 20 mg by mouth daily.    . potassium chloride (KLOR-CON) 20 MEQ packet Take 20 mEq by mouth daily.    . rosuvastatin (CRESTOR) 40 MG tablet Take 1 tablet (40 mg total) by mouth daily. 90 tablet 3  . sertraline (ZOLOFT) 25 MG tablet Take 25 mg by mouth daily as needed (Hot Flashes).    . traMADol (ULTRAM) 50 MG tablet Take 50 mg by mouth as needed for pain.    Marland Kitchen UNABLE TO FIND ASA 500MG  AND CAFFEINE  32.5MG  AS NEEDED     . Zinc Sulfate (ZINC 15 PO) Take by mouth.      No current facility-administered medications for this visit.   Allergies:  Penicillins   Social History: The patient  reports that she quit smoking about 31 years ago. Her smoking use included cigarettes. She has a 0.36 pack-year smoking history. She has never used smokeless tobacco. She reports current alcohol use of about 3.0 standard drinks of alcohol per week. She reports previous drug use. Drug: Marijuana.   Family History: The patient's family history includes Aneurysm in her mother; Cancer in her cousin; Heart disease in her father.   ROS:  Please see the history of present illness. Otherwise, complete review of systems is positive for none.  All other systems are reviewed and negative.   Physical Exam: VS:  BP (!) 142/78   Pulse 67   Ht 5\' 3"  (1.6 m)   Wt 176 lb 3.2 oz (79.9 kg)   SpO2 96%   BMI 31.21  kg/m , BMI Body mass index is 31.21 kg/m.  Wt Readings from Last 3 Encounters:  06/17/20 176 lb 3.2 oz (79.9 kg)  06/15/20 179 lb (81.2 kg)  05/13/20 180 lb (81.6 kg)    General: Patient appears comfortable at rest. Neck: Supple, no elevated JVP or carotid bruits, no thyromegaly. Lungs: Clear to auscultation, nonlabored breathing at rest. Cardiac: Regular rate and rhythm, no S3 or significant systolic murmur, no pericardial rub. Extremities: No pitting edema, distal pulses 2+. Skin: Warm and dry. Musculoskeletal: No kyphosis. Neuropsychiatric: Alert and oriented x3, affect grossly appropriate.  ECG:  An ECG dated 06/17/2020 was personally reviewed today and demonstrated:  Sinus rhythm rate of 61 diffuse nonspecific T abnormality  Recent Labwork: No results found for requested labs within last 8760 hours.  No results  found for: CHOL, TRIG, HDL, CHOLHDL, VLDL, LDLCALC, LDLDIRECT  Other Studies Reviewed Today:   Cardiac catheterization 03/29/2017:   Prox LAD lesion, 40 %stenosed.  Ost 1st Diag lesion, 95 %stenosed.  Mid RCA lesion, 99 %stenosed.  Post intervention, there is a 0% residual stenosis.  A stent was successfully placed.  The left ventricular systolic function is normal.  LV end diastolic pressure is normal.  The left ventricular ejection fraction is 55-65% by visual estimate.   Echocardiogram 07/02/2019:  1. Left ventricular ejection fraction, by visual estimation, is 60 to 65%. The left ventricle has normal function. Normal left ventricular size. There is borderline left ventricular hypertrophy. 2. Left ventricular diastolic Doppler parameters are consistent with impaired relaxation pattern of LV diastolic filling. 3. Global right ventricle has normal systolic function.The right ventricular size is normal. No increase in right ventricular wall thickness. 4. Left atrial size was normal. 5. Right atrial size was normal. 6. The mitral valve is  grossly normal. Mild mitral valve regurgitation. 7. The tricuspid valve is grossly normal. Tricuspid valve regurgitation is trivial. 8. The aortic valve is tricuspid Aortic valve regurgitation is trivial by color flow Doppler. Mild aortic valve sclerosis without stenosis. 9. The pulmonic valve was grossly normal. Pulmonic valve regurgitation is not visualized by color flow Doppler. 10. TR signal is inadequate for assessing pulmonary artery systolic pressure. 11. The inferior vena cava is normal in size with greater than 50% respiratory variability, suggesting right atrial pressure of 3 mmHg.   Assessment and Plan:  1. CAD in native artery   2. Essential hypertension   3. Hyperlipidemia with target low density lipoprotein (LDL) cholesterol less than 70 mg/dL   4. Palpitations    1. CAD in native artery Denies any classic anginal symptoms with exertion.  States she has had some neck pain with the right arm pain but states she does have significant arthritis.  Had one episode of pain which seemed to radiate from her neck into her chest and took 3 nitroglycerin with eventual relief.  She has had no further episode.  Denies any associated symptoms.  Advised if symptoms become more persistent, longer lasting, more frequent in spite of nitroglycerin to call EMS if she thinks this may be her heart.  Continue sublingual nitroglycerin as needed.  Continue aspirin 81 mg.  2. Essential hypertension Blood pressure initially elevated at 142/78.  Recheck in right arm was 130/70.  States she has been having a lot of arthritic-like pain.  She is working 10 hours per night and has not slept yet today.  Continue carvedilol 6.25 mg p.o. twice daily.  Continue hydralazine 25 mg p.o. 3 times daily.  Continue HCTZ 25 mg daily.  3. Hyperlipidemia with target low density lipoprotein (LDL) cholesterol less than 70 mg/dL Patient states she had recent labs at PCP and was told all of her lab work was within normal  limits.  Continue Crestor 40 mg p.o. daily.  4. Palpitations Patient states she is noticing palpitations mostly in the evening when going to bed.  States they do not occur every night but they can be bothersome.  Advised her to purchase a pulse oximeter and measure her heart rate when she feels these palpitations.  I am giving her Cardizem 30 mg p.o. as needed for palpitations if she has a sustained heart rate above 100 on pulse oximetry.  EKG today shows sinus rhythm rate of 61.  Medication Adjustments/Labs and Tests Ordered: Current medicines are reviewed at length with  the patient today.  Concerns regarding medicines are outlined above.   Disposition: Follow-up with   Signed, Levell July, NP 06/17/2020 10:59 AM    [facility], [place], [place] [address] Phone: [phone]; Fax: [phone]

## 2020-06-17 ENCOUNTER — Encounter: Payer: Self-pay | Admitting: Family Medicine

## 2020-06-17 ENCOUNTER — Other Ambulatory Visit: Payer: Self-pay

## 2020-06-17 ENCOUNTER — Ambulatory Visit (INDEPENDENT_AMBULATORY_CARE_PROVIDER_SITE_OTHER): Payer: BC Managed Care – PPO | Admitting: Family Medicine

## 2020-06-17 ENCOUNTER — Telehealth: Payer: Self-pay | Admitting: Orthopedic Surgery

## 2020-06-17 VITALS — BP 142/78 | HR 67 | Ht 63.0 in | Wt 176.2 lb

## 2020-06-17 DIAGNOSIS — E785 Hyperlipidemia, unspecified: Secondary | ICD-10-CM | POA: Diagnosis not present

## 2020-06-17 DIAGNOSIS — I2510 Atherosclerotic heart disease of native coronary artery without angina pectoris: Secondary | ICD-10-CM

## 2020-06-17 DIAGNOSIS — R002 Palpitations: Secondary | ICD-10-CM

## 2020-06-17 DIAGNOSIS — I10 Essential (primary) hypertension: Secondary | ICD-10-CM | POA: Diagnosis not present

## 2020-06-17 MED ORDER — DILTIAZEM HCL 30 MG PO TABS: 30.0000 mg | ORAL_TABLET | ORAL | 2 refills | Status: DC | PRN

## 2020-06-17 NOTE — Patient Instructions (Signed)
Medication Instructions:   Begin Cardizem 30mg  as needed for palpitations.   Continue all other medications.    Labwork: none  Testing/Procedures: none  Follow-Up: 6 months   Any Other Special Instructions Will Be Listed Below (If Applicable).  If you need a refill on your cardiac medications before your next appointment, please call your pharmacy.

## 2020-06-17 NOTE — Telephone Encounter (Signed)
Called patient regarding consult appointment with Dr Aline Brochure; left message to return call.

## 2020-06-17 NOTE — Telephone Encounter (Signed)
Patient returned call. Appointment with Dr Aline Brochure offered. Patient relays she would like to think about it. Appointment pending. Aware we may also send a reminder letter.

## 2020-06-18 ENCOUNTER — Encounter: Payer: Self-pay | Admitting: Orthopedic Surgery

## 2020-07-21 ENCOUNTER — Encounter: Payer: Self-pay | Admitting: Orthopedic Surgery

## 2020-07-21 ENCOUNTER — Other Ambulatory Visit: Payer: Self-pay

## 2020-07-21 ENCOUNTER — Ambulatory Visit (INDEPENDENT_AMBULATORY_CARE_PROVIDER_SITE_OTHER): Payer: Medicare Other | Admitting: Orthopedic Surgery

## 2020-07-21 VITALS — BP 201/103 | HR 68 | Ht 63.0 in | Wt 175.0 lb

## 2020-07-21 DIAGNOSIS — G8929 Other chronic pain: Secondary | ICD-10-CM | POA: Diagnosis not present

## 2020-07-21 DIAGNOSIS — M25552 Pain in left hip: Secondary | ICD-10-CM | POA: Diagnosis not present

## 2020-07-21 DIAGNOSIS — M5136 Other intervertebral disc degeneration, lumbar region: Secondary | ICD-10-CM | POA: Diagnosis not present

## 2020-07-21 MED ORDER — METHYLPREDNISOLONE ACETATE 40 MG/ML IJ SUSP
40.0000 mg | Freq: Once | INTRAMUSCULAR | Status: AC
  Administered 2020-07-21: 40 mg via INTRAMUSCULAR

## 2020-07-21 NOTE — Progress Notes (Signed)
[place]  Chief Complaint  Patient presents with  . Hip Pain    Left hip pain getting worse over past 6-7 months    64 year old female presents for possible left total hip  Patient reports pain in her left groin greater trochanter and lower back without radiation posteriorly or anteriorly towards the knee pain seems to all localize into the left groin.  Patient reports increased pain when getting out of a chair bending over and difficulty with activities of daily living such as getting dressed and putting on socks  However, she does not want to have hip replacement at this time she is asking for a shot   Review of Systems  Musculoskeletal: Positive for back pain.  Neurological: Negative for tingling, focal weakness and weakness.     Past Medical History:  Diagnosis Date  . Acute diverticulitis 03/28/2013   Seen on CT - treated with flagyl an cipro  . Arthritis    "shoulders, knees, hips" (03/29/2017)  . CAD (coronary artery disease), native coronary artery    03/29/17 PCI/DES to the mRCA, EF normal   . Essential hypertension   . GERD (gastroesophageal reflux disease)   . Headache    "bad ones lately; maybe q other day" (03/29/2017)  . High cholesterol     Past Surgical History:  Procedure Laterality Date  . ABDOMINAL HYSTERECTOMY    . COLONOSCOPY  11/08/2007   ION:[id number], rectosigmoid junction/hyperplastic-appearing polypoid    mucosa, 1 of these areas biopsied.  Otherwise, normal rectum/Left-sided transverse diverticula/Diminutive polyp just distal to the ileocecal valve. Hyperplastic and adenomatous polyp.   . COLONOSCOPY N/A 06/26/2013   WNU:[id number] diverticulosis. Colonic polyps removed  . CORONARY ANGIOPLASTY WITH STENT PLACEMENT  03/29/2017  . CORONARY STENT INTERVENTION N/A 03/29/2017   Procedure: Coronary Stent Intervention;  Surgeon: Lorretta Harp, MD;  Location: [facility];  Service: Cardiovascular;  Laterality: N/A;  . DILATION AND CURETTAGE  OF UTERUS    . ESOPHAGOGASTRODUODENOSCOPY  2008   RMR: normal  . LEFT HEART CATH AND CORONARY ANGIOGRAPHY N/A 03/29/2017   Procedure: Left Heart Cath and Coronary Angiography;  Surgeon: Lorretta Harp, MD;  Location: [facility];  Service: Cardiovascular;  Laterality: N/A;  . TUBAL LIGATION      Family History  Problem Relation Age of Onset  . Aneurysm Mother   . Heart disease Father   . Cancer Cousin        breast  . Colon cancer Neg Hx    Social History   Tobacco Use  . Smoking status: Former Smoker    Packs/day: 0.12    Years: 3.00    Pack years: 0.36    Types: Cigarettes    Quit date: 1990    Years since quitting: 31.8  . Smokeless tobacco: Never Used  Vaping Use  . Vaping Use: Never used  Substance Use Topics  . Alcohol use: Yes    Alcohol/week: 3.0 standard drinks    Types: 3 Cans of beer per week  . Drug use: Not Currently    Types: Marijuana    Comment: 03/29/2017 "smoked a little weed when I was young"    Allergies  Allergen Reactions  . Penicillins     Rash      Current Meds  Medication Sig  . aspirin 81 MG chewable tablet Chew 1 tablet (81 mg total) by mouth daily.  Marland Kitchen b complex vitamins tablet Take 1 tablet by mouth daily.  . carvedilol (COREG) 6.25  MG tablet Take 1 tablet (6.25 mg total) by mouth 2 (two) times daily.  . diclofenac sodium (VOLTAREN) 1 % GEL Apply topically 4 (four) times daily.  Marland Kitchen diltiazem (CARDIZEM) 30 MG tablet Take 1 tablet (30 mg total) by mouth as needed (palpitations).  Marland Kitchen estradiol (ESTRACE) 2 MG tablet TAKE ONE TABLET BY MOUTH EVERY DAY  . ferrous sulfate 325 (65 FE) MG EC tablet Take 325 mg by mouth daily with breakfast.  . hydrALAZINE (APRESOLINE) 25 MG tablet Take 25 mg by mouth 3 (three) times daily.  . hydrochlorothiazide (HYDRODIURIL) 25 MG tablet Take 25 mg by mouth daily.  . hydrOXYzine (ATARAX/VISTARIL) 10 MG tablet Take 10 mg by mouth 3 (three) times daily as needed.  . montelukast (SINGULAIR) 10 MG tablet  Take 10 mg by mouth at bedtime.   . Multiple Vitamin (MULTIVITAMIN WITH MINERALS) TABS tablet Take 1 tablet by mouth daily.  . Multiple Vitamins-Minerals (HAIR/SKIN/NAILS PO) Take 1 tablet by mouth daily.   . nitroGLYCERIN (NITROSTAT) 0.4 MG SL tablet Place 1 tablet (0.4 mg total) under the tongue every 5 (five) minutes x 3 doses as needed.  . pantoprazole (PROTONIX) 20 MG tablet Take 20 mg by mouth daily.  . potassium chloride (KLOR-CON) 20 MEQ packet Take 20 mEq by mouth daily.  . rosuvastatin (CRESTOR) 40 MG tablet Take 1 tablet (40 mg total) by mouth daily.  . sertraline (ZOLOFT) 25 MG tablet Take 25 mg by mouth daily as needed (Hot Flashes).  . traMADol (ULTRAM) 50 MG tablet Take 50 mg by mouth as needed for pain.  Marland Kitchen UNABLE TO FIND ASA 500MG  AND CAFFEINE  32.5MG  AS NEEDED   . Zinc Sulfate (ZINC 15 PO) Take by mouth.    Current Facility-Administered Medications for the 07/21/20 encounter (Office Visit) with Carole Civil, MD  Medication  . methylPREDNISolone acetate (DEPO-MEDROL) injection 40 mg    BP (!) 201/103   Pulse 68   Ht 5\' 3"  (1.6 m)   Wt 175 lb (79.4 kg)   BMI 31.00 kg/m   Physical Exam Constitutional:      General: She is not in acute distress.    Appearance: She is well-developed.  Cardiovascular:     Comments: No peripheral edema Skin:    General: Skin is warm and dry.  Neurological:     Mental Status: She is alert and oriented to person, place, and time.     Sensory: No sensory deficit.     Coordination: Coordination normal.     Gait: Gait normal.     Deep Tendon Reflexes: Reflexes are normal and symmetric.     Ortho Exam   Left hip exam surprisingly she does have tenderness in the lower back in the midline right and left side but she has a negative straight leg raise  She has good hip flexion extension rotation but has pain with internal rotation and flexion of the hip  MEDICAL DECISION SECTION  xrays ordered? no  My independent reading of  xrays: She did have an MRI which showed osteoarthritis left hip no x-rays available at this time   Encounter Diagnoses  Name Primary?  . Chronic left hip pain Yes  . DDD (degenerative disc disease), lumbar       MRI  IMPRESSION: 1. Moderate bilateral degenerative hip arthropathy, especially along the left side, with confluent degenerative subcortical cysts along the left acetabulum. 2. Suspected degenerative tearing throughout the left acetabular superior labrum. 3. Subtle edema posteriorly in the left iliopsoas  muscle adjacent to the joint, suspicious for mild strain. 4. Degenerative disc disease at L5-S1. 5. Prior hysterectomy. 1.2 by 0.8 cm cystic lesion along the vaginal Cuff.    Encounter Diagnoses  Name Primary?  . Chronic left hip pain Yes  . DDD (degenerative disc disease), lumbar     Recommend physical therapy to address lower back and hip pain  Intra-articular injection left hip  Return in 6 weeks  Meds ordered this encounter  Medications  . methylPREDNISolone acetate (DEPO-MEDROL) injection 40 mg    Arther Abbott, MD 07/21/2020 9:09 AM

## 2020-07-21 NOTE — Addendum Note (Signed)
Addended byCandice Camp on: 07/21/2020 10:22 AM   Modules accepted: Orders

## 2020-07-21 NOTE — Patient Instructions (Addendum)
SCHEDULE INJECTION LEFT HIP  THERAPY HIP AND BACK

## 2020-07-22 ENCOUNTER — Telehealth: Payer: Self-pay | Admitting: Radiology

## 2020-07-22 NOTE — Telephone Encounter (Signed)
Left hip injection at [facility] called to schedule her for and the Tech did not know when they could schedule and asked Peggy with central scheduling to call back in a bit to get appointment  Peggy will call patient  to schedule the injection  I have called patient and told her to expect a call

## 2020-07-23 ENCOUNTER — Other Ambulatory Visit: Payer: Self-pay | Admitting: Family Medicine

## 2020-08-04 NOTE — Telephone Encounter (Signed)
She has scheduled with the patient

## 2020-08-04 NOTE — Telephone Encounter (Signed)
I called and phone call was disconnected Have sent Email to [facility]

## 2020-08-04 NOTE — Telephone Encounter (Signed)
Diane Parker called and stated that she still hasnt heard from anyone about scheduling the injection.  She wants to know if you can check on this for her and give her a call back?  Thanks

## 2020-08-09 ENCOUNTER — Other Ambulatory Visit: Payer: Self-pay

## 2020-08-09 ENCOUNTER — Ambulatory Visit (HOSPITAL_COMMUNITY)
Admission: RE | Admit: 2020-08-09 | Discharge: 2020-08-09 | Disposition: A | Discharge status: Home / Self Care | Payer: BC Managed Care – PPO | Source: Ambulatory Visit | Attending: Orthopedic Surgery | Admitting: Orthopedic Surgery

## 2020-08-09 DIAGNOSIS — G8929 Other chronic pain: Secondary | ICD-10-CM | POA: Diagnosis not present

## 2020-08-09 DIAGNOSIS — M25552 Pain in left hip: Secondary | ICD-10-CM | POA: Insufficient documentation

## 2020-08-09 IMAGING — RF DG FLUORO GUIDE NDL PLC/BX
2 series · 5 of 5 positions shown · non-contrast
Comparison: MRI LEFT hip [DATE]

CLINICAL DATA: Therapeutic LEFT hip injection, chronic LEFT hip
pain

EXAM:
LEFT HIP INJECTION UNDER FLUOROSCOPY

[Series 1: cp_standard · 0.17mm/px · 1 of 1 slices shown (1 of 2)]
[im 1/1]
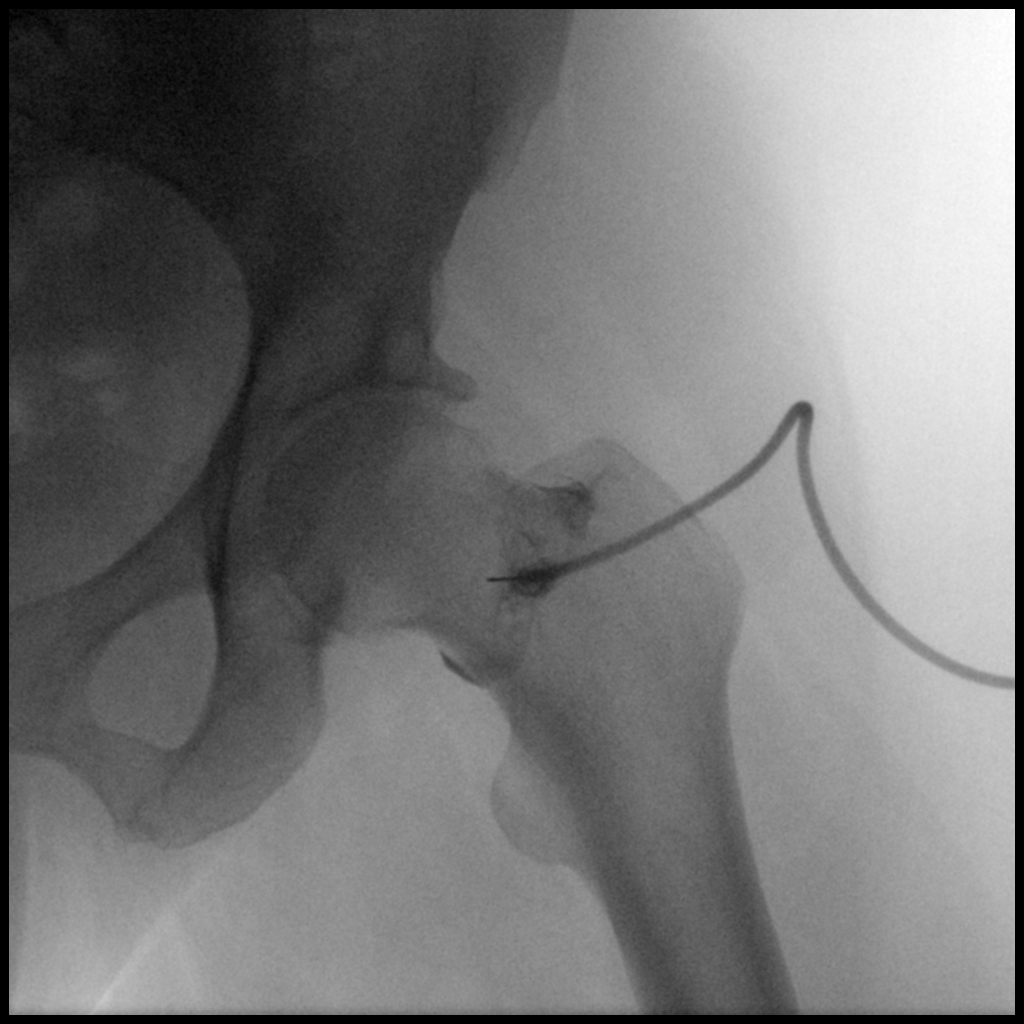

[Series 2: cp_standard · 0.17mm/px · 4 of 22 frames shown (2 of 2)]
[frame 2/22]
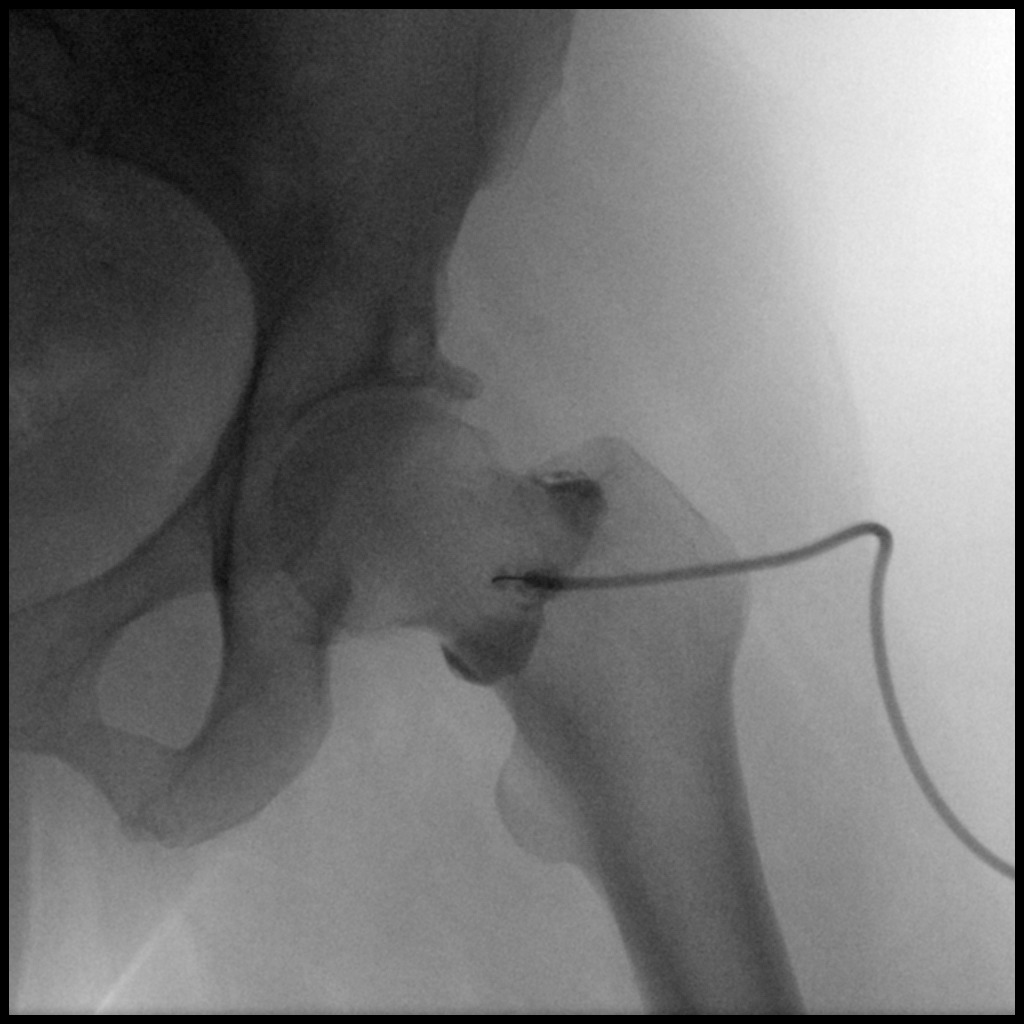
[frame 4/22]
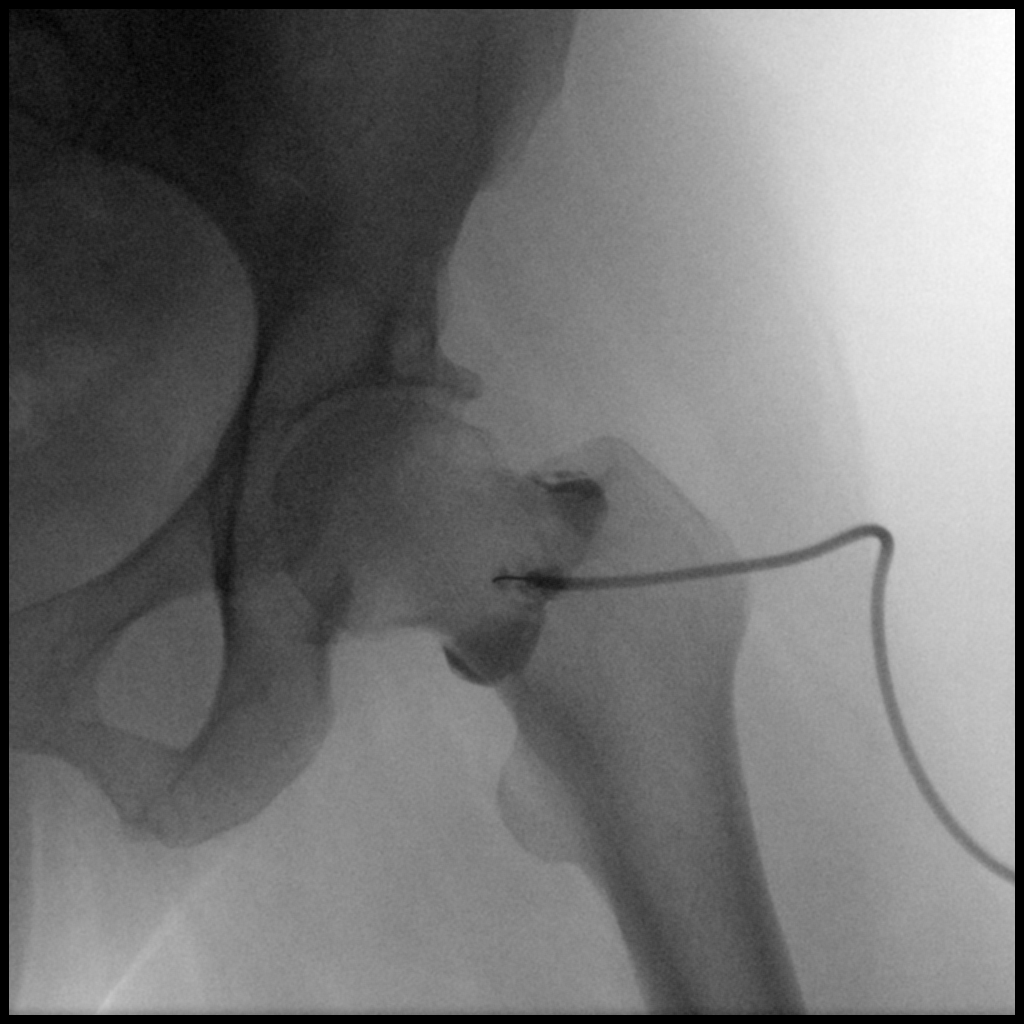
[frame 12/22]
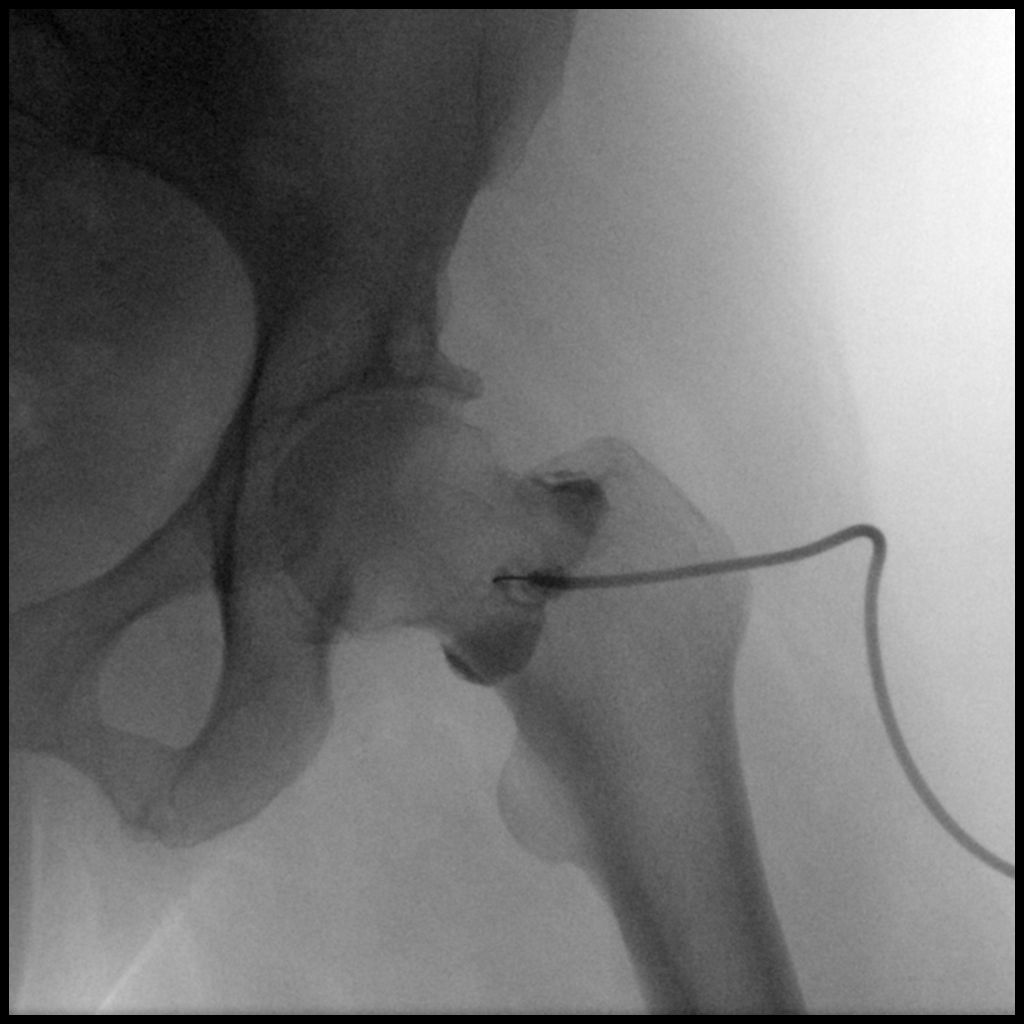
[frame 19/22]
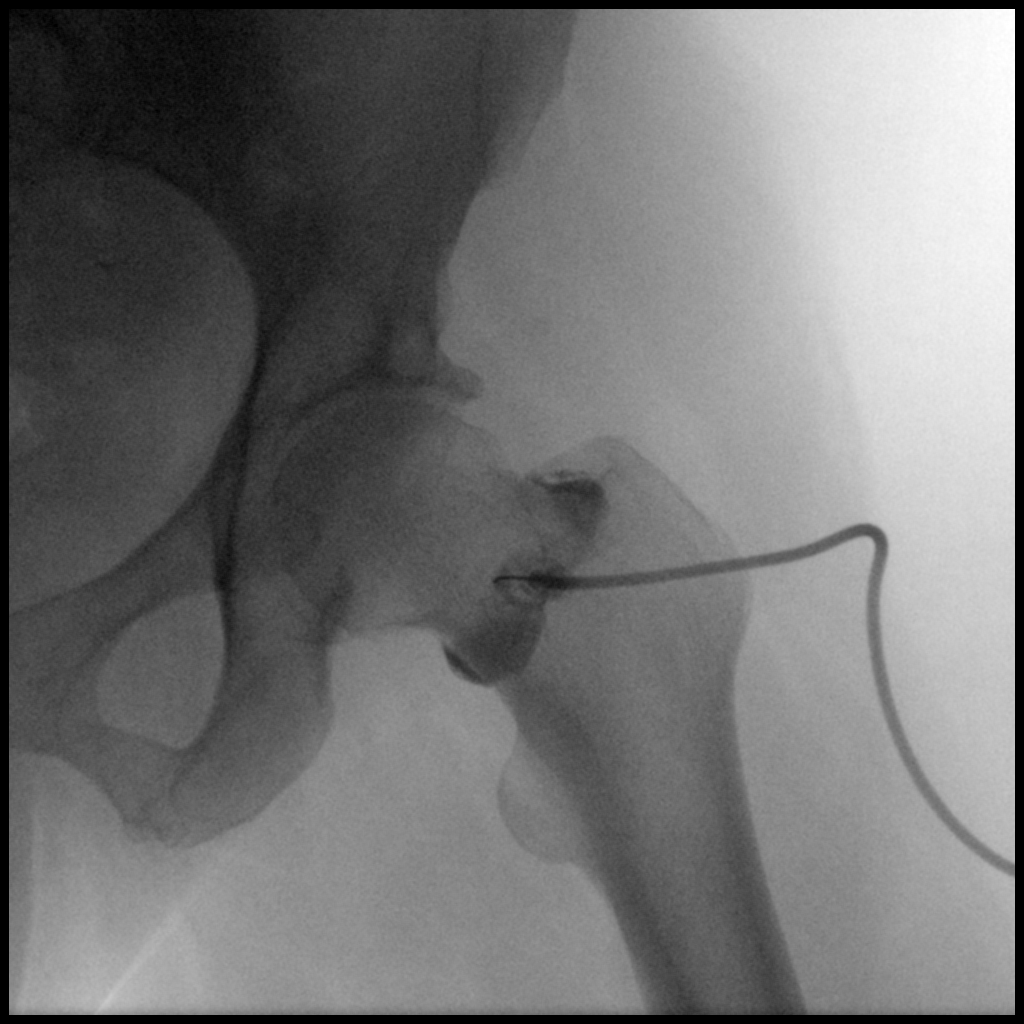

[5 of 5 positions shown; findings below may reference images not displayed]

FLUOROSCOPY TIME:  Fluoroscopy Time:  0 minutes 42 seconds

Radiation Exposure Index (if provided by the fluoroscopic device):
5.4 mGy

Number of Acquired Spot Images: 2

PROCEDURE:
Procedure, risks, benefits and alternatives explained to the
patient.

Patient's questions answered.

Written informed consent obtained.

Timeout protocol and radiation time-out protocol followed.

LEFT hip joint localized by fluoroscopy.

Skin prepped and draped in usual sterile fashion.

Skin and soft tissues anesthetized with 7 mL of 1% lidocaine.

Under fluoroscopic guidance, 22 gauge spinal needle was advanced
into LEFT hip joint.

Intra-articular position of the needle tip confirmed with injection
of 2 mL of Omnipaque 300.

4 mL of a solution consisting of 3 mL of 1% lidocaine and 40 mg of
Depo-Medrol was injected into LEFT hip joint without difficulty.

Procedure tolerated well by patient without immediate complication.
IMPRESSION: Technically successful therapeutic LEFT hip injection under
fluoroscopy.

## 2020-08-09 MED ORDER — IOHEXOL 300 MG/ML  SOLN
30.0000 mL | Freq: Once | INTRAMUSCULAR | Status: AC | PRN
  Administered 2020-08-09: 2 mL via INTRA_ARTICULAR

## 2020-08-09 MED ORDER — METHYLPREDNISOLONE ACETATE 40 MG/ML IJ SUSP
INTRAMUSCULAR | Status: AC
  Administered 2020-08-09: 40 mg
  Filled 2020-08-09: qty 1

## 2020-08-09 MED ORDER — LIDOCAINE HCL (PF) 1 % IJ SOLN
INTRAMUSCULAR | Status: AC
  Administered 2020-08-09 (×2): 5 mL
  Filled 2020-08-09: qty 10

## 2020-08-09 MED ORDER — POVIDONE-IODINE 10 % EX SOLN
CUTANEOUS | Status: AC
  Administered 2020-08-09: 1
  Filled 2020-08-09: qty 15

## 2020-08-09 NOTE — Procedures (Signed)
Preprocedure Dx: Chronic LEFT hip pain Postprocedure Dx: Chronic LEFT hip pain Procedure  Fluoroscopically guided therapeutic LEFT hip joint injection Radiologist:  Thornton Papas Anesthesia:  7 ml of 1% lidocaine Injectate:  40mg  DepoMedrol, 3 ml of 1% lidocaine Fluoro time:  0 minutes 42 seconds EBL:   None Complications: None

## 2020-08-13 DIAGNOSIS — G8929 Other chronic pain: Secondary | ICD-10-CM | POA: Diagnosis not present

## 2020-08-13 DIAGNOSIS — M25552 Pain in left hip: Secondary | ICD-10-CM | POA: Diagnosis not present

## 2020-09-27 ENCOUNTER — Telehealth: Payer: Self-pay | Admitting: Family Medicine

## 2020-09-27 MED ORDER — HYDRALAZINE HCL 25 MG PO TABS
25.0000 mg | ORAL_TABLET | Freq: Three times a day (TID) | ORAL | 3 refills | Status: DC
Start: 2020-09-27 — End: 2022-02-02

## 2020-09-27 NOTE — Telephone Encounter (Signed)
*  STAT* If patient is at the pharmacy, call can be transferred to refill team.   1. Which medications need to be refilled? (please list name of each medication and dose if known) hydrALAZINE (APRESOLINE) 25 MG tablet  2. Which pharmacy/location (including street and city if local pharmacy) is medication to be sent to? [facility]  3. Do they need a 30 day or 90 day supply? Enough to last until her 10/06/20 Appointment with her PCP: Dr. Len Blalock  Patient called said her PCP has been filling her hydrALAZINE (APRESOLINE) 25 MG tablet medication but she is currently out until her next appointment with him on 10/06/20.

## 2020-10-06 DIAGNOSIS — I700 Atherosclerosis of aorta: Secondary | ICD-10-CM | POA: Diagnosis not present

## 2020-10-06 DIAGNOSIS — I10 Essential (primary) hypertension: Secondary | ICD-10-CM | POA: Diagnosis not present

## 2020-10-06 DIAGNOSIS — Z23 Encounter for immunization: Secondary | ICD-10-CM | POA: Diagnosis not present

## 2020-10-06 DIAGNOSIS — K219 Gastro-esophageal reflux disease without esophagitis: Secondary | ICD-10-CM | POA: Diagnosis not present

## 2020-10-06 DIAGNOSIS — R7309 Other abnormal glucose: Secondary | ICD-10-CM | POA: Diagnosis not present

## 2020-10-06 DIAGNOSIS — M1991 Primary osteoarthritis, unspecified site: Secondary | ICD-10-CM | POA: Diagnosis not present

## 2020-11-09 DIAGNOSIS — H524 Presbyopia: Secondary | ICD-10-CM | POA: Diagnosis not present

## 2020-11-15 ENCOUNTER — Encounter (HOSPITAL_COMMUNITY): Payer: Self-pay

## 2020-11-15 ENCOUNTER — Ambulatory Visit (HOSPITAL_COMMUNITY): Admission: RE | Admit: 2020-11-15 | Payer: Medicare Other | Source: Ambulatory Visit

## 2020-11-15 ENCOUNTER — Other Ambulatory Visit (HOSPITAL_COMMUNITY): Payer: Self-pay | Admitting: Internal Medicine

## 2020-11-15 DIAGNOSIS — I10 Essential (primary) hypertension: Secondary | ICD-10-CM | POA: Diagnosis not present

## 2020-11-15 DIAGNOSIS — R1031 Right lower quadrant pain: Secondary | ICD-10-CM | POA: Diagnosis not present

## 2020-11-15 DIAGNOSIS — R109 Unspecified abdominal pain: Secondary | ICD-10-CM

## 2020-11-15 DIAGNOSIS — I700 Atherosclerosis of aorta: Secondary | ICD-10-CM | POA: Diagnosis not present

## 2020-11-18 DIAGNOSIS — R002 Palpitations: Secondary | ICD-10-CM | POA: Insufficient documentation

## 2020-11-18 NOTE — Progress Notes (Signed)
Cardiology Office Note   Date:  11/19/2020   ID:  Diane Parker, DOB 1956-03-21, MRN [id number]  PCP:  Redmond School, MD  Cardiologist:   Minus Breeding, MD   Chief Complaint  Patient presents with  . Shoulder Pain      History of Present Illness: Diane Parker is a 65 y.o. female who was seen by Dr. Bronson Ing.   with a history of CAD (RCA stent placement for mid RCA 99% stenosis 03/29/2017, also stenosis of 90% and ostium of first diagonal branch), HTN, HLD, palpitations.  Since she was last seen she had an episode of severe shoulder pain.  This was about a month ago.  It was left arm and shoulder pain with weakness.  It did radiate somewhat down to the left arm and into the chest.  It felt a little like her angina but not nearly as intense or necessarily the same quality.  However, did not feel like her arthritis either.  She took 2 nitroglycerin and waited another 30 minutes and took another couple of nitroglycerin and the pain subsided.  She absolutely did not want to go to the hospital.  With this when it was at its peak of 10 out of 10 she had some nausea and shortness of breath.  She worked all night on her third shift and thought it was related to her repetitive motions that she does not work.  She has had some sporadic chest discomfort since then and has taken about 6 nitroglycerin which is a little bit more than she has been chronically previously.  However, she has been able to be very active doing things like pushing the vacuum and doing all of her housework without bringing on any of this discomfort.  She has not had any PND or orthopnea.  She is otherwise done okay and has not had any cough fevers or chills.  She has had no weight gain or edema.  She has had rare palpitations but no presyncope or syncope.  Past Medical History:  Diagnosis Date  . Acute diverticulitis 03/28/2013   Seen on CT - treated with flagyl an cipro  . Arthritis    "shoulders, knees, hips"  (03/29/2017)  . CAD (coronary artery disease), native coronary artery    03/29/17 PCI/DES to the mRCA, EF normal   . Essential hypertension   . GERD (gastroesophageal reflux disease)   . Headache    "bad ones lately; maybe q other day" (03/29/2017)  . High cholesterol     Past Surgical History:  Procedure Laterality Date  . ABDOMINAL HYSTERECTOMY    . COLONOSCOPY  11/08/2007   WGN:[id number], rectosigmoid junction/hyperplastic-appearing polypoid    mucosa, 1 of these areas biopsied.  Otherwise, normal rectum/Left-sided transverse diverticula/Diminutive polyp just distal to the ileocecal valve. Hyperplastic and adenomatous polyp.   . COLONOSCOPY N/A 06/26/2013   ONG:EXBMWUX diverticulosis. Colonic polyps removed  . CORONARY ANGIOPLASTY WITH STENT PLACEMENT  03/29/2017  . CORONARY STENT INTERVENTION N/A 03/29/2017   Procedure: Coronary Stent Intervention;  Surgeon: Lorretta Harp, MD;  Location: [place] [facility];  Service: Cardiovascular;  Laterality: N/A;  . DILATION AND CURETTAGE OF UTERUS    . ESOPHAGOGASTRODUODENOSCOPY  2008   RMR: normal  . LEFT HEART CATH AND CORONARY ANGIOGRAPHY N/A 03/29/2017   Procedure: Left Heart Cath and Coronary Angiography;  Surgeon: Lorretta Harp, MD;  Location: [place] [facility];  Service: Cardiovascular;  Laterality: N/A;  . TUBAL LIGATION  Current Outpatient Medications  Medication Sig Dispense Refill  . aspirin 81 MG chewable tablet Chew 1 tablet (81 mg total) by mouth daily. 30 tablet 0  . b complex vitamins tablet Take 1 tablet by mouth daily.    . carvedilol (COREG) 6.25 MG tablet Take 1 tablet (6.25 mg total) by mouth 2 (two) times daily. 180 tablet 1  . diclofenac sodium (VOLTAREN) 1 % GEL Apply topically 4 (four) times daily.    Marland Kitchen diltiazem (CARDIZEM) 30 MG tablet Take 1 tablet (30 mg total) by mouth daily as needed (palpitations). 30 tablet 2  . estradiol (ESTRACE) 2 MG tablet TAKE ONE TABLET BY MOUTH EVERY DAY 30 tablet 6  .  ferrous sulfate 325 (65 FE) MG EC tablet Take 325 mg by mouth daily with breakfast.    . hydrALAZINE (APRESOLINE) 25 MG tablet Take 1 tablet (25 mg total) by mouth 3 (three) times daily. 270 tablet 3  . hydrochlorothiazide (HYDRODIURIL) 25 MG tablet Take 25 mg by mouth daily.    Marland Kitchen HYDROcodone-acetaminophen (NORCO/VICODIN) 5-325 MG tablet Take 1 tablet by mouth every 4 (four) hours as needed.    . hydrOXYzine (ATARAX/VISTARIL) 10 MG tablet Take 10 mg by mouth 3 (three) times daily as needed.    . montelukast (SINGULAIR) 10 MG tablet Take 10 mg by mouth at bedtime.     . Multiple Vitamin (MULTIVITAMIN WITH MINERALS) TABS tablet Take 1 tablet by mouth daily.    . Multiple Vitamins-Minerals (HAIR/SKIN/NAILS PO) Take 1 tablet by mouth daily.     . nitroGLYCERIN (NITROSTAT) 0.4 MG SL tablet Place 1 tablet (0.4 mg total) under the tongue every 5 (five) minutes x 3 doses as needed. 25 tablet 3  . omeprazole (PRILOSEC) 40 MG capsule Take 40 mg by mouth daily.    . potassium chloride (KLOR-CON) 20 MEQ packet Take 20 mEq by mouth daily.    . rosuvastatin (CRESTOR) 40 MG tablet Take 1 tablet (40 mg total) by mouth daily. 90 tablet 3  . sertraline (ZOLOFT) 25 MG tablet Take 25 mg by mouth daily as needed (Hot Flashes).    . traMADol (ULTRAM) 50 MG tablet Take 50 mg by mouth as needed for pain.    Marland Kitchen UNABLE TO FIND ASA 500MG  AND CAFFEINE  32.5MG  AS NEEDED     . Zinc Sulfate (ZINC 15 PO) Take by mouth.      No current facility-administered medications for this visit.    Allergies:   Penicillins    ROS:  Please see the history of present illness.   Otherwise, review of systems are positive for none.   All other systems are reviewed and negative.    PHYSICAL EXAM: VS:  BP (!) 150/78   Pulse 60   Ht 5\' 3"  (1.6 m)   Wt 172 lb (78 kg)   SpO2 97%   BMI 30.47 kg/m  , BMI Body mass index is 30.47 kg/m. GENERAL:  Well appearing NECK:  No jugular venous distention, waveform within normal limits, carotid  upstroke brisk and symmetric, no bruits, no thyromegaly LUNGS:  Clear to auscultation bilaterally CHEST:  Unremarkable HEART:  PMI not displaced or sustained,S1 and S2 within normal limits, no S3, no S4, no clicks, no rubs, no murmurs ABD:  Flat, positive bowel sounds normal in frequency in pitch, no bruits, no rebound, no guarding, no midline pulsatile mass, no hepatomegaly, no splenomegaly EXT:  2 plus pulses throughout, no edema, no cyanosis no clubbing  EKG:  EKG is  not ordered today. The ekg ordered 06/17/2020 demonstrates sinus rhythm, missing data, low voltage, nonspecific T wave flattening   Recent Labs: No results found for requested labs within last 8760 hours.    Lipid Panel No results found for: CHOL, TRIG, HDL, CHOLHDL, VLDL, LDLCALC, LDLDIRECT    Wt Readings from Last 3 Encounters:  11/19/20 172 lb (78 kg)  07/21/20 175 lb (79.4 kg)  06/17/20 176 lb 3.2 oz (79.9 kg)      Other studies Reviewed: Additional studies/ records that were reviewed today include: Labs. Review of the above records demonstrates:  Please see elsewhere in the note.     ASSESSMENT AND PLAN:  CAD in native artery The patient had shoulder discomfort.  Is a little bit confusing because she does have a lot of arthritis.  It is not exactly like her previous angina.  She did not want to have hospitalization and would not want to have a heart catheterization unless absolutely indicated.  I will try to screen her with stress testing.  I do not think she be able to walk on a treadmill so she will have a Lexiscan Myoview. This might be somewhat difficult to assess with the study because of previous occluded right coronary artery and she does have a diagonal with high-grade lesion that was managed medically.  However, I will be looking for high-grade lesions particularly in the anterior wall or obvious ischemia in the inferior wall.  Also if she has any ongoing symptoms she will need invasive evaluation.    Essential hypertension She says her blood pressures been labile and not typically this high.  She is going to check it 3 times a day and in 10 days give [place] the blood pressure readings and she might need further med adjustment.   Hyperlipidemia  I do not have her most recent lipids but I told her the goal LDL is less than 70 and she will check with her primary care physician.   Palpitations She has no significant palpitations.  No change in therapy.   Current medicines are reviewed at length with the patient today.  The patient does not have concerns regarding medicines.  The following changes have been made:  no change  Labs/ tests ordered today include:   Orders Placed This Encounter  Procedures  . NM Myocar Multi W/Spect W/Wall Motion / EF     Disposition:   FU with APP in one month.     Signed, Minus Breeding, MD  11/19/2020 11:22 AM    [facility]

## 2020-11-19 ENCOUNTER — Encounter: Payer: Self-pay | Admitting: Cardiology

## 2020-11-19 ENCOUNTER — Encounter: Payer: Self-pay | Admitting: *Deleted

## 2020-11-19 ENCOUNTER — Ambulatory Visit (INDEPENDENT_AMBULATORY_CARE_PROVIDER_SITE_OTHER): Payer: BC Managed Care – PPO | Admitting: Cardiology

## 2020-11-19 VITALS — BP 150/78 | HR 60 | Ht 63.0 in | Wt 172.0 lb

## 2020-11-19 DIAGNOSIS — R079 Chest pain, unspecified: Secondary | ICD-10-CM | POA: Diagnosis not present

## 2020-11-19 DIAGNOSIS — E785 Hyperlipidemia, unspecified: Secondary | ICD-10-CM

## 2020-11-19 DIAGNOSIS — Z9861 Coronary angioplasty status: Secondary | ICD-10-CM

## 2020-11-19 DIAGNOSIS — I10 Essential (primary) hypertension: Secondary | ICD-10-CM | POA: Diagnosis not present

## 2020-11-19 DIAGNOSIS — R002 Palpitations: Secondary | ICD-10-CM | POA: Diagnosis not present

## 2020-11-19 DIAGNOSIS — I2510 Atherosclerotic heart disease of native coronary artery without angina pectoris: Secondary | ICD-10-CM | POA: Diagnosis not present

## 2020-11-19 NOTE — Patient Instructions (Addendum)
Medication Instructions:   Your physician recommends that you continue on your current medications as directed. Please refer to the Current Medication list given to you today.  Labwork:  none  Testing/Procedures: Your physician has requested that you have a lexiscan myoview. For further information please visit [email or web address]. Please follow instruction sheet, as given.  Follow-Up:  Your physician recommends that you schedule a follow-up appointment in: 1 month.  Any Other Special Instructions Will Be Listed Below (If Applicable). Your physician has requested that you regularly monitor and record your blood pressure readings at home. Please use the same machine at the same time of day to check your readings and record them to bring to your follow-up visit.  If you need a refill on your cardiac medications before your next appointment, please call your pharmacy. Blood Pressure Record Sheet To take your blood pressure, you will need a blood pressure machine. You can buy a blood pressure machine (blood pressure monitor) at your clinic, drug store, or online. When choosing one, consider:  An automatic monitor that has an arm cuff.  A cuff that wraps snugly around your upper arm. You should be able to fit only one finger between your arm and the cuff.  A device that stores blood pressure reading results.  Do not choose a monitor that measures your blood pressure from your wrist or finger. Follow your health care provider's instructions for how to take your blood pressure. To use this form:  Get one reading in the morning (a.m.) before you take any medicines.  Get one reading in the evening (p.m.) before supper.  Take at least 2 readings with each blood pressure check. This makes sure the results are correct. Wait 1-2 minutes between measurements.  Write down the results in the spaces on this form.  Repeat this once a week, or as told by your health care provider.  Make a  follow-up appointment with your health care provider to discuss the results. Blood pressure log Date: _______________________  a.m. _____________________(1st reading) _____________________(2nd reading)  p.m. _____________________(1st reading) _____________________(2nd reading) Date: _______________________  a.m. _____________________(1st reading) _____________________(2nd reading)  p.m. _____________________(1st reading) _____________________(2nd reading) Date: _______________________  a.m. _____________________(1st reading) _____________________(2nd reading)  p.m. _____________________(1st reading) _____________________(2nd reading) Date: _______________________  a.m. _____________________(1st reading) _____________________(2nd reading)  p.m. _____________________(1st reading) _____________________(2nd reading) Date: _______________________  a.m. _____________________(1st reading) _____________________(2nd reading)  p.m. _____________________(1st reading) _____________________(2nd reading) This information is not intended to replace advice given to you by your health care provider. Make sure you discuss any questions you have with your health care provider. Document Revised: 12/10/2019 Document Reviewed: 12/10/2019 Elsevier Patient Education  2021 [employer].

## 2020-11-22 ENCOUNTER — Telehealth: Payer: Self-pay | Admitting: Cardiology

## 2020-11-22 NOTE — Telephone Encounter (Signed)
Stress test scheduled 3/24

## 2020-11-22 NOTE — Telephone Encounter (Signed)
Patient returned a call to [facility]

## 2020-11-22 NOTE — Telephone Encounter (Signed)
Patient called stating that she is feeling fatigue with nausea. She is wanting to know if Dr. Percival Spanish will write her a work note to stay out of work until after this procedure.

## 2020-11-23 ENCOUNTER — Encounter: Payer: Self-pay | Admitting: *Deleted

## 2020-11-23 NOTE — Telephone Encounter (Signed)
She can have the work excuse but if she feels bad with chest or arm pain she should go to the ED.

## 2020-11-23 NOTE — Telephone Encounter (Signed)
Pt voiced understanding and says if symptoms worsen will have ED evaluation - pt will have someone pick up her work note from [place] office

## 2020-11-23 NOTE — Telephone Encounter (Signed)
Pt calling back saying she is feeling worse with nausea and fatigue and wanting a work note to remain out of work until after stress test

## 2020-11-25 ENCOUNTER — Other Ambulatory Visit: Payer: Self-pay

## 2020-11-25 ENCOUNTER — Ambulatory Visit (HOSPITAL_COMMUNITY)
Admission: RE | Admit: 2020-11-25 | Discharge: 2020-11-25 | Disposition: A | Discharge status: Home / Self Care | Payer: BC Managed Care – PPO | Source: Ambulatory Visit | Attending: Cardiology | Admitting: Cardiology

## 2020-11-25 ENCOUNTER — Encounter (HOSPITAL_BASED_OUTPATIENT_CLINIC_OR_DEPARTMENT_OTHER)
Admission: RE | Admit: 2020-11-25 | Discharge: 2020-11-25 | Disposition: A | Discharge status: Home / Self Care | Payer: BC Managed Care – PPO | Source: Ambulatory Visit | Attending: Cardiology | Admitting: Cardiology

## 2020-11-25 DIAGNOSIS — Z9861 Coronary angioplasty status: Secondary | ICD-10-CM | POA: Insufficient documentation

## 2020-11-25 DIAGNOSIS — R079 Chest pain, unspecified: Secondary | ICD-10-CM | POA: Diagnosis not present

## 2020-11-25 DIAGNOSIS — I2510 Atherosclerotic heart disease of native coronary artery without angina pectoris: Secondary | ICD-10-CM

## 2020-11-25 LAB — NM MYOCAR MULTI W/SPECT W/WALL MOTION / EF
LV dias vol: 95 mL (ref 46–106)
LV sys vol: 28 mL
Peak HR: 85 {beats}/min
RATE: 0.36
Rest HR: 58 {beats}/min
SDS: 0
SRS: 0
SSS: 0
TID: 1.02

## 2020-11-25 MED ORDER — TECHNETIUM TC 99M TETROFOSMIN IV KIT
10.0000 | PACK | Freq: Once | INTRAVENOUS | Status: AC | PRN
  Administered 2020-11-25: 10.6 via INTRAVENOUS

## 2020-11-25 MED ORDER — REGADENOSON 0.4 MG/5ML IV SOLN
INTRAVENOUS | Status: AC
  Administered 2020-11-25: 0.4 mg via INTRAVENOUS
  Filled 2020-11-25: qty 5

## 2020-11-25 MED ORDER — TECHNETIUM TC 99M TETROFOSMIN IV KIT
30.0000 | PACK | Freq: Once | INTRAVENOUS | Status: AC | PRN
  Administered 2020-11-25: 30 via INTRAVENOUS

## 2020-11-25 MED ORDER — SODIUM CHLORIDE FLUSH 0.9 % IV SOLN
INTRAVENOUS | Status: AC
  Administered 2020-11-25: 10 mL via INTRAVENOUS
  Filled 2020-11-25: qty 10

## 2020-11-26 ENCOUNTER — Ambulatory Visit (HOSPITAL_COMMUNITY)
Admission: RE | Admit: 2020-11-26 | Discharge: 2020-11-26 | Disposition: A | Discharge status: Home / Self Care | Payer: BC Managed Care – PPO | Source: Ambulatory Visit | Attending: Internal Medicine | Admitting: Internal Medicine

## 2020-11-26 DIAGNOSIS — R1031 Right lower quadrant pain: Secondary | ICD-10-CM | POA: Diagnosis not present

## 2020-11-26 DIAGNOSIS — R109 Unspecified abdominal pain: Secondary | ICD-10-CM | POA: Insufficient documentation

## 2020-11-26 LAB — POCT I-STAT CREATININE: Creatinine, Ser: 0.9 mg/dL (ref 0.44–1.00)

## 2020-11-26 IMAGING — CT CT ABD-PELV W/ CM
2 of 5 series · 17 of 46 positions shown, 19 images · IV contrast (Omnipaque or Isovue)
Comparison: [DATE]

CLINICAL DATA: Intermittent right lower quadrant pain for 1 year.
Gastroesophageal reflux disease. Hypertension. Partial hysterectomy.
Prior diverticulitis.

EXAM:
CT ABDOMEN AND PELVIS WITH CONTRAST
TECHNIQUE: Multidetector CT imaging of the abdomen and pelvis was performed
using the standard protocol following bolus administration of
intravenous contrast.
CONTRAST:  100mL OMNIPAQUE IOHEXOL 300 MG/ML  SOLN

[Series 2: axial st · axial · 0.82mm/px · z∈[+755,+1145]mm · 14 of 90 slices shown, 16 images]
[im 6/90  soft-tissue]
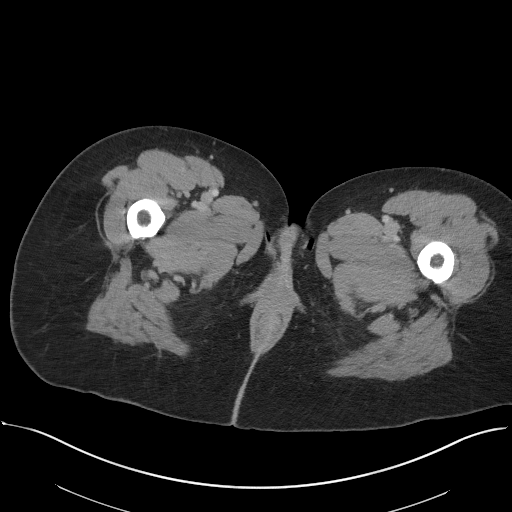
[im 6/90  bone]
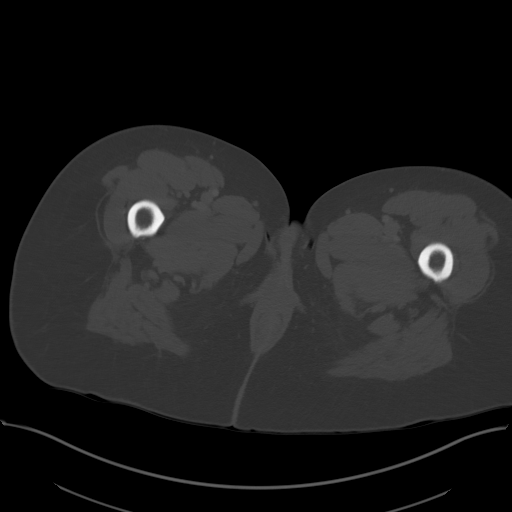
[im 12/90  soft-tissue]
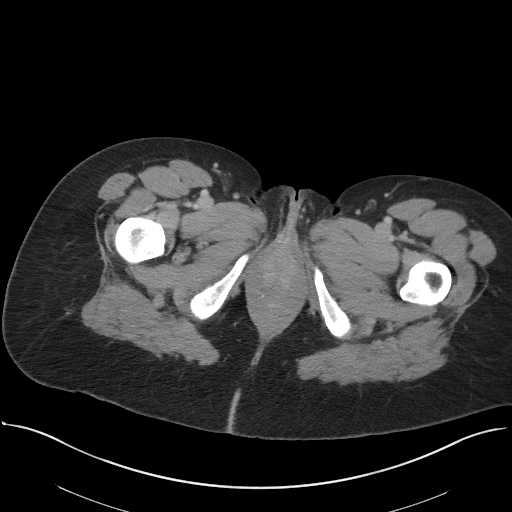
[im 17/90  soft-tissue]
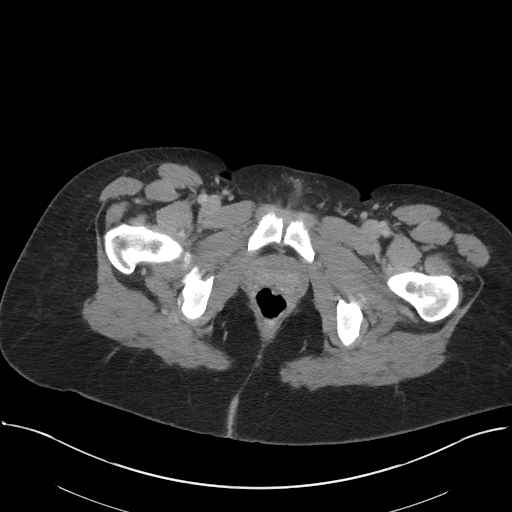
[im 23/90  soft-tissue]
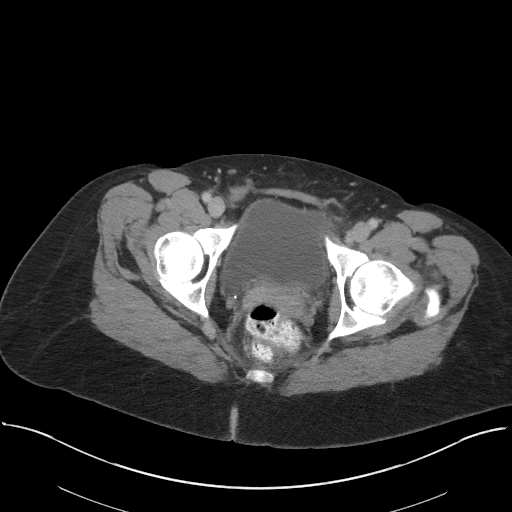
[im 28/90  soft-tissue]
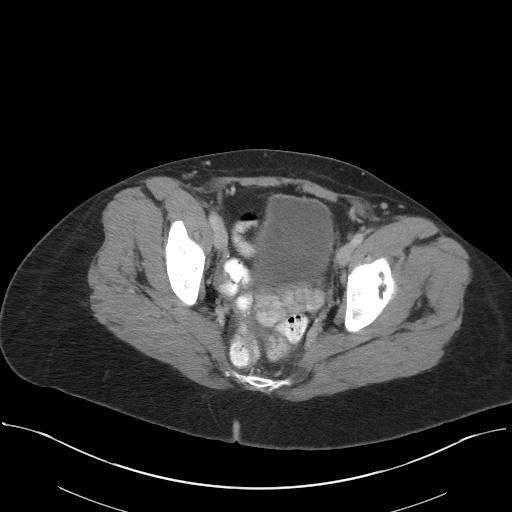
[im 34/90  soft-tissue]
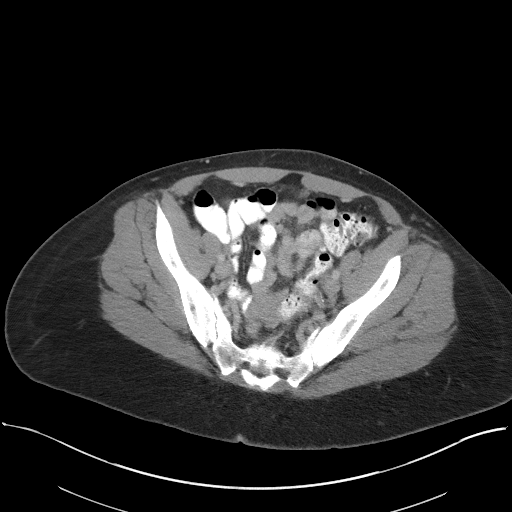
[im 39/90  soft-tissue]
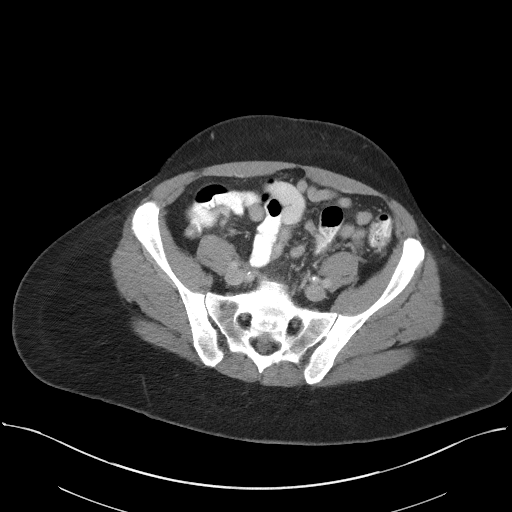
[im 51/90  soft-tissue]
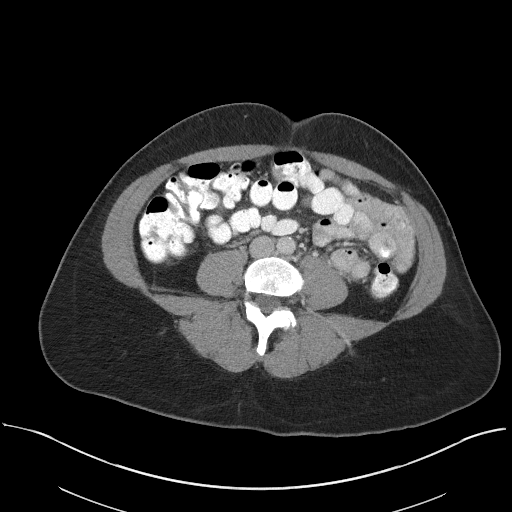
[im 56/90  soft-tissue]
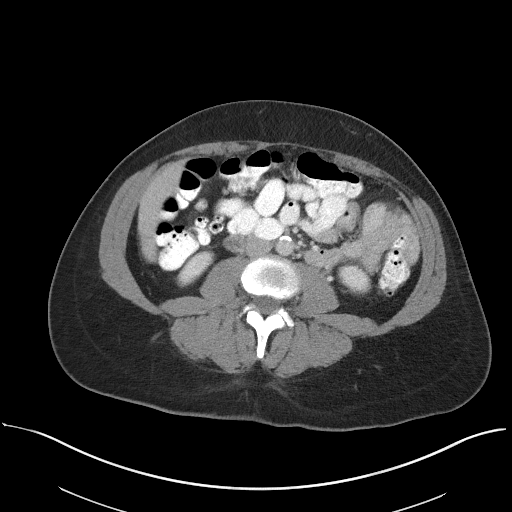
[im 56/90  bone]
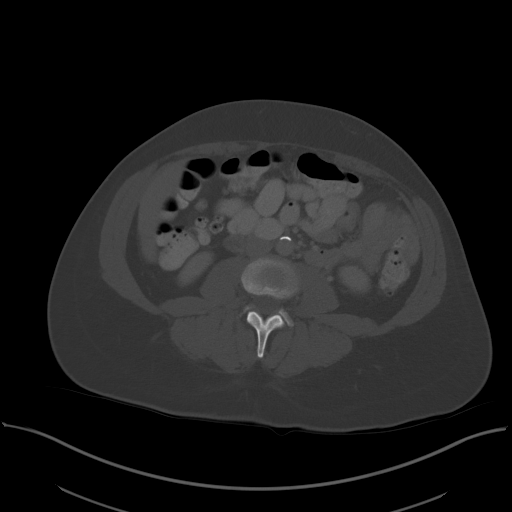
[im 62/90  soft-tissue]
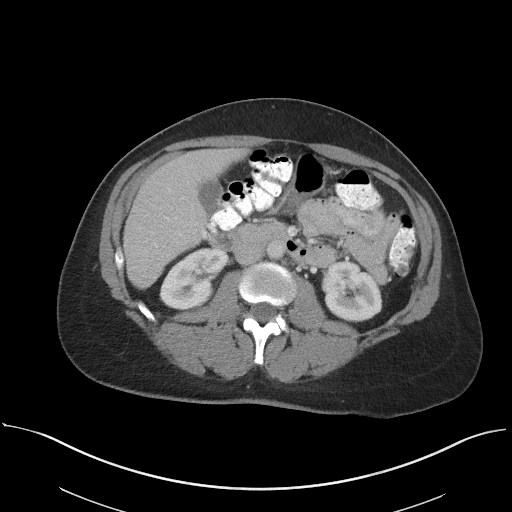
[im 67/90  soft-tissue]
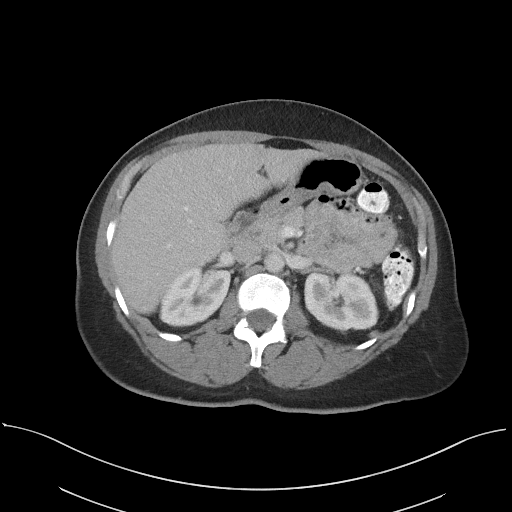
[im 73/90  soft-tissue]
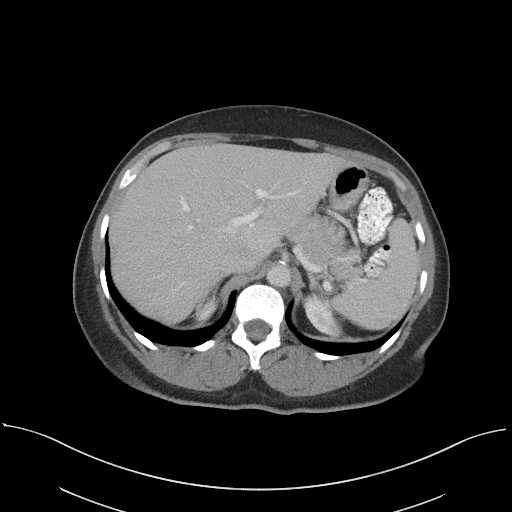
[im 78/90  soft-tissue]
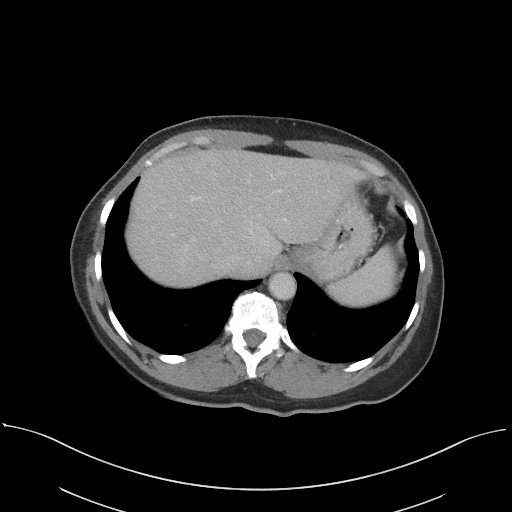
[im 84/90  soft-tissue]
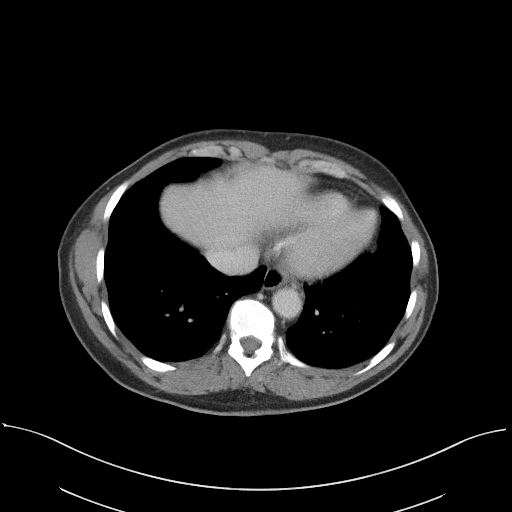

[Series 5: coronal st · coronal · 0.88mm/px · 3 of 106 slices shown]
[im 36/106  soft-tissue]
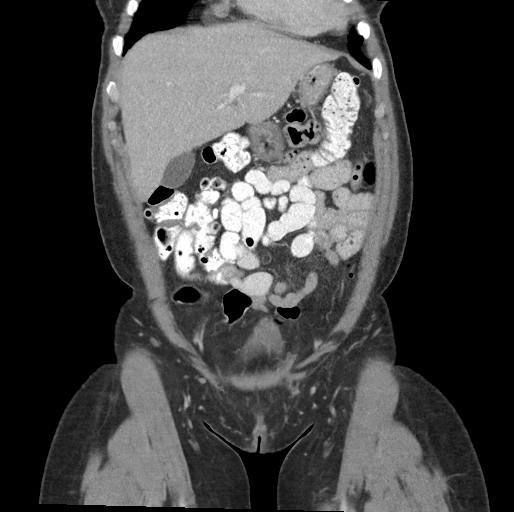
[im 47/106  soft-tissue]
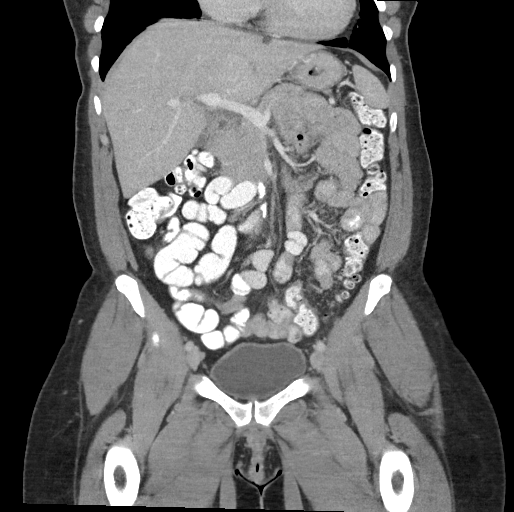
[im 59/106  soft-tissue]
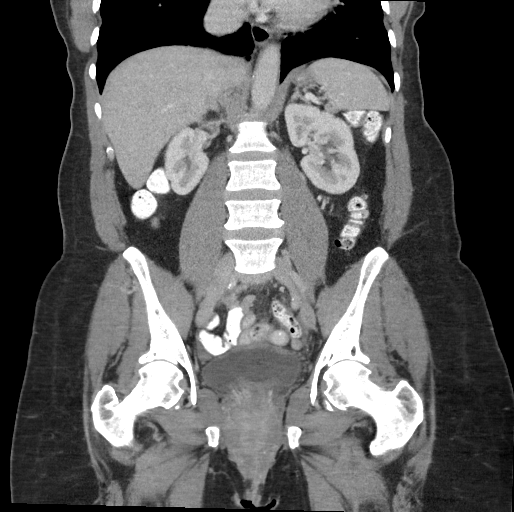

[17 of 46 positions shown; findings below may reference images not displayed]

FINDINGS: Lower chest: Clear lung bases. Mild cardiomegaly, without
pericardial or pleural effusion.

Hepatobiliary: High left hepatic lobe subcentimeter cysts. No
suspicious liver lesion. Normal gallbladder, without biliary ductal
dilatation.

Pancreas: Normal, without mass or ductal dilatation.

Spleen: Normal in size, without focal abnormality.

Adrenals/Urinary Tract: Normal adrenal glands. Normal kidneys,
without hydronephrosis. Normal urinary bladder.

Stomach/Bowel: Normal stomach, without wall thickening.

Extensive colonic diverticulosis. Normal terminal ileum. Normal
appendix, including on 42/2. Normal small bowel.

Vascular/Lymphatic: Aortic atherosclerosis. No abdominopelvic
adenopathy.

Reproductive: Partial hysterectomy. Hypoattenuation within the
remaining cervix is likely due to a nabothian cyst including at 8 mm
on 66/2. This was present in [96]. No adnexal mass.

Other: No significant free fluid. Mild pelvic floor laxity. No free
intraperitoneal air.

Musculoskeletal: Degenerative changes of the left greater than right
hips.
IMPRESSION: 1. No acute process in the abdomen or pelvis. No explanation for
right lower quadrant pain.
2. Aortic Atherosclerosis ([96]-[96]).

## 2020-11-26 MED ORDER — IOHEXOL 300 MG/ML  SOLN
100.0000 mL | Freq: Once | INTRAMUSCULAR | Status: AC | PRN
  Administered 2020-11-26: 100 mL via INTRAVENOUS

## 2020-11-29 ENCOUNTER — Telehealth: Payer: Self-pay | Admitting: Cardiology

## 2020-11-29 ENCOUNTER — Encounter: Payer: Self-pay | Admitting: *Deleted

## 2020-11-29 NOTE — Telephone Encounter (Signed)
New Message    Patient need a letter stating that she can return to work.  She went back to work yesterday and they said she needed a note saying that it was ok for her to return.  She is also waiting on her test results.

## 2020-11-29 NOTE — Telephone Encounter (Signed)
Minus Breeding, MD  11/28/2020 9:59 AM EDT      No ischemia or scar. Normal EF. I am not strongly suspicious that her pain is anginal. No further cardiac work up. Call Diane Parker with the results and send results to Redmond School, MD

## 2020-11-29 NOTE — Telephone Encounter (Signed)
Patient informed and verbalized understanding of plan. Work note available for pick up.

## 2020-12-14 ENCOUNTER — Other Ambulatory Visit (HOSPITAL_COMMUNITY): Payer: Self-pay | Admitting: Neurology

## 2020-12-14 DIAGNOSIS — Z79899 Other long term (current) drug therapy: Secondary | ICD-10-CM | POA: Diagnosis not present

## 2020-12-14 DIAGNOSIS — M5412 Radiculopathy, cervical region: Secondary | ICD-10-CM

## 2020-12-14 DIAGNOSIS — M5459 Other low back pain: Secondary | ICD-10-CM | POA: Diagnosis not present

## 2020-12-14 DIAGNOSIS — G894 Chronic pain syndrome: Secondary | ICD-10-CM | POA: Diagnosis not present

## 2020-12-14 DIAGNOSIS — Z79891 Long term (current) use of opiate analgesic: Secondary | ICD-10-CM | POA: Diagnosis not present

## 2020-12-19 NOTE — Progress Notes (Signed)
Cardiology Office Note  Date: 12/20/2020   ID: Diane Parker, DOB 1956-08-14, MRN [id number]  PCP:  Redmond School, MD  Cardiologist:  Minus Breeding, MD Electrophysiologist:  None   Chief Complaint: Cardiac follow up  History of Present Illness: Diane Parker is a 65 y.o. female with a history of CAD ( Stent to RCA mid RCA 99% stenosis. 90% stenosis ostium of first diagonal), HTN, HLD, palpitations  Last seen by Dr Percival Spanish 11/19/2020 with a recent episode of severe should pain with radiation to left arm and chest with weakness. She took 2 NTG SL. 30 minutes later took 2 more NTG SL and pain subsided. Had some associated nausea and SOB. Later had sporadic chest pain and took additional  NTG. She had been able to be very active using the vacuum and doing housework. She did not want to go to the hospital or have cardiac catheterization unless absolutely indicated. Dr. Percival Spanish ordered Leane Call. He mentioned if ongoing symptoms would need invasive evaluation. Her BP had been labile. Plans were to check 3 times a day for 10 days and report. He mentioned she may need medication adjustment. There were no recent lipid levels. She was told goal of LDL was less than 70 and would check with her PCP. She was not having significant palpitations and there were no changes to therapy   She is here for follow-up today status post recent Lexiscan stress test.  Stress test was considered negative and low risk per interpreting physician.  Patient denies any further anginal or exertional symptoms, palpitations or arrhythmias.  States has been taking more of her Cardizem recently and this seems to have calmed down her palpitations.  Her blood pressure is elevated today but she states she has had a lot of company over the weekend.  She is waiting on some of her medications to arrive from mail-order.  States she has not had her statin medication in about 2 weeks.  Advised blood pressure parameters of  130/80 or less.  Denies any orthostatic symptoms, CVA or TIA-like symptoms, PND, orthopnea, bleeding, DVT or PE-like symptoms, or lower extremity edema.   Past Medical History:  Diagnosis Date  . Acute diverticulitis 03/28/2013   Seen on CT - treated with flagyl an cipro  . Arthritis    "shoulders, knees, hips" (03/29/2017)  . CAD (coronary artery disease), native coronary artery    03/29/17 PCI/DES to the mRCA, EF normal   . Essential hypertension   . GERD (gastroesophageal reflux disease)   . Headache    "bad ones lately; maybe q other day" (03/29/2017)  . High cholesterol     Past Surgical History:  Procedure Laterality Date  . ABDOMINAL HYSTERECTOMY    . COLONOSCOPY  11/08/2007   [facility]:ENIDPOEUMPNT, rectosigmoid junction/hyperplastic-appearing polypoid    mucosa, 1 of these areas biopsied.  Otherwise, normal rectum/Left-sided transverse diverticula/Diminutive polyp just distal to the ileocecal valve. Hyperplastic and adenomatous polyp.   . COLONOSCOPY N/A 06/26/2013   [facility]:ERXVQMG diverticulosis. Colonic polyps removed  . CORONARY ANGIOPLASTY WITH STENT PLACEMENT  03/29/2017  . CORONARY STENT INTERVENTION N/A 03/29/2017   Procedure: Coronary Stent Intervention;  Surgeon: Lorretta Harp, MD;  Location: [facility];  Service: Cardiovascular;  Laterality: N/A;  . DILATION AND CURETTAGE OF UTERUS    . ESOPHAGOGASTRODUODENOSCOPY  2008   RMR: normal  . LEFT HEART CATH AND CORONARY ANGIOGRAPHY N/A 03/29/2017   Procedure: Left Heart Cath and Coronary Angiography;  Surgeon: Quay Burow  J, MD;  Location: [facility];  Service: Cardiovascular;  Laterality: N/A;  . TUBAL LIGATION      Current Outpatient Medications  Medication Sig Dispense Refill  . aspirin 81 MG chewable tablet Chew 1 tablet (81 mg total) by mouth daily. 30 tablet 0  . b complex vitamins tablet Take 1 tablet by mouth daily.    . carvedilol (COREG) 6.25 MG tablet Take 1 tablet (6.25 mg total) by mouth 2  (two) times daily. 180 tablet 1  . diclofenac sodium (VOLTAREN) 1 % GEL Apply topically 4 (four) times daily.    Marland Kitchen diltiazem (CARDIZEM) 30 MG tablet Take 1 tablet (30 mg total) by mouth daily as needed (palpitations). 30 tablet 2  . estradiol (ESTRACE) 2 MG tablet TAKE ONE TABLET BY MOUTH EVERY DAY 30 tablet 6  . ferrous sulfate 325 (65 FE) MG EC tablet Take 325 mg by mouth daily with breakfast.    . hydrALAZINE (APRESOLINE) 25 MG tablet Take 1 tablet (25 mg total) by mouth 3 (three) times daily. 270 tablet 3  . hydrochlorothiazide (HYDRODIURIL) 25 MG tablet Take 25 mg by mouth daily.    Marland Kitchen HYDROcodone-acetaminophen (NORCO/VICODIN) 5-325 MG tablet Take 1 tablet by mouth every 4 (four) hours as needed.    . hydrOXYzine (ATARAX/VISTARIL) 10 MG tablet Take 10 mg by mouth 3 (three) times daily as needed.    . montelukast (SINGULAIR) 10 MG tablet Take 10 mg by mouth at bedtime.     . Multiple Vitamin (MULTIVITAMIN WITH MINERALS) TABS tablet Take 1 tablet by mouth daily.    . Multiple Vitamins-Minerals (HAIR/SKIN/NAILS PO) Take 1 tablet by mouth daily.     . nitroGLYCERIN (NITROSTAT) 0.4 MG SL tablet Place 1 tablet (0.4 mg total) under the tongue every 5 (five) minutes x 3 doses as needed. 25 tablet 3  . omeprazole (PRILOSEC) 40 MG capsule Take 40 mg by mouth daily.    . potassium chloride (KLOR-CON) 20 MEQ packet Take 20 mEq by mouth daily.    . rosuvastatin (CRESTOR) 40 MG tablet Take 1 tablet (40 mg total) by mouth daily. 90 tablet 3  . sertraline (ZOLOFT) 25 MG tablet Take 25 mg by mouth daily as needed (Hot Flashes).    . traMADol (ULTRAM) 50 MG tablet Take 50 mg by mouth as needed for pain.    Marland Kitchen UNABLE TO FIND ASA 500MG  AND CAFFEINE  32.5MG  AS NEEDED     . Zinc Sulfate (ZINC 15 PO) Take by mouth.      No current facility-administered medications for this visit.   Allergies:  Penicillins   Social History: The patient  reports that she quit smoking about 32 years ago. Her smoking use included  cigarettes. She has a 0.36 pack-year smoking history. She has never used smokeless tobacco. She reports current alcohol use of about 3.0 standard drinks of alcohol per week. She reports previous drug use. Drug: Marijuana.   Family History: The patient's family history includes Aneurysm in her mother; Cancer in her cousin; Heart disease in her father.   ROS:  Please see the history of present illness. Otherwise, complete review of systems is positive for none.  All other systems are reviewed and negative.   Physical Exam: VS:  BP (!) 150/68   Pulse 70   Ht 5\' 3"  (1.6 m)   Wt 162 lb (73.5 kg)   SpO2 98%   BMI 28.70 kg/m , BMI Body mass index is 28.7 kg/m.  Wt Readings  from Last 3 Encounters:  12/20/20 162 lb (73.5 kg)  11/19/20 172 lb (78 kg)  07/21/20 175 lb (79.4 kg)    General: Patient appears comfortable at rest. Neck: Supple, no elevated JVP or carotid bruits, no thyromegaly. Lungs: Clear to auscultation, nonlabored breathing at rest. Cardiac: Regular rate and rhythm, no S3 or significant systolic murmur, no pericardial rub. Extremities: No pitting edema, distal pulses 2+. Skin: Warm and dry. Musculoskeletal: No kyphosis. Neuropsychiatric: Alert and oriented x3, affect grossly appropriate.  ECG:  EKG 06/17/2020 sinus rhythm with diffuse nonspecific T abnormalities rate of 61  Recent Labwork: 11/26/2020: Creatinine, Ser 0.90  No results found for: CHOL, TRIG, HDL, CHOLHDL, VLDL, LDLCALC, LDLDIRECT  Other Studies Reviewed Today:]  Carlton Adam Myoview  11/25/2020 Study Result  Narrative & Impression   Lexiscan stress is electrically negative for ischemia  Myovue scan shows normal perfusion No ischemia or scar  LVEF is 70%  Low risk study       Echocardiogram 07/02/2019 1. Left ventricular ejection fraction, by visual estimation, is 60 to 65%. The left ventricle has normal function. Normal left ventricular size. There is borderline left ventricular hypertrophy. 2.  Left ventricular diastolic Doppler parameters are consistent with impaired relaxation pattern of LV diastolic filling. 3. Global right ventricle has normal systolic function.The right ventricular size is normal. No increase in right ventricular wall thickness. 4. Left atrial size was normal. 5. Right atrial size was normal. 6. The mitral valve is grossly normal. Mild mitral valve regurgitation. 7. The tricuspid valve is grossly normal. Tricuspid valve regurgitation is trivial. 8. The aortic valve is tricuspid Aortic valve regurgitation is trivial by color flow Doppler. Mild aortic valve sclerosis without stenosis. 9. The pulmonic valve was grossly normal. Pulmonic valve regurgitation is not visualized by color flow Doppler. 10. TR signal is inadequate for assessing pulmonary artery systolic pressure. 11. The inferior vena cava is normal in size with greater than 50% respiratory variability, suggesting right atrial pressure of 3 mmHg. In comparison to the previous echocardiogram(s): There are no prior studies on this patient for comparison purposes.  Cardiac monitor 06/06/2019 Study Highlights  Sinus rhythm. No arrhythmias.  Symptoms correlated with sinus rhythm   Cardiac catheterization 03/09/2017 Coronary Stent Intervention  Left Heart Cath and Coronary Angiography    Conclusion    Prox LAD lesion, 40 %stenosed.  Ost 1st Diag lesion, 95 %stenosed.  Mid RCA lesion, 99 %stenosed.  Post intervention, there is a 0% residual stenosis.  A stent was successfully placed.  The left ventricular systolic function is normal.  LV end diastolic pressure is normal.  The left ventricular ejection fraction is 55-65% by visual estimate.   Nuriyah LAURI PURDUM is a 65 y.o. female   Diagnostic Dominance: Right    Intervention          Assessment and Plan:  1. CAD in native artery   2. Essential hypertension   3. Hyperlipidemia with target low density lipoprotein (LDL)  cholesterol less than 70 mg/dL   4. Palpitations    1. CAD in native artery Previous DES to RCA.  Recently had some shoulder pain.  Had a subsequent Lexiscan stress  which was deemed to be a low risk study.  She has had no further shoulder pain or back pain.  She states she recently had an injection in her neck for arthritis which has improved some of the symptoms.  Continue aspirin 81 mg daily.  Continue carvedilol 6.25 mg p.o. twice daily.  Continue nitroglycerin  sublingual as needed.  2. Essential hypertension Blood pressure 150/68.  Advised blood pressure goal would be 130/80 or less.  Advised her to monitor and sustained 130/80 or greater may need medication adjustment.  States she will monitor.  States she has had a lot of company at her home this weekend which may be causing her blood pressure to be elevated.  Continue carvedilol 6.25 mg p.o. twice daily.  Continue hydralazine 25 mg p.o. 3 times daily.  Continue HCTZ 25 mg p.o. daily.  Continue potassium supplementation  3. Hyperlipidemia with target low density lipoprotein (LDL) cholesterol less than 70 mg/dL Continue Crestor 40 mg daily.  She states the last time she had her lipids drawn they were normal at PCP office.  4. Palpitations States palpitations are much better.  She is taking diltiazem 30 mg every other day for palpitations  Medication Adjustments/Labs and Tests Ordered: Current medicines are reviewed at length with the patient today.  Concerns regarding medicines are outlined above.   Disposition: Follow-up with Dr. Percival Spanish or APP 6 months  Signed, Levell July, NP 12/20/2020 8:32 AM    [facility], [place], [place] [address] Phone: [phone]; Fax: [phone]

## 2020-12-20 ENCOUNTER — Other Ambulatory Visit: Payer: Self-pay

## 2020-12-20 ENCOUNTER — Encounter: Payer: Self-pay | Admitting: Family Medicine

## 2020-12-20 ENCOUNTER — Ambulatory Visit (INDEPENDENT_AMBULATORY_CARE_PROVIDER_SITE_OTHER): Payer: BC Managed Care – PPO | Admitting: Family Medicine

## 2020-12-20 VITALS — BP 150/68 | HR 70 | Ht 63.0 in | Wt 162.0 lb

## 2020-12-20 DIAGNOSIS — I10 Essential (primary) hypertension: Secondary | ICD-10-CM | POA: Diagnosis not present

## 2020-12-20 DIAGNOSIS — I2510 Atherosclerotic heart disease of native coronary artery without angina pectoris: Secondary | ICD-10-CM | POA: Diagnosis not present

## 2020-12-20 DIAGNOSIS — R002 Palpitations: Secondary | ICD-10-CM

## 2020-12-20 DIAGNOSIS — E785 Hyperlipidemia, unspecified: Secondary | ICD-10-CM | POA: Diagnosis not present

## 2020-12-20 NOTE — Patient Instructions (Signed)
Your physician recommends that you schedule a follow-up appointment in: Diane Parker  Your physician recommends that you continue on your current medications as directed. Please refer to the Current Medication list given to you today.  Thank you for choosing [facility]!!

## 2021-02-17 ENCOUNTER — Other Ambulatory Visit: Payer: Self-pay | Admitting: *Deleted

## 2021-04-22 ENCOUNTER — Encounter: Payer: Self-pay | Admitting: Adult Health

## 2021-04-22 ENCOUNTER — Other Ambulatory Visit: Payer: Self-pay

## 2021-04-22 ENCOUNTER — Other Ambulatory Visit (HOSPITAL_COMMUNITY)
Admission: RE | Admit: 2021-04-22 | Discharge: 2021-04-22 | Disposition: A | Discharge status: Home / Self Care | Payer: BC Managed Care – PPO | Source: Ambulatory Visit | Attending: Adult Health | Admitting: Adult Health

## 2021-04-22 ENCOUNTER — Ambulatory Visit (INDEPENDENT_AMBULATORY_CARE_PROVIDER_SITE_OTHER): Payer: BC Managed Care – PPO | Admitting: Adult Health

## 2021-04-22 VITALS — BP 136/74 | HR 77 | Ht 63.25 in | Wt 173.0 lb

## 2021-04-22 DIAGNOSIS — Z01419 Encounter for gynecological examination (general) (routine) without abnormal findings: Secondary | ICD-10-CM

## 2021-04-22 DIAGNOSIS — Z79818 Long term (current) use of other agents affecting estrogen receptors and estrogen levels: Secondary | ICD-10-CM

## 2021-04-22 DIAGNOSIS — N6452 Nipple discharge: Secondary | ICD-10-CM

## 2021-04-22 DIAGNOSIS — R232 Flushing: Secondary | ICD-10-CM

## 2021-04-22 DIAGNOSIS — Z79899 Other long term (current) drug therapy: Secondary | ICD-10-CM

## 2021-04-22 DIAGNOSIS — Z1211 Encounter for screening for malignant neoplasm of colon: Secondary | ICD-10-CM | POA: Diagnosis not present

## 2021-04-22 DIAGNOSIS — Z90710 Acquired absence of both cervix and uterus: Secondary | ICD-10-CM

## 2021-04-22 LAB — HEMOCCULT GUIAC POC 1CARD (OFFICE): Fecal Occult Blood, POC: NEGATIVE

## 2021-04-22 MED ORDER — ESTRADIOL 2 MG PO TABS: 2.0000 mg | ORAL_TABLET | Freq: Every day | ORAL | 6 refills | Status: DC

## 2021-04-22 MED ORDER — SERTRALINE HCL 25 MG PO TABS: 25.0000 mg | ORAL_TABLET | Freq: Every day | ORAL | 4 refills | Status: DC | PRN

## 2021-04-22 NOTE — Progress Notes (Signed)
Patient ID: Diane Parker, female   DOB: 1956/08/04, 65 y.o.   MRN: [id number] History of Present Illness: Diane Parker is a 65 year old black female,single, sp hysterectomy in for a well woman gyn exam. 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 PCP is Dr Gerarda Fraction.     Current Medications, Allergies, Past Medical History, Past Surgical History, Family History and Social History were reviewed in [facility] record.     Review of Systems: Patient denies any headaches, hearing loss, fatigue, blurred vision, shortness of breath, chest pain, abdominal pain, problems with bowel movements, urination, or intercourse(not often). No joint pain or mood swings.  +Hot flashes Has had breast discharge left breat since April last year after second COVID vaccine, when breasts swelled and right arm was bluish, has seen PCP   Physical Exam:BP 136/74 (BP Location: Left Arm, Patient Position: Sitting, Cuff Size: Normal)   Pulse 77   Ht 5' 3.25" (1.607 m)   Wt 173 lb (78.5 kg)   BMI 30.40 kg/m   General:  Well developed, well nourished, no acute distress Skin:  Warm and dry Neck:  Midline trachea, normal thyroid, good ROM, no lymphadenopathy,no carotid bruits heard Lungs; Clear to auscultation bilaterally Breast:  No dominant palpable mass, retraction, or nipple discharge, on the right, on the left, no masses,or retractions, but has clear nipple discharge 1 site when pressed, slid obtained  Cardiovascular: Regular rate and rhythm Abdomen:  Soft, non tender, no hepatosplenomegaly Pelvic:  External genitalia is normal in appearance, no lesions.  The vagina is normal in appearance. Urethra has no lesions or masses. The cervix and uterus are absent. No adnexal masses or tenderness noted.Bladder is non tender, no masses felt. Rectal: Good sphincter tone, no polyps, or hemorrhoids felt.  Hemoccult negative. Extremities/musculoskeletal:  No swelling or varicosities noted, no clubbing  or cyanosis Psych:  No mood changes, alert and cooperative,seems happy AA is 4 Fall risk is moderate Depression screen [facility] 2/9 04/22/2021 04/21/2020  Decreased Interest 0 0  Down, Depressed, Hopeless 0 0  PHQ - 2 Score 0 0  Altered sleeping 3 3  Tired, decreased energy 1 3  Change in appetite 0 0  Feeling bad or failure about yourself  0 0  Trouble concentrating 0 0  Moving slowly or fidgety/restless 0 0  Suicidal thoughts 0 0  PHQ-9 Score 4 6  Difficult doing work/chores - Not difficult at all    GAD 7 : Generalized Anxiety Score 04/22/2021 04/21/2020  Nervous, Anxious, on Edge 1 0  Control/stop worrying 0 0  Worry too much - different things 0 0  Trouble relaxing 1 0  Restless 0 0  Easily annoyed or irritable 0 0  Afraid - awful might happen 0 0  Total GAD 7 Score 2 0      Upstream - 04/22/21 0855       Pregnancy Intention Screening   Does the patient want to become pregnant in the next year? N/A    Does the patient's partner want to become pregnant in the next year? N/A    Would the patient like to discuss contraceptive options today? N/A      Contraception Wrap Up   Current Method Female Sterilization   hyst   End Method Female Sterilization   hyst   Contraception Counseling Provided No            Examination chaperoned by Levy Pupa LPN   Impression and Plan: 1.  Breast discharge Slide sent Check TSH and Prolactin next week when breasts not manipulated Diagnostic bilateral mammogram and [place] scheduled for her at [facility] 05/31/21 at 2 pm   2. Encounter for well woman exam with routine gynecological exam Physical in 1 year Labs with PCP Colonoscopy per GI  3. Encounter for screening fecal occult blood testing   4. S/P hysterectomy   5. Hot flashes Continue estrace and zoloft Meds ordered this encounter  Medications   estradiol (ESTRACE) 2 MG tablet    Sig: Take 1 tablet (2 mg total) by mouth daily.    Dispense:  30 tablet    Refill:  6    Order  Specific Question:   Supervising Provider    Answer:   Tania Ade H [[id number]]   sertraline (ZOLOFT) 25 MG tablet    Sig: Take 1 tablet (25 mg total) by mouth daily as needed (Hot Flashes).    Dispense:  90 tablet    Refill:  4    Order Specific Question:   Supervising Provider    Answer:   Elonda Husky, LUTHER H [[id number]]      6. Current use of estrogen therapy Continue estrace,she takes every other day she says

## 2021-04-25 LAB — CYTOLOGY - NON PAP

## 2021-04-26 ENCOUNTER — Telehealth: Payer: Self-pay

## 2021-04-26 NOTE — Telephone Encounter (Signed)
-----   Message from Estill Dooms, NP sent at 04/26/2021 12:32 PM EDT ----- Let pt know no malignancy on nipple discharge slide, and go get labs TSH and prolactin done ... THX

## 2021-04-26 NOTE — Telephone Encounter (Signed)
Called pt to relay test results, no answer, left vm per [facility]

## 2021-05-02 DIAGNOSIS — N6452 Nipple discharge: Secondary | ICD-10-CM | POA: Diagnosis not present

## 2021-05-03 ENCOUNTER — Telehealth: Payer: Self-pay

## 2021-05-03 DIAGNOSIS — I10 Essential (primary) hypertension: Secondary | ICD-10-CM | POA: Diagnosis not present

## 2021-05-03 DIAGNOSIS — D649 Anemia, unspecified: Secondary | ICD-10-CM | POA: Diagnosis not present

## 2021-05-03 DIAGNOSIS — Z79899 Other long term (current) drug therapy: Secondary | ICD-10-CM | POA: Diagnosis not present

## 2021-05-03 DIAGNOSIS — Z13220 Encounter for screening for lipoid disorders: Secondary | ICD-10-CM | POA: Diagnosis not present

## 2021-05-03 DIAGNOSIS — Z1389 Encounter for screening for other disorder: Secondary | ICD-10-CM | POA: Diagnosis not present

## 2021-05-03 LAB — TSH: TSH: 2.3 u[IU]/mL (ref 0.450–4.500)

## 2021-05-03 LAB — PROLACTIN: Prolactin: 18 ng/mL (ref 4.8–23.3)

## 2021-05-03 NOTE — Telephone Encounter (Signed)
Called pt to relay test results per Derrek Monaco. Pt confirmed understanding.

## 2021-05-13 DIAGNOSIS — D649 Anemia, unspecified: Secondary | ICD-10-CM | POA: Diagnosis not present

## 2021-05-31 ENCOUNTER — Other Ambulatory Visit: Payer: Self-pay

## 2021-05-31 ENCOUNTER — Ambulatory Visit (HOSPITAL_COMMUNITY)
Admission: RE | Admit: 2021-05-31 | Discharge: 2021-05-31 | Disposition: A | Discharge status: Home / Self Care | Payer: BC Managed Care – PPO | Source: Ambulatory Visit | Attending: Adult Health | Admitting: Adult Health

## 2021-05-31 ENCOUNTER — Other Ambulatory Visit (HOSPITAL_COMMUNITY): Payer: Self-pay | Admitting: Adult Health

## 2021-05-31 ENCOUNTER — Encounter (HOSPITAL_COMMUNITY): Payer: Self-pay

## 2021-05-31 DIAGNOSIS — N6452 Nipple discharge: Secondary | ICD-10-CM

## 2021-05-31 DIAGNOSIS — R922 Inconclusive mammogram: Secondary | ICD-10-CM | POA: Diagnosis not present

## 2021-05-31 DIAGNOSIS — N6489 Other specified disorders of breast: Secondary | ICD-10-CM | POA: Diagnosis not present

## 2021-05-31 DIAGNOSIS — R928 Other abnormal and inconclusive findings on diagnostic imaging of breast: Secondary | ICD-10-CM

## 2021-05-31 IMAGING — MG DIGITAL DIAGNOSTIC BILAT W/ TOMO W/ CAD
6 of 10 series · 6 of 30 positions shown · non-contrast
Comparison: Previous exams.

CLINICAL DATA: 64-year-old female with spontaneous unilateral clear
left discharge possibly from a single duct. Onset was approximally 1
year prior following COVID vaccination.



[L CC synth-2D (1 of 2)]
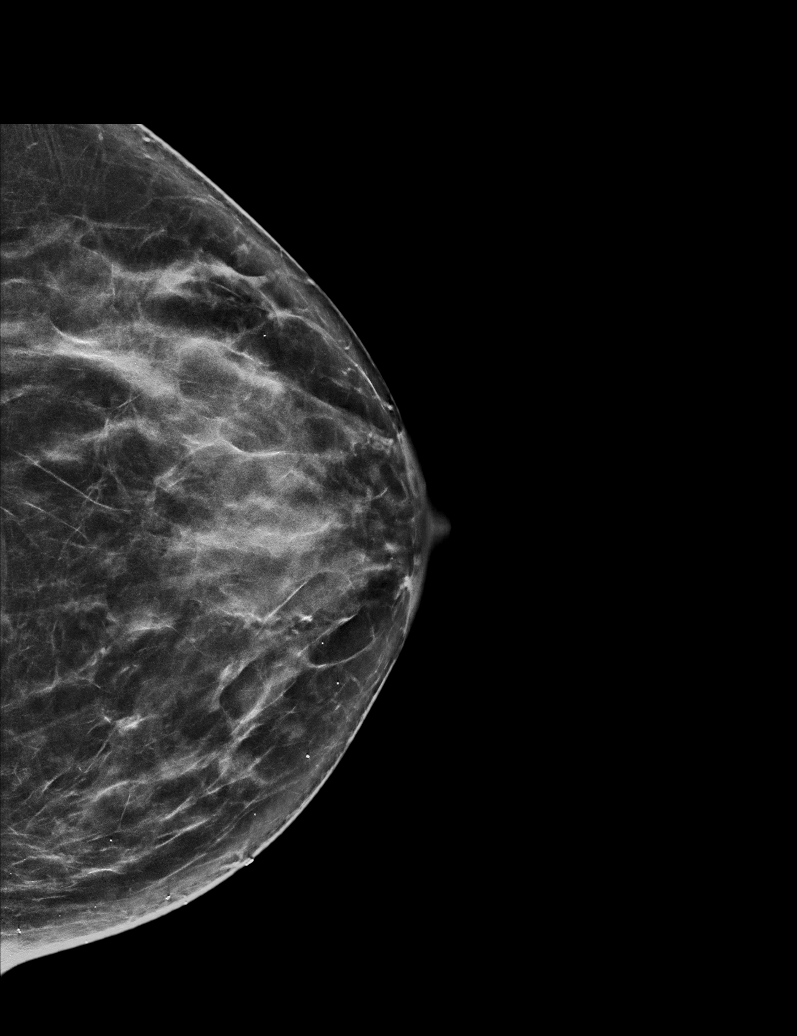

[L CC synth-2D (2 of 2)]
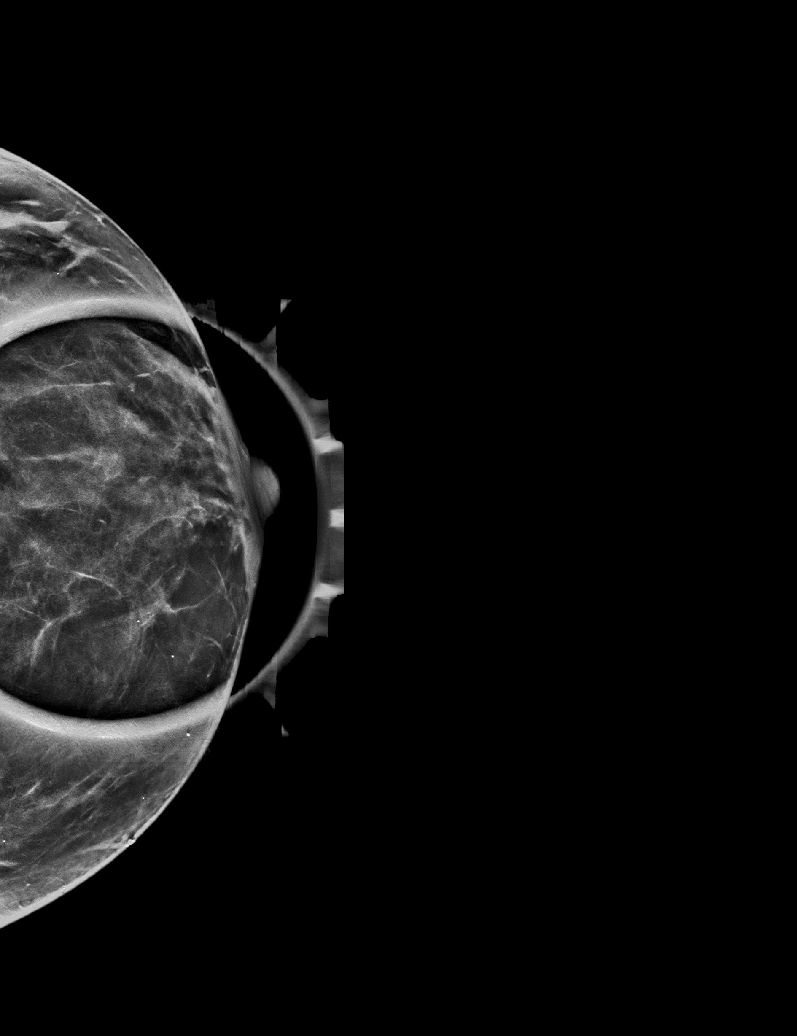

[L MLO synth-2D]
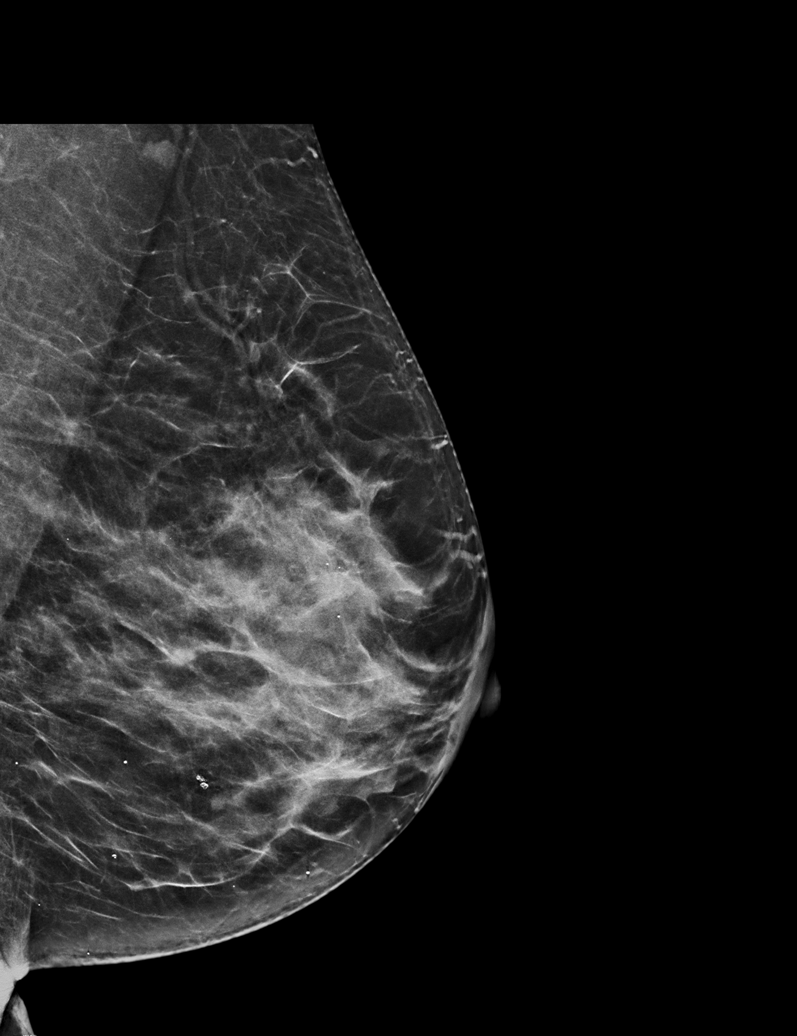

[R CC synth-2D]
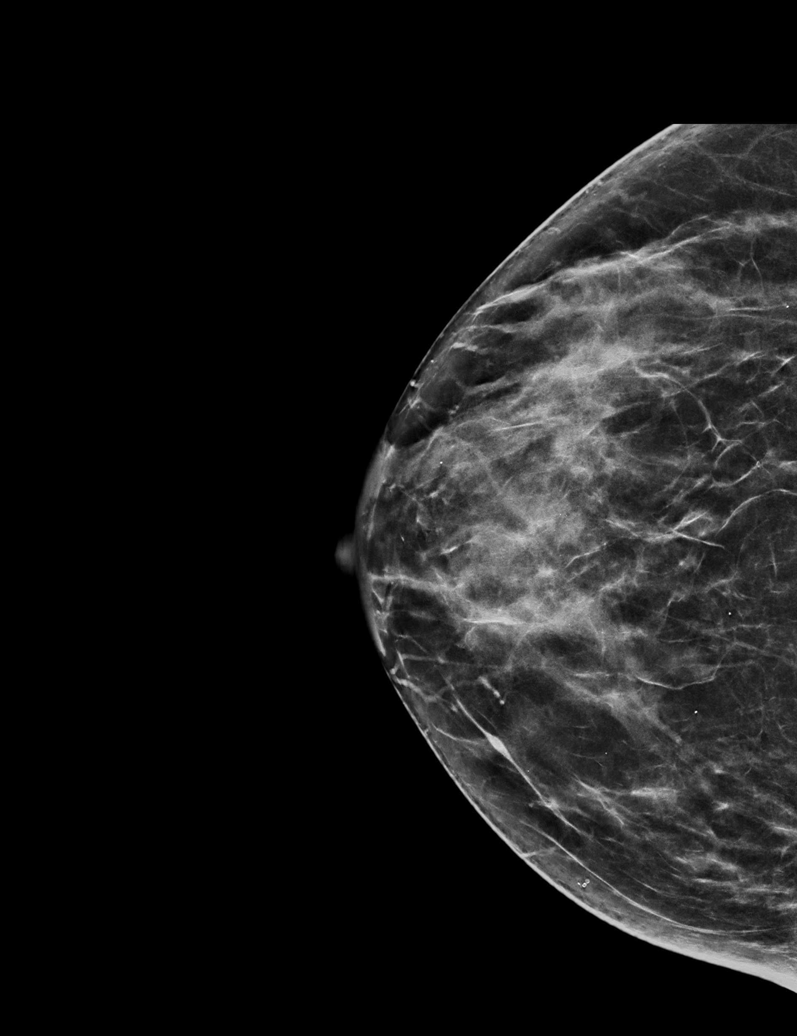

[R MLO synth-2D]
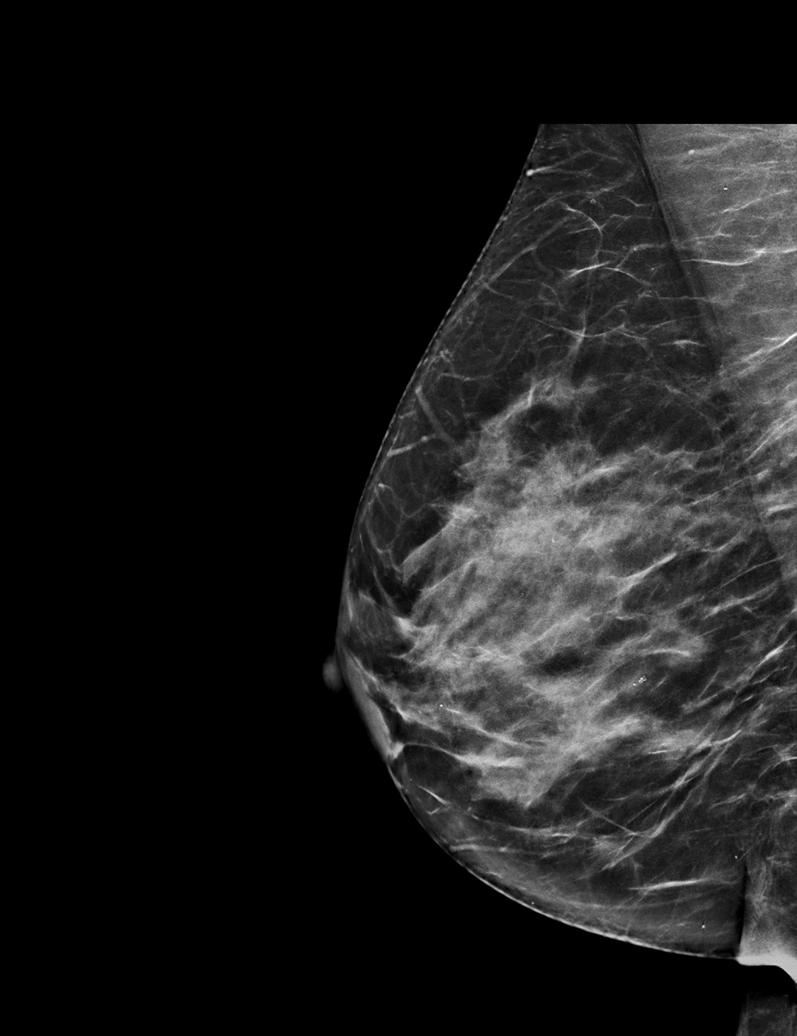

[L CC tomo · tomo slice 35/68.0]
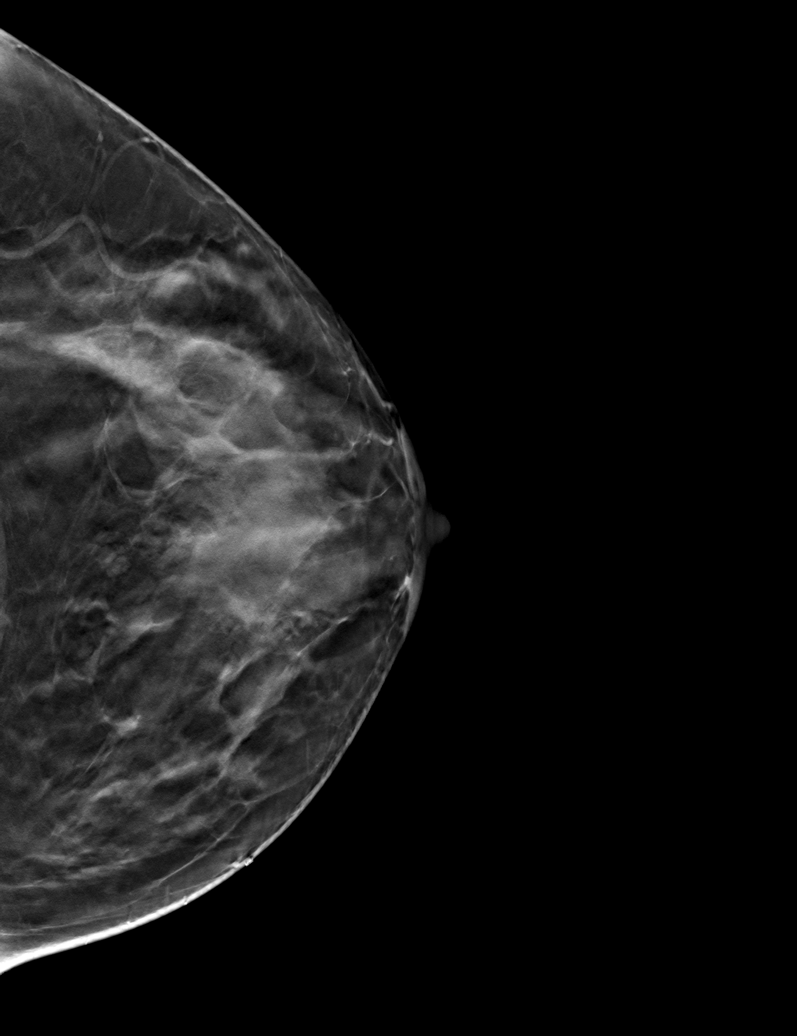

[6 of 30 positions shown; findings below may reference images not displayed]

ACR Breast Density Category c: The breast tissue is heterogeneously
dense, which may obscure small masses.
FINDINGS: There is an circumscribed mass in the central posterior right breast
measures 0.6 cm. Spot compression tomograms were performed over the
retroareolar left breast with no definite abnormality seen.

Targeted ultrasound of the central right breast was performed
demonstrating several areas of fibrocystic change. A cyst at 12
o'clock periareolar measures 0.6 x 0.3 x 0.5 cm. This likely
corresponds well with the mass seen in the right breast at
mammography.

Targeted ultrasound of the left breast was performed. There is an
irregular hypoechoic mass/area in the left breast at the [DATE]
position 4 cm from nipple measuring 1.6 x 0.8 x 1.1 cm. This may
represent an area of fibrocystic change with associated
dense/shadowing fibroglandular tissue however the appearance is
indeterminate. Several additional areas of fibrocystic change are
identified in the upper-outer left breast. No abnormality seen in
the immediate retroareolar left breast. No lymphadenopathy seen in
the left axilla.
IMPRESSION: Indeterminate 1.6 cm mass/shadowing area in the left breast at the
[DATE] position 4 cm from nipple.

RECOMMENDATION:
1. Recommend ultrasound-guided biopsy of the mass in the left breast
at the [DATE] position.

2. If pathology from the biopsied left breast mass does not provide
reason for left breast nipple discharge, then further evaluation
with bilateral breast MRI would be recommended given history of
spontaneous clear unilateral and possible unit ductal left nipple
discharge.

I have discussed the findings and recommendations with the patient.
If applicable, a reminder letter will be sent to the patient
regarding the next appointment.

BI-RADS CATEGORY  4: Suspicious.

## 2021-05-31 IMAGING — US US BREAST*L* LIMITED INC AXILLA
1 series · 13 of 25 positions shown · non-contrast
Comparison: Previous exams.

CLINICAL DATA: 64-year-old female with spontaneous unilateral clear
left discharge possibly from a single duct. Onset was approximally 1
year prior following COVID vaccination.



[Series 1: us breast*left* limited inc axilla · 0.07mm/px · 13 of 26 slices shown]
[im 1/26]
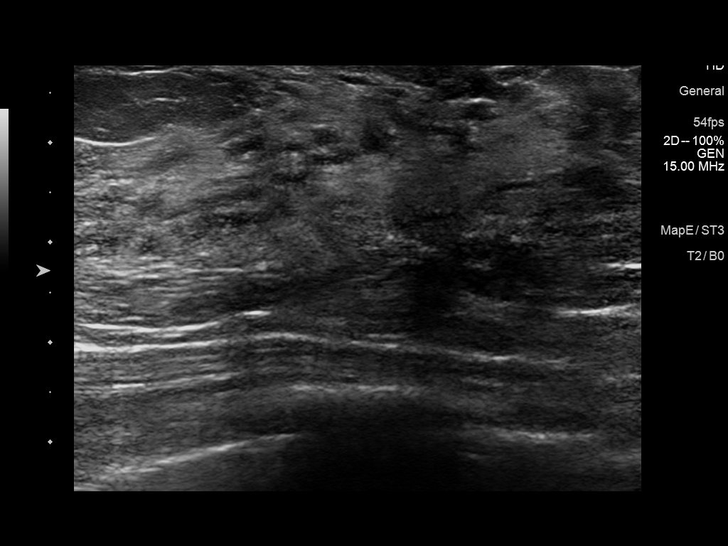
[im 3/26]
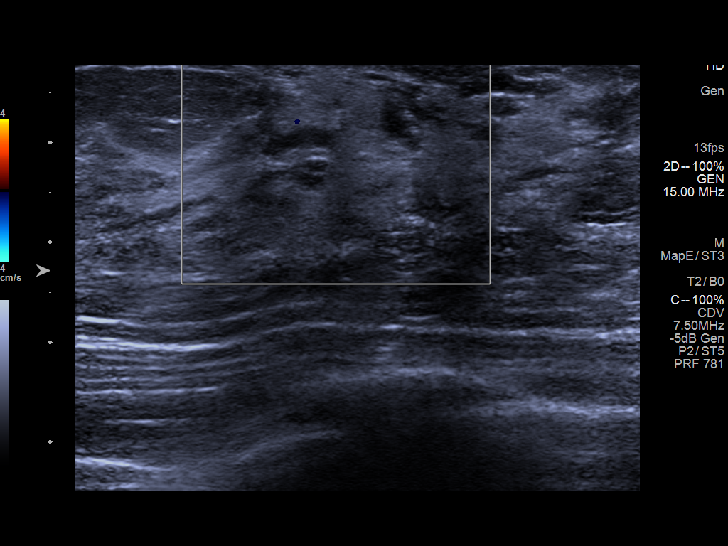
[im 5/26]
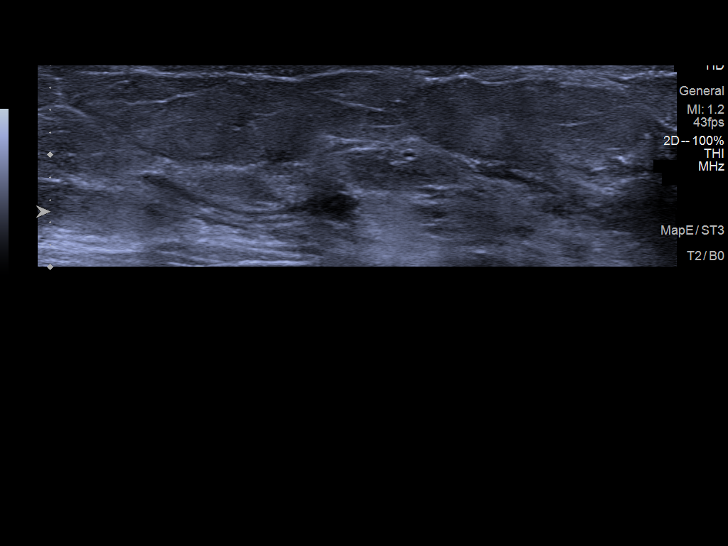
[im 7/26]
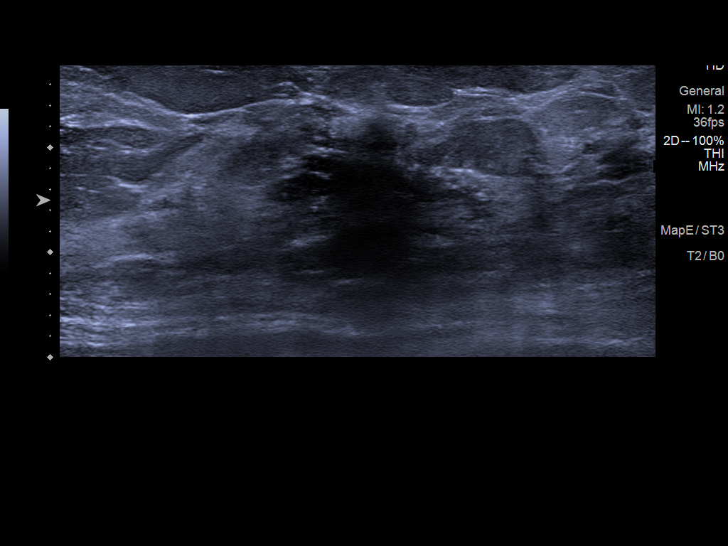
[im 9/26]
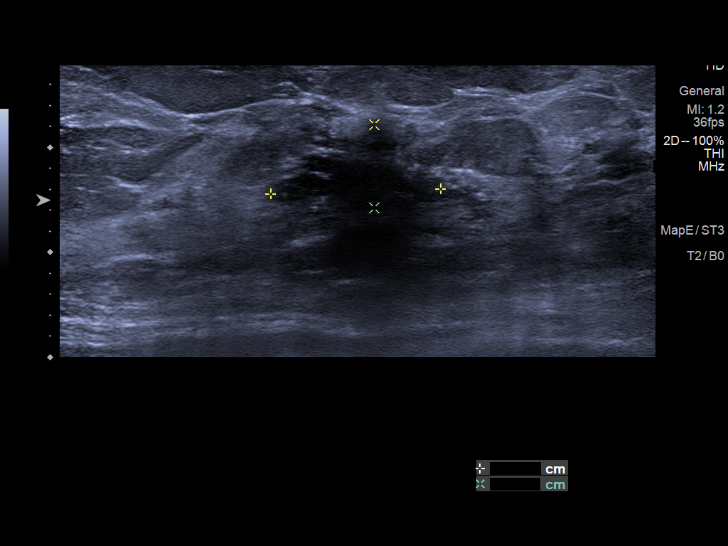
[im 11/26]
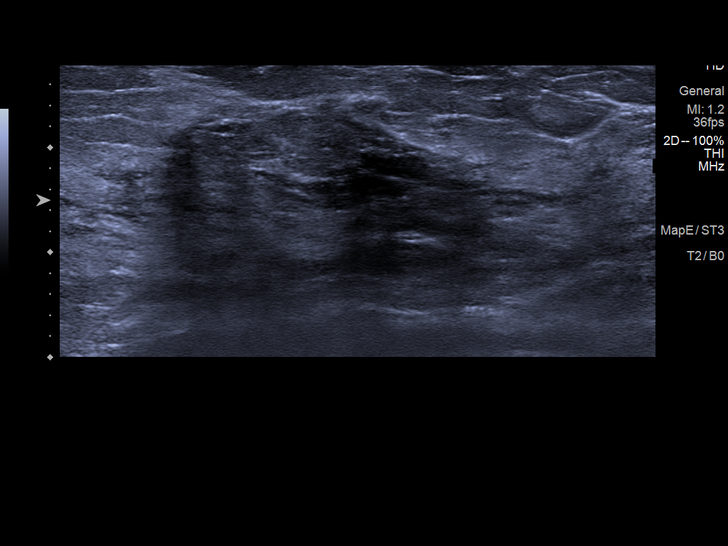
[im 13/26]
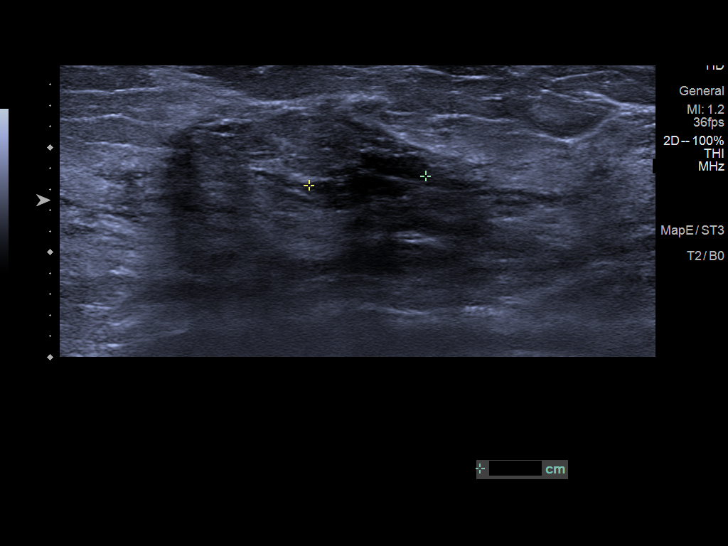
[im 15/26]
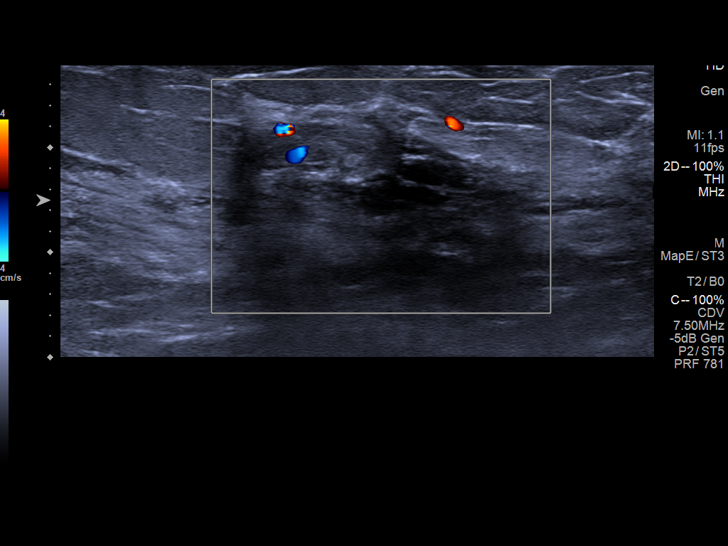
[im 17/26]
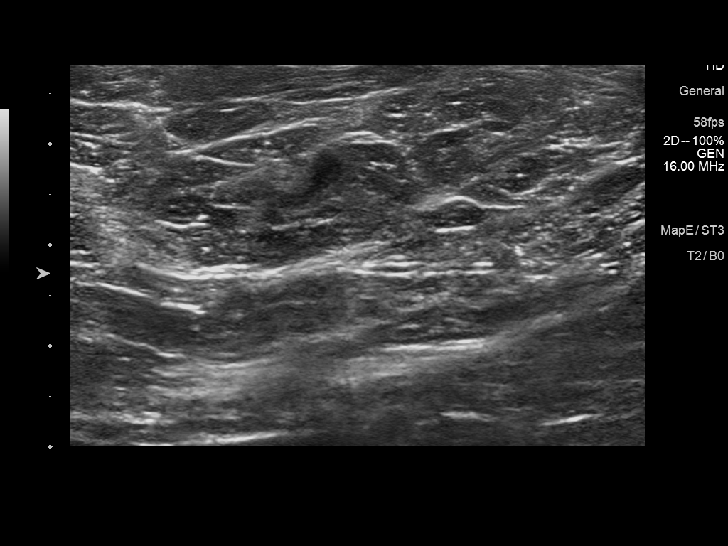
[im 19/26]
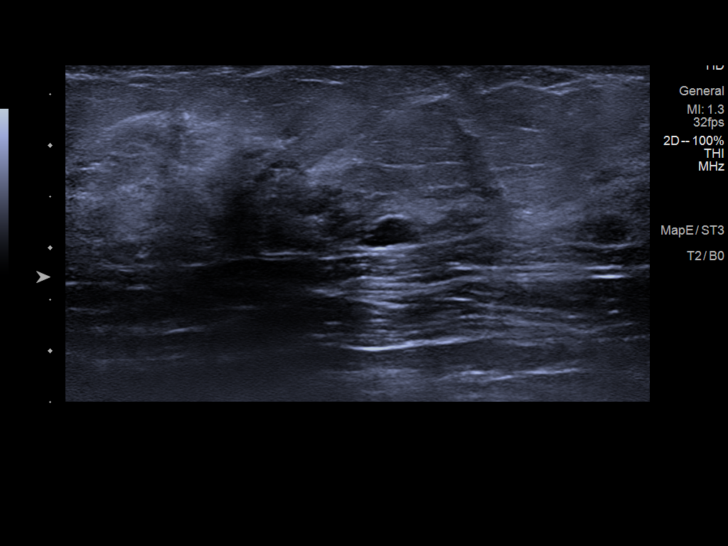
[im 21/26]
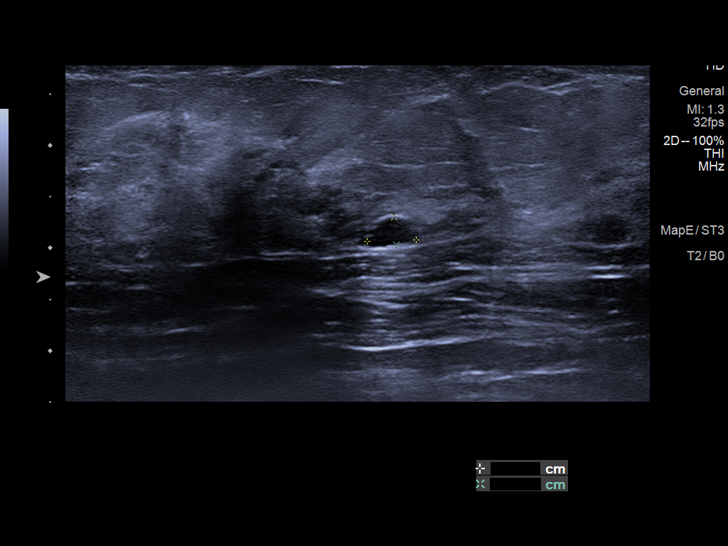
[im 23/26]
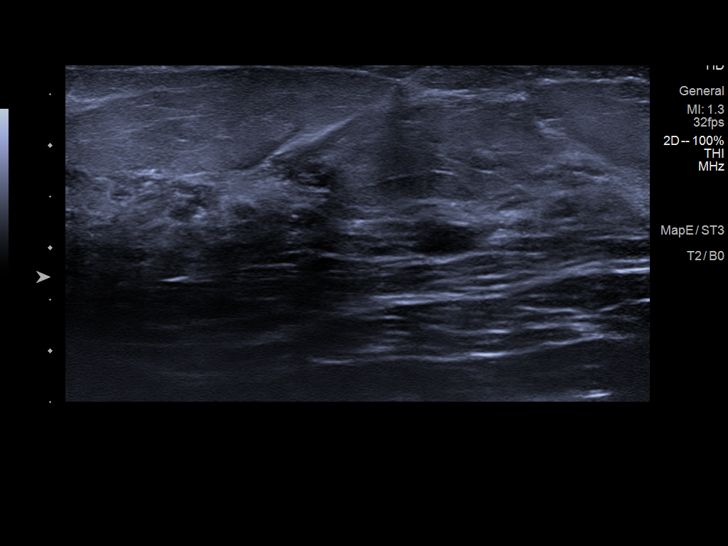
[im 26/26]
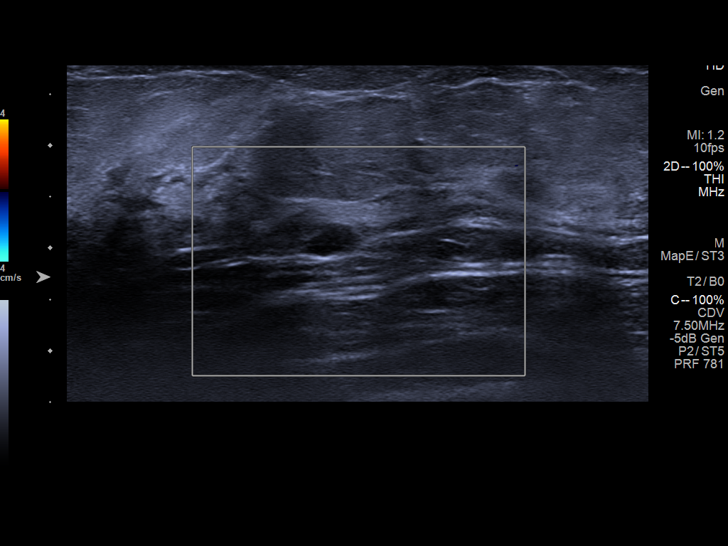

[13 of 25 positions shown; findings below may reference images not displayed]

ACR Breast Density Category c: The breast tissue is heterogeneously
dense, which may obscure small masses.
FINDINGS: There is an circumscribed mass in the central posterior right breast
measures 0.6 cm. Spot compression tomograms were performed over the
retroareolar left breast with no definite abnormality seen.

Targeted ultrasound of the central right breast was performed
demonstrating several areas of fibrocystic change. A cyst at 12
o'clock periareolar measures 0.6 x 0.3 x 0.5 cm. This likely
corresponds well with the mass seen in the right breast at
mammography.

Targeted ultrasound of the left breast was performed. There is an
irregular hypoechoic mass/area in the left breast at the [DATE]
position 4 cm from nipple measuring 1.6 x 0.8 x 1.1 cm. This may
represent an area of fibrocystic change with associated
dense/shadowing fibroglandular tissue however the appearance is
indeterminate. Several additional areas of fibrocystic change are
identified in the upper-outer left breast. No abnormality seen in
the immediate retroareolar left breast. No lymphadenopathy seen in
the left axilla.
IMPRESSION: Indeterminate 1.6 cm mass/shadowing area in the left breast at the
[DATE] position 4 cm from nipple.

RECOMMENDATION:
1. Recommend ultrasound-guided biopsy of the mass in the left breast
at the [DATE] position.

2. If pathology from the biopsied left breast mass does not provide
reason for left breast nipple discharge, then further evaluation
with bilateral breast MRI would be recommended given history of
spontaneous clear unilateral and possible unit ductal left nipple
discharge.

I have discussed the findings and recommendations with the patient.
If applicable, a reminder letter will be sent to the patient
regarding the next appointment.

BI-RADS CATEGORY  4: Suspicious.

## 2021-06-02 DIAGNOSIS — M2550 Pain in unspecified joint: Secondary | ICD-10-CM | POA: Diagnosis not present

## 2021-06-02 DIAGNOSIS — K219 Gastro-esophageal reflux disease without esophagitis: Secondary | ICD-10-CM | POA: Diagnosis not present

## 2021-06-02 DIAGNOSIS — Z23 Encounter for immunization: Secondary | ICD-10-CM | POA: Diagnosis not present

## 2021-06-02 DIAGNOSIS — I10 Essential (primary) hypertension: Secondary | ICD-10-CM | POA: Diagnosis not present

## 2021-06-02 DIAGNOSIS — M1991 Primary osteoarthritis, unspecified site: Secondary | ICD-10-CM | POA: Diagnosis not present

## 2021-06-04 HISTORY — PX: BREAST BIOPSY: SHX20

## 2021-06-14 ENCOUNTER — Other Ambulatory Visit: Payer: Self-pay

## 2021-06-14 ENCOUNTER — Encounter (HOSPITAL_COMMUNITY): Payer: Self-pay

## 2021-06-14 ENCOUNTER — Ambulatory Visit (HOSPITAL_COMMUNITY)
Admission: RE | Admit: 2021-06-14 | Discharge: 2021-06-14 | Disposition: A | Discharge status: Home / Self Care | Payer: BC Managed Care – PPO | Source: Ambulatory Visit | Attending: Adult Health | Admitting: Adult Health

## 2021-06-14 ENCOUNTER — Other Ambulatory Visit (HOSPITAL_COMMUNITY): Payer: Self-pay | Admitting: Adult Health

## 2021-06-14 DIAGNOSIS — R928 Other abnormal and inconclusive findings on diagnostic imaging of breast: Secondary | ICD-10-CM | POA: Diagnosis not present

## 2021-06-14 DIAGNOSIS — N6321 Unspecified lump in the left breast, upper outer quadrant: Secondary | ICD-10-CM | POA: Diagnosis not present

## 2021-06-14 DIAGNOSIS — N6012 Diffuse cystic mastopathy of left breast: Secondary | ICD-10-CM | POA: Diagnosis not present

## 2021-06-14 DIAGNOSIS — R921 Mammographic calcification found on diagnostic imaging of breast: Secondary | ICD-10-CM | POA: Diagnosis not present

## 2021-06-14 IMAGING — MG MM BREAST LOCALIZATION CLIP
4 series · 4 of 12 positions shown · non-contrast
Comparison: Previous exam(s).

CLINICAL DATA: Post ultrasound-guided left the mass in the left
[DATE] position.

EXAM:
3D DIAGNOSTIC LEFT MAMMOGRAM POST ULTRASOUND BIOPSY

[L CC synth-2D]
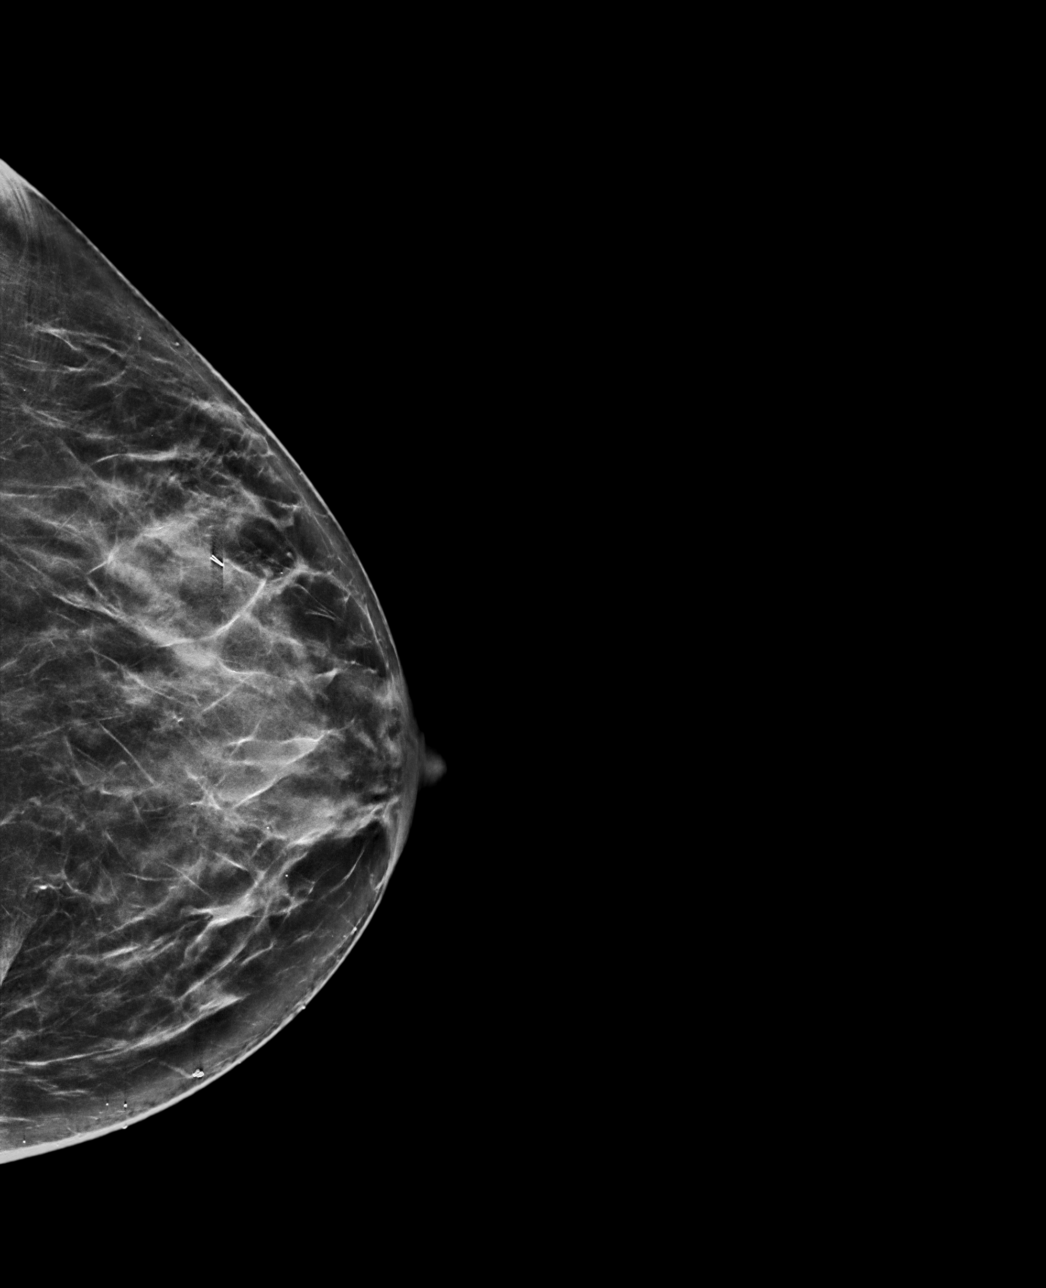

[L ML synth-2D]
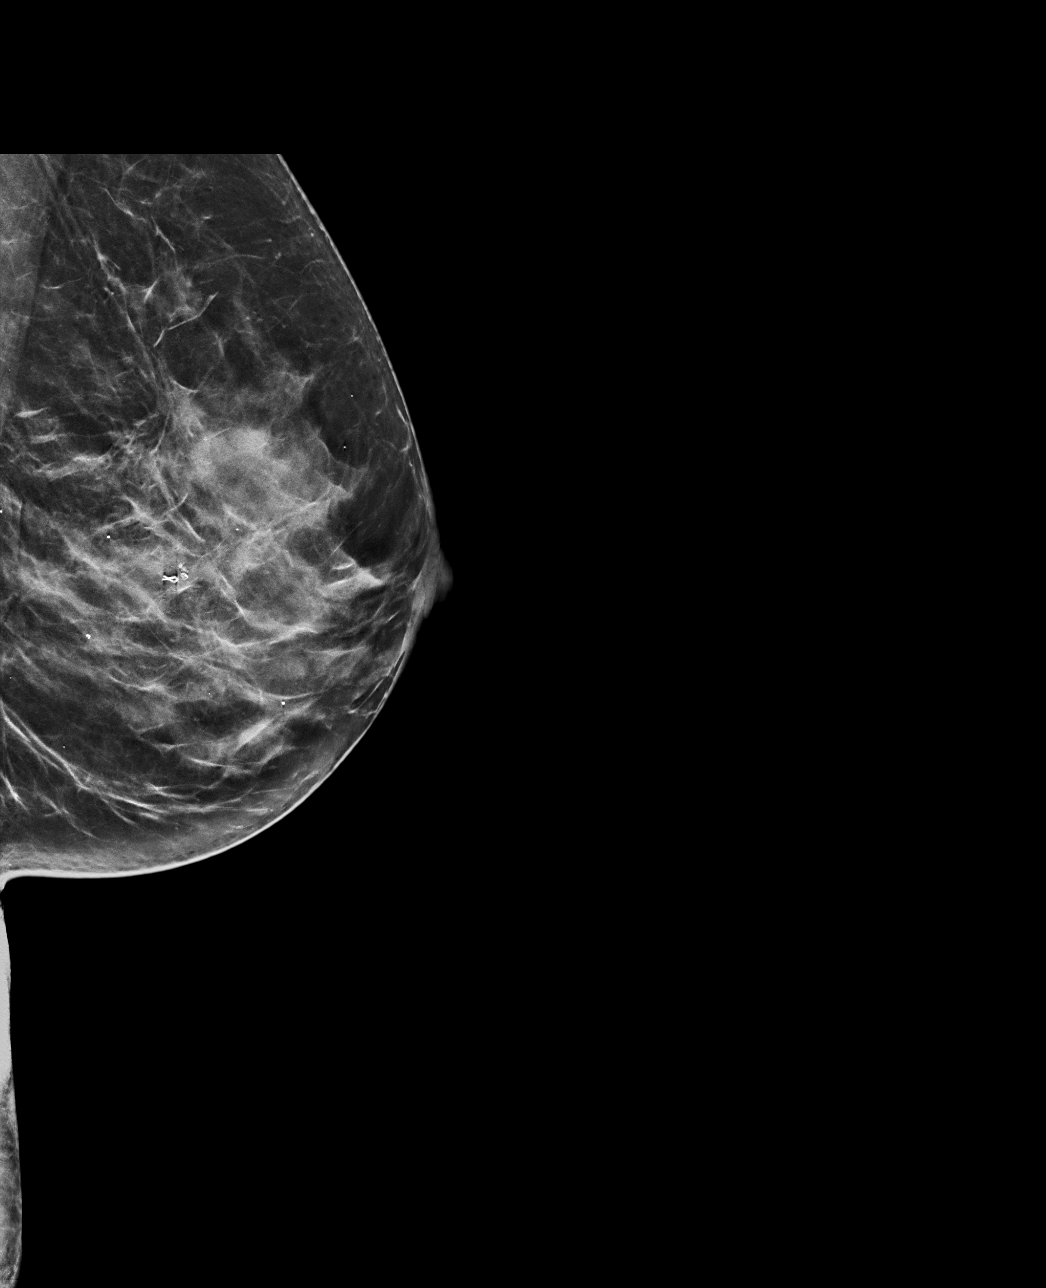

[L CC tomo · tomo slice 37/73.0]
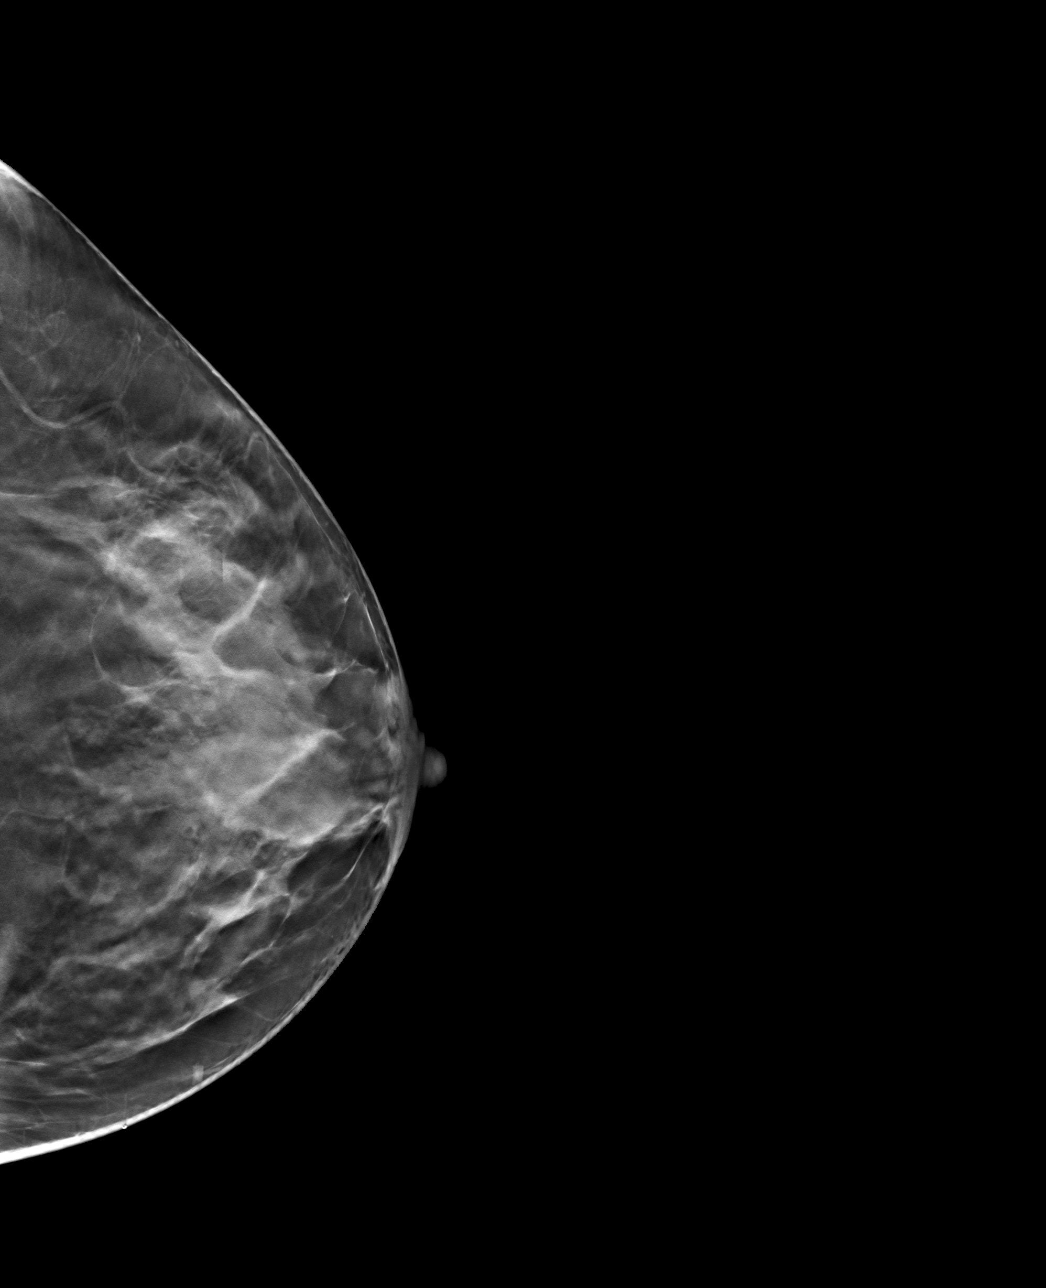

[L ML tomo · tomo slice 35/69.0]
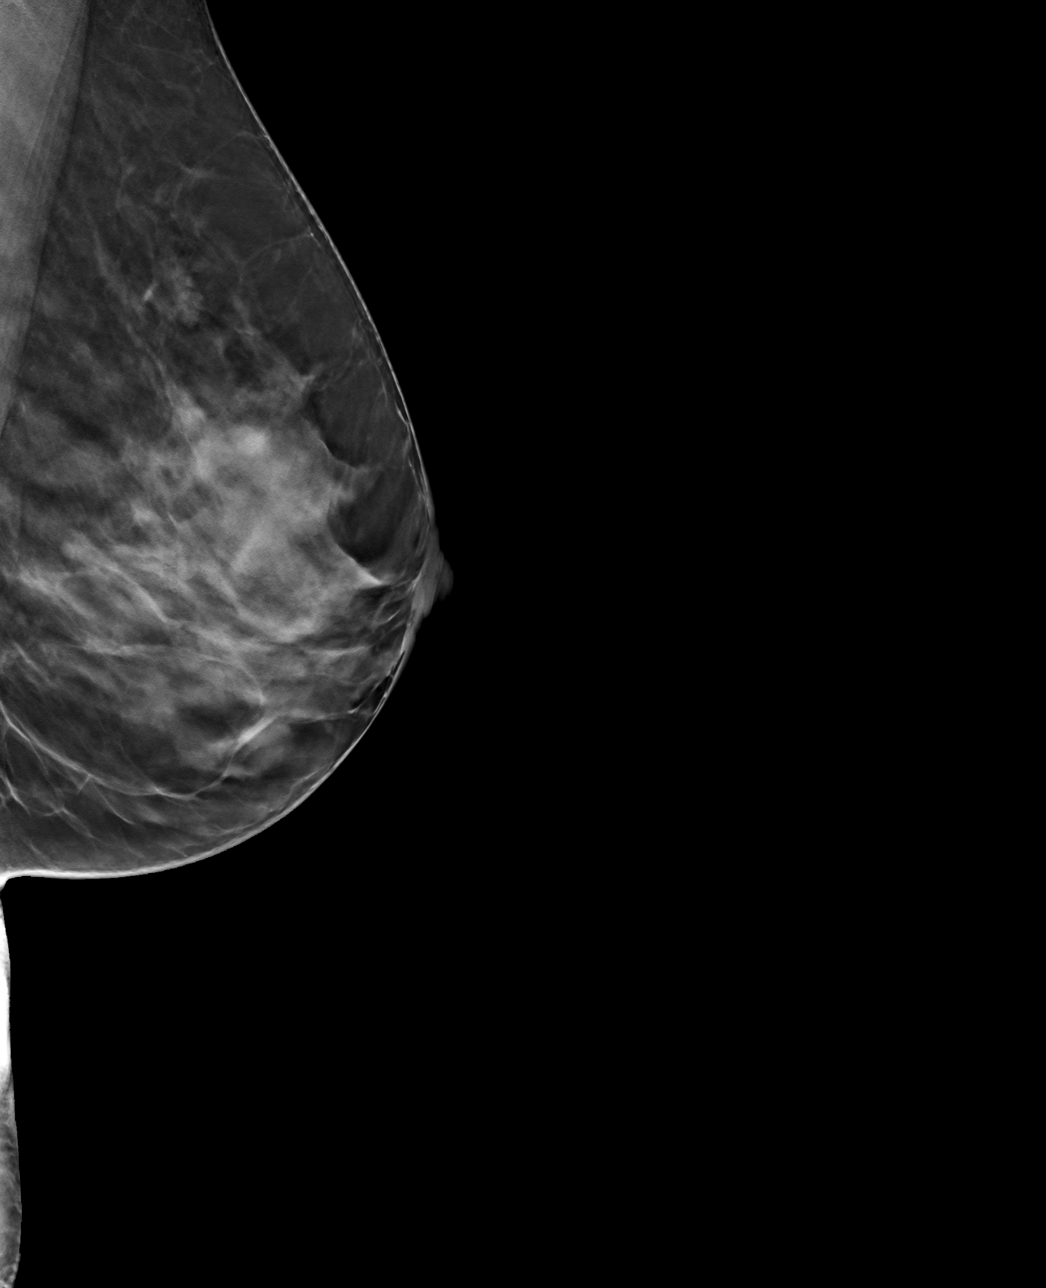

[4 of 12 positions shown; findings below may reference images not displayed]

FINDINGS: 3D Mammographic images were obtained following ultrasound guided
biopsy of a mass in the left breast at the [DATE] position. A ribbon
shaped biopsy marking clip is present at the site of the biopsied
mass in the left breast at the [DATE] position.
IMPRESSION: Ribbon shaped biopsy marking clip at site of biopsied mass in the
left breast at the [DATE] position.

Final Assessment: Post Procedure Mammograms for Marker Placement

## 2021-06-14 IMAGING — US US BREAST BX W LOC DEV 1ST LESION IMG BX SPEC US GUIDE*L*
1 series · 12 of 25 positions shown · non-contrast
Comparison: Previous exam(s).
COMPARISON: Previous exam(s).

Addendum:
CLINICAL DATA: 65-year-old female with an indeterminate 1.6 cm mass
in the left breast at the [DATE] position.

EXAM:
ULTRASOUND GUIDED LEFT BREAST CORE NEEDLE BIOPSY

[Series 1: us breast bx w loc dev 1st lesion img bx spec us g · 0.07mm/px · 12 of 33 slices shown]
[im 2/33]
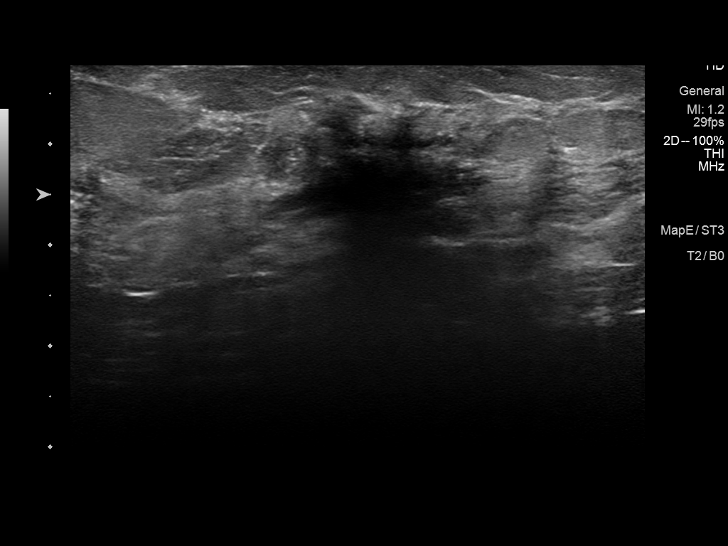
[im 5/33]
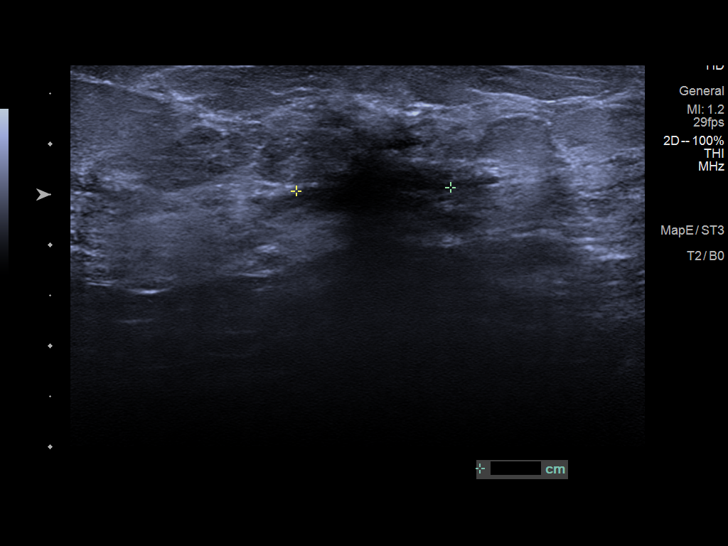
[im 7/33]
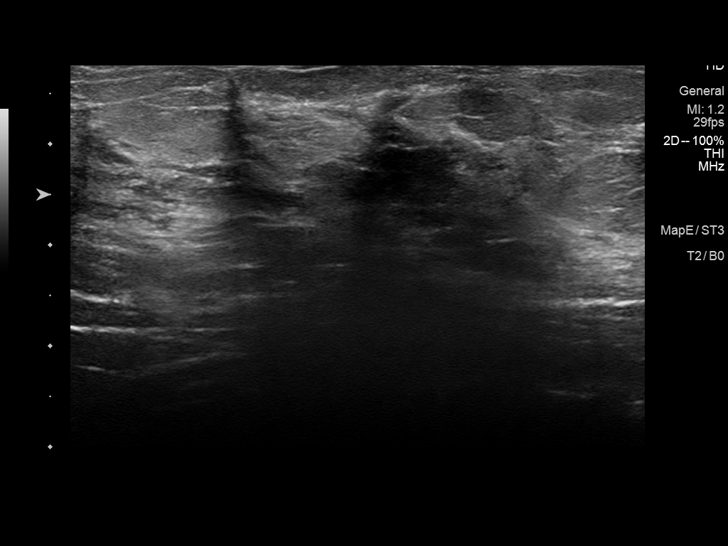
[im 10/33]
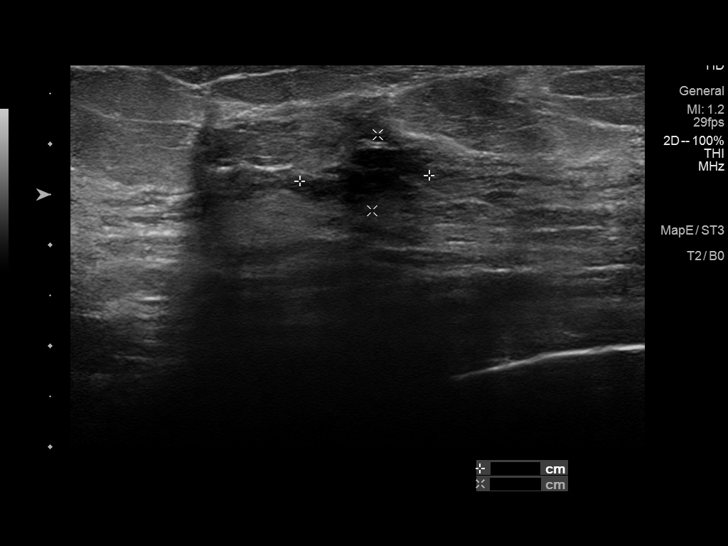
[im 13/33]
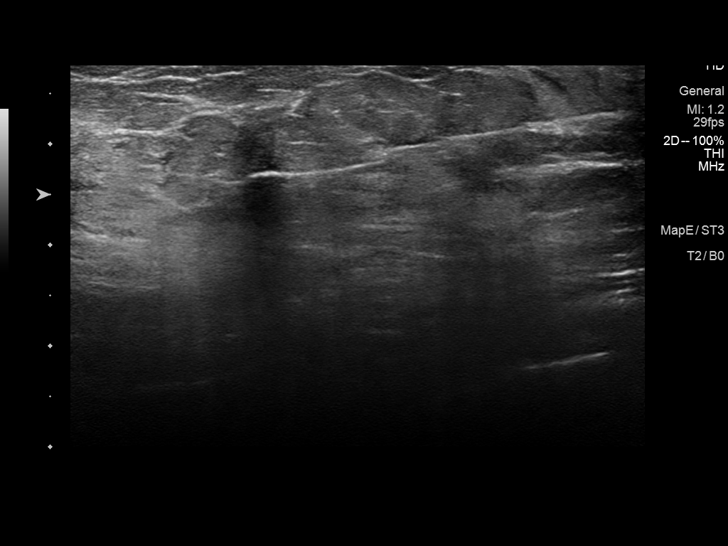
[im 15/33]
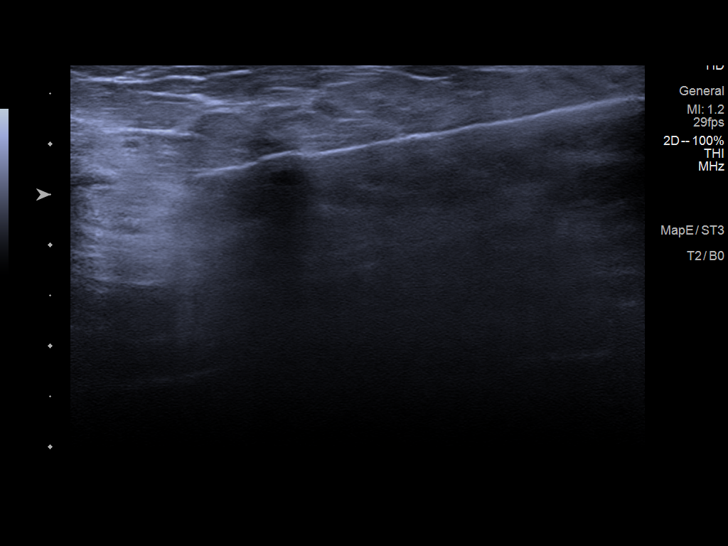
[im 18/33]
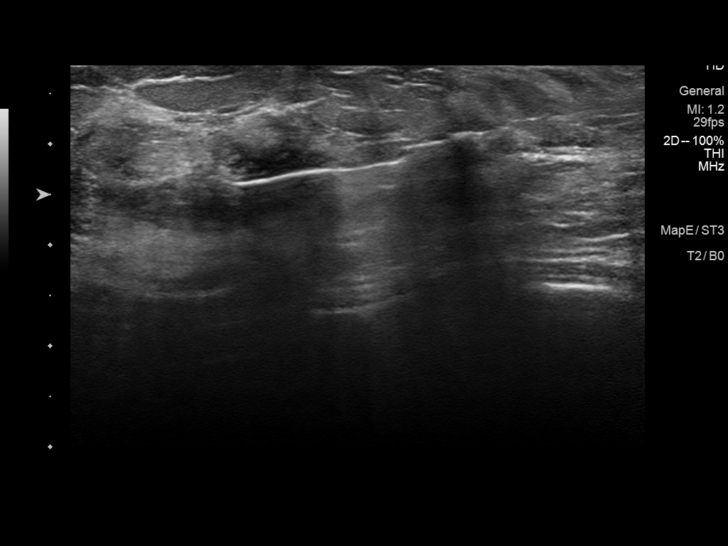
[im 21/33]
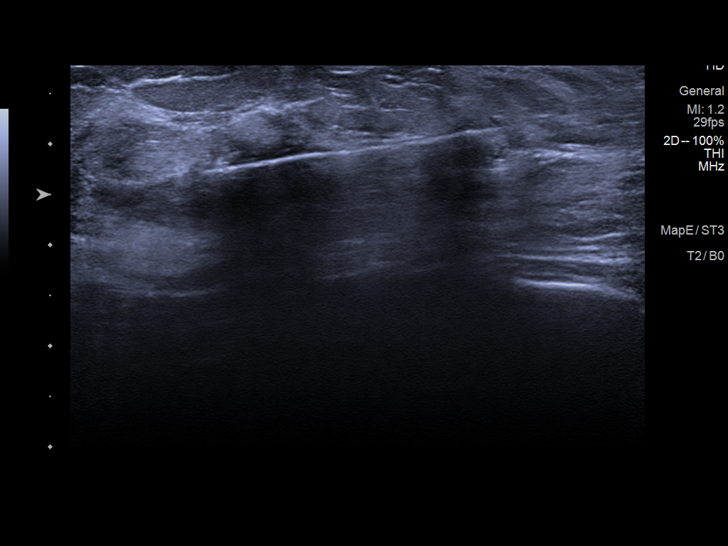
[im 23/33]
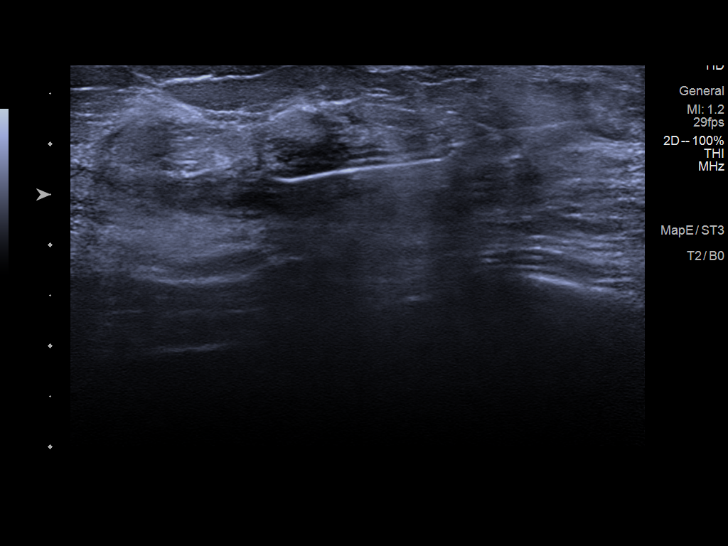
[im 26/33]
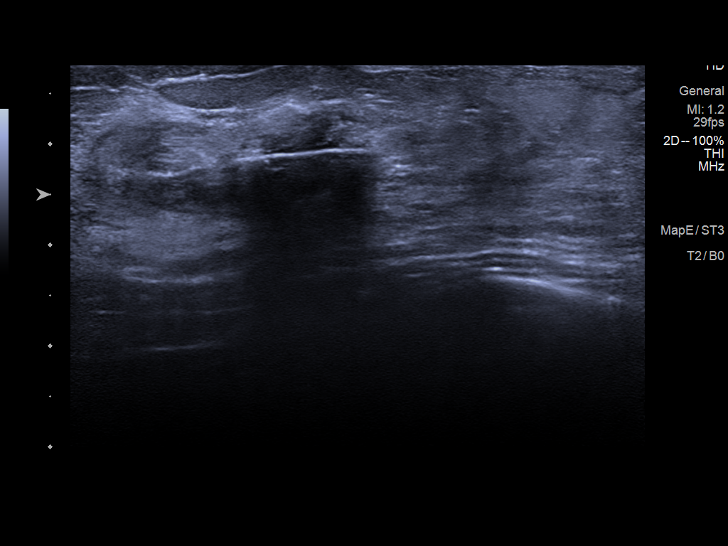
[im 29/33]
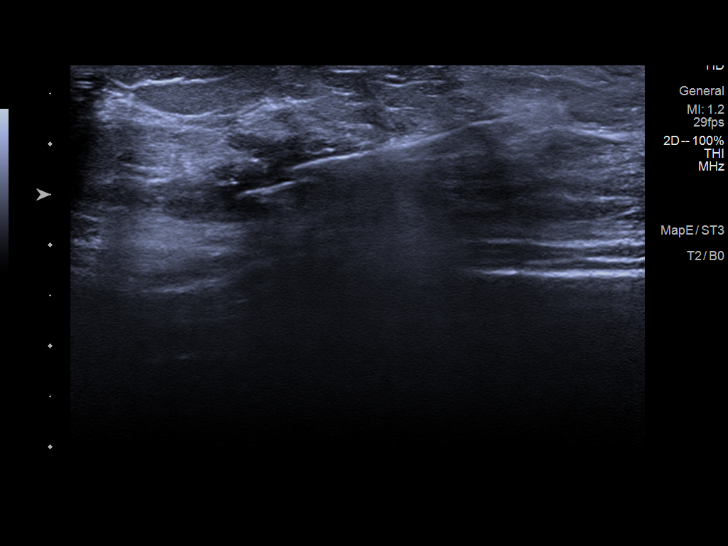
[im 31/33]
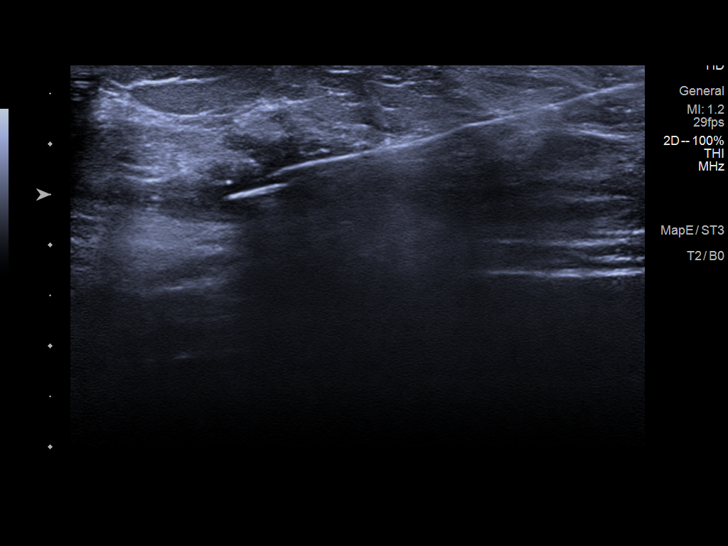

[12 of 25 positions shown; findings below may reference images not displayed]



Lesion quadrant: Upper outer

Using sterile technique and 1% Lidocaine as local anesthetic, under
direct ultrasound visualization, a 14 gauge TATTO device was
used to perform biopsy of the mass in the upper-outer left breast
using a lateral to medial approach. At the conclusion of the
procedure a ribbon shaped tissue marker clip was deployed into the
biopsy cavity. Follow up 2 view mammogram was performed and dictated
separately.
IMPRESSION: Ultrasound guided biopsy of the mass in the left breast at the [DATE]
position. No apparent complications.

ADDENDUM:
PATHOLOGY revealed: A. BREAST, LEFT, BIOPSY: - Fibrocystic changes
with calcifications. - No evidence of malignancy.

Pathology results are CONCORDANT with imaging findings, per Dr.
TATTO.

Pathology results and recommendations were discussed with patient
via telephone on [DATE]. Patient reported biopsy site doing well
with no adverse symptoms, and only slight tenderness at the site.
Post biopsy care instructions were reviewed, questions were answered
and my direct phone number was provided. Patient was instructed to
call [HOSPITAL] [HOSPITAL] Mammography Department for any
additional questions or concerns related to biopsy site.

RECOMMENDATIONS: 1. Patient instructed to continue monthly self
breast examinations and resume annual bilateral screening mammogram
due [DATE].

2. Recommend bilateral breast MRI given persistent LEFT breast clear
nipple discharge.

Pathology results reported by TATTO RN on [DATE].



Lesion quadrant: Upper outer

Using sterile technique and 1% Lidocaine as local anesthetic, under
direct ultrasound visualization, a 14 gauge TATTO device was
used to perform biopsy of the mass in the upper-outer left breast
using a lateral to medial approach. At the conclusion of the
procedure a ribbon shaped tissue marker clip was deployed into the
biopsy cavity. Follow up 2 view mammogram was performed and dictated
separately.
IMPRESSION: Ultrasound guided biopsy of the mass in the left breast at the [DATE]
position. No apparent complications.

## 2021-06-14 MED ORDER — LIDOCAINE HCL (PF) 2 % IJ SOLN
INTRAMUSCULAR | Status: AC
  Filled 2021-06-14: qty 10

## 2021-06-14 MED ORDER — LIDOCAINE-EPINEPHRINE (PF) 2 %-1:200000 IJ SOLN
INTRAMUSCULAR | Status: AC
  Administered 2021-06-14: 10 mL
  Filled 2021-06-14: qty 10

## 2021-06-14 NOTE — Progress Notes (Signed)
PT tolerated left breast biopsy well today with NAD noted. PT verbalized understanding of discharge instructions. PT ambulated back to the mammogram area this time.  

## 2021-06-15 LAB — SURGICAL PATHOLOGY

## 2021-06-16 ENCOUNTER — Telehealth: Payer: Self-pay | Admitting: Adult Health

## 2021-06-16 NOTE — Telephone Encounter (Signed)
Left message that pathology showed fibrocystic changes, no malignancy, take care

## 2021-06-21 ENCOUNTER — Telehealth: Payer: Self-pay | Admitting: Adult Health

## 2021-06-21 DIAGNOSIS — N6452 Nipple discharge: Secondary | ICD-10-CM

## 2021-06-21 NOTE — Telephone Encounter (Signed)
Radiologist recommends MRI breast due to clear persistent nipple discharge, scheduled at [facility] 06/28/21 at 12 N. Pt aware and says she will be there will get Daisy to check on PA.

## 2021-06-28 ENCOUNTER — Other Ambulatory Visit: Payer: Self-pay

## 2021-06-28 ENCOUNTER — Ambulatory Visit (HOSPITAL_COMMUNITY)
Admission: RE | Admit: 2021-06-28 | Discharge: 2021-06-28 | Disposition: A | Discharge status: Home / Self Care | Payer: Medicare Other | Source: Ambulatory Visit | Attending: Adult Health | Admitting: Adult Health

## 2021-06-28 DIAGNOSIS — N6452 Nipple discharge: Secondary | ICD-10-CM | POA: Insufficient documentation

## 2021-06-28 IMAGING — MR MR BREAST BILAT WO/W CM
8 of 11 series · 29 of 48 positions shown · IV contrast (7ML GADAVIST)
Comparison: Previous exam(s).

CLINICAL DATA: Patient with recent history of unilateral,
spontaneous, clear left nipple discharge. Diagnostic imaging on
[DATE] demonstrated a focal hypoechoic lesion in the left breast
at 2:30 o'clock, 4 cm the nipple. This underwent ultrasound-guided
core needle biopsy revealing fibrocystic changes with
calcifications, concordant with imaging, not explaining the
patient's nipple discharge. Current MRI is for further assessment.

LABS:  No labs drawn at time of imaging.
EXAM:
BILATERAL BREAST MRI WITH AND WITHOUT CONTRAST
TECHNIQUE: Multiplanar, multisequence MR images of both breasts were obtained
prior to and following the intravenous administration of 7 ml of
Gadavist

[Series 2: T2 · axial · 3.0mm · 0.86mm/px · 1 of 59 slices shown]
[im 1/59]
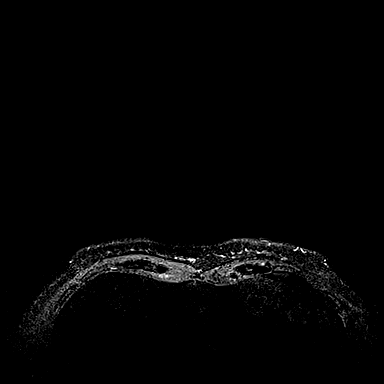

[Series 3: T1 fat-sat · axial · 1.2mm · 0.74mm/px · z∈[-83,+108]mm · 5 of 160 slices shown (1 of 4)]
[im 1/160]
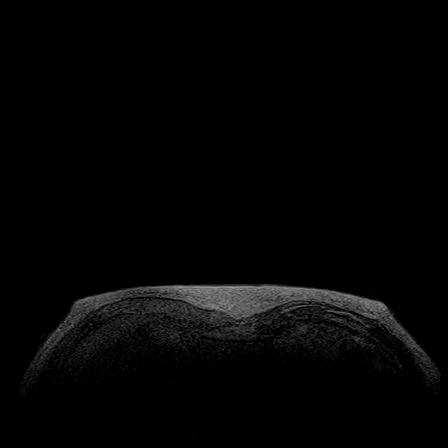
[im 40/160]
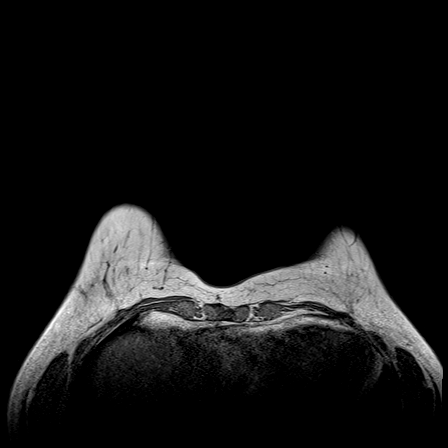
[im 80/160]
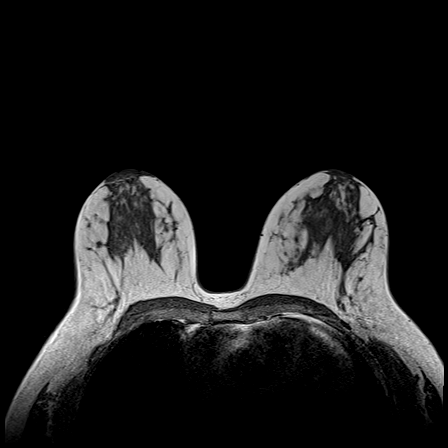
[im 120/160]
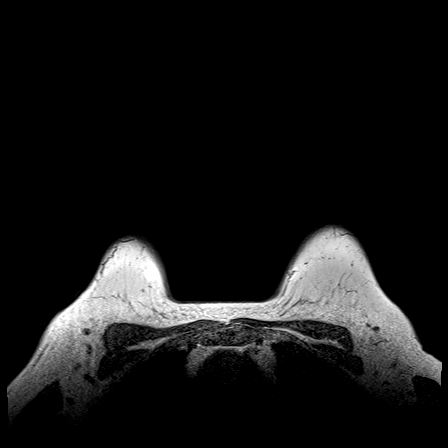
[im 160/160]
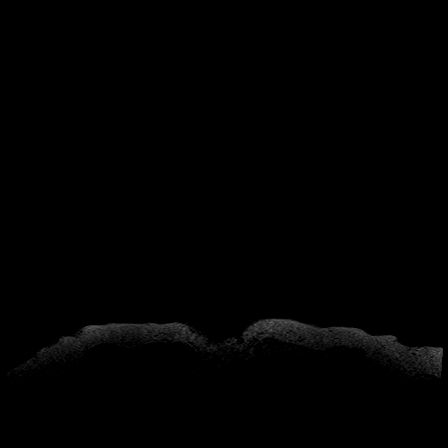

[Series 5: T1 fat-sat · axial · 1.2mm · 0.79mm/px · z∈[-74,+98]mm · 5 of 144 slices shown (2 of 4)]
[im 1/144]
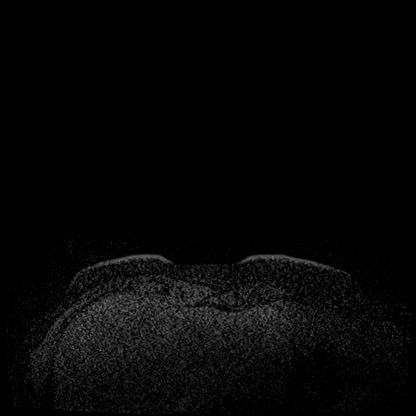
[im 36/144]
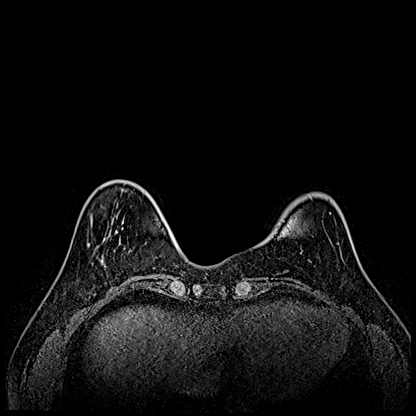
[im 72/144]
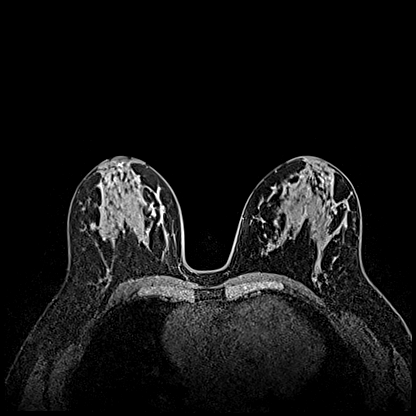
[im 108/144]
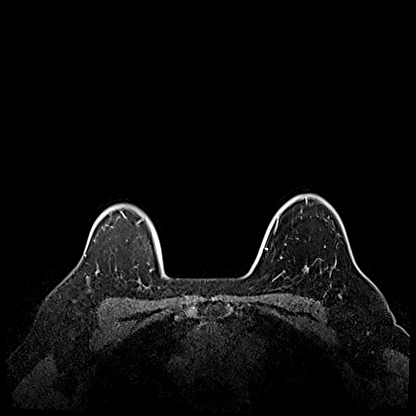
[im 144/144]
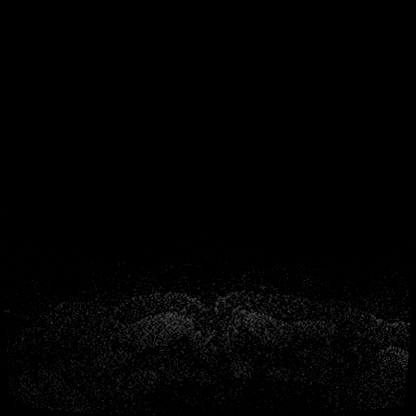

[Series 6: T1 fat-sat · axial · 1.2mm · 0.79mm/px · z∈[-74,+98]mm · 5 of 144 slices shown (3 of 4)]
[im 1/144]
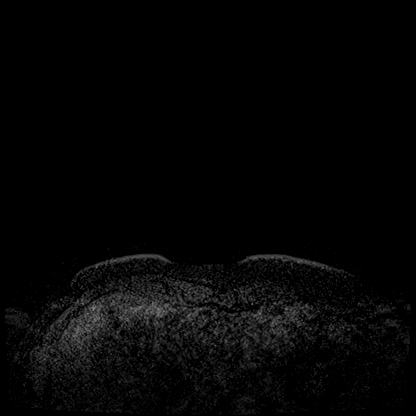
[im 36/144]
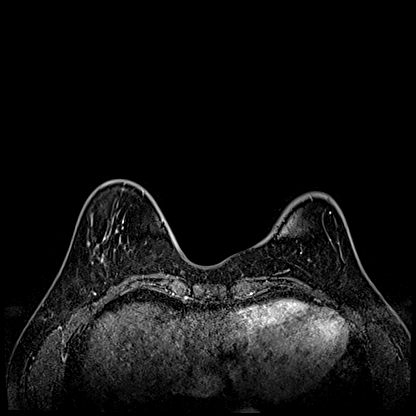
[im 72/144]
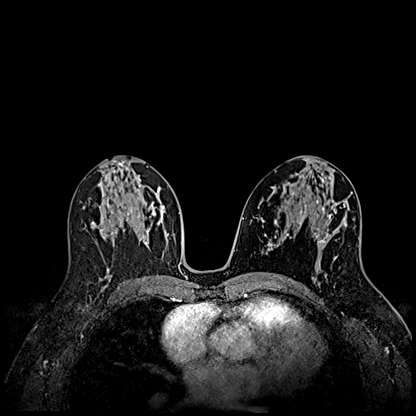
[im 108/144]
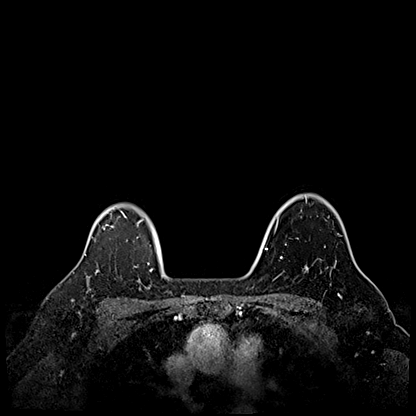
[im 144/144]
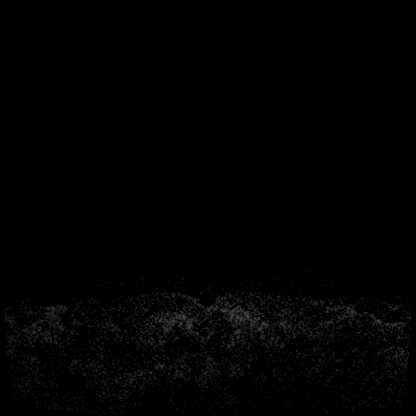

[Series 7: T1 · axial · 1.2mm · 0.79mm/px · z∈[-74,+98]mm · 6 of 144 slices shown (1 of 3)]
[im 1/144]
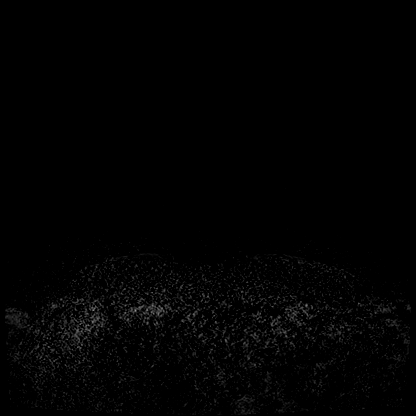
[im 29/144]
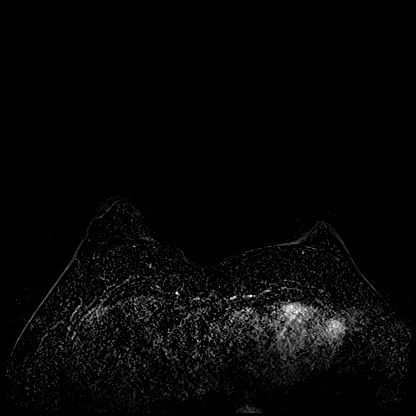
[im 58/144]
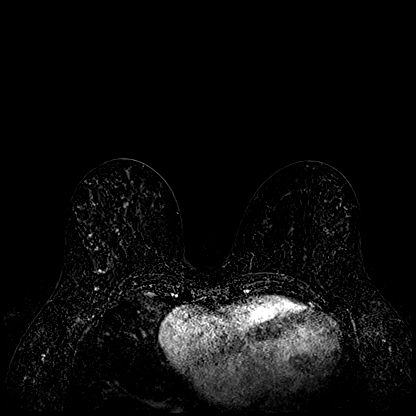
[im 86/144]
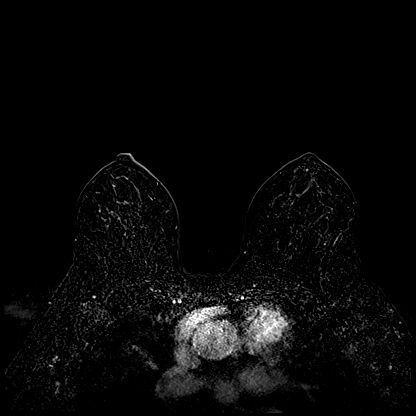
[im 115/144]
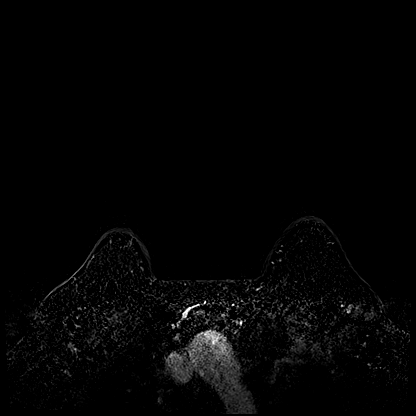
[im 144/144]
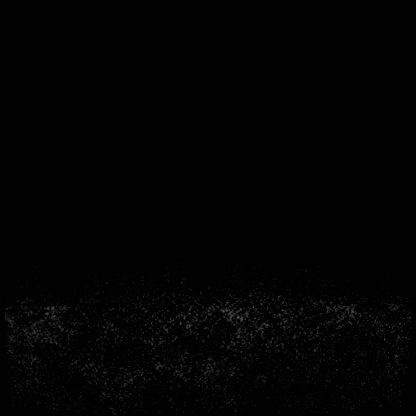

[Series 8: T1 · coronal · 330.0mm · 0.79mm/px · 1 of 3 slices shown (2 of 3)]
[im 1/3]
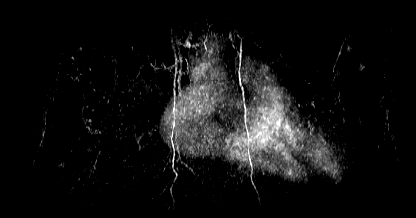

[Series 9: T1 · axial · 172.8mm · 0.79mm/px · 1 of 3 slices shown (3 of 3)]
[im 1/3]
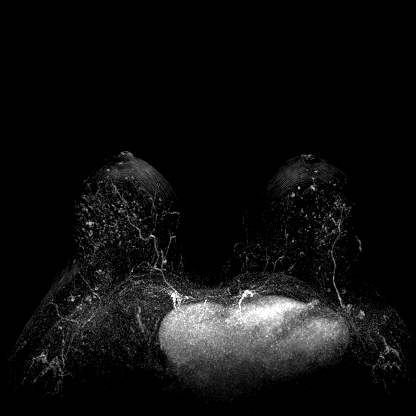

[Series 10: T1 fat-sat · axial · 1.2mm · 0.79mm/px · z∈[-74,+63]mm · 5 of 144 slices shown (4 of 4)]
[im 1/144]
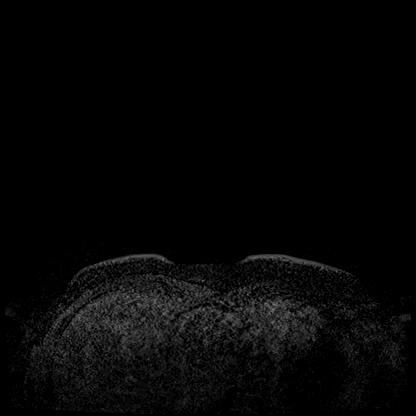
[im 29/144]
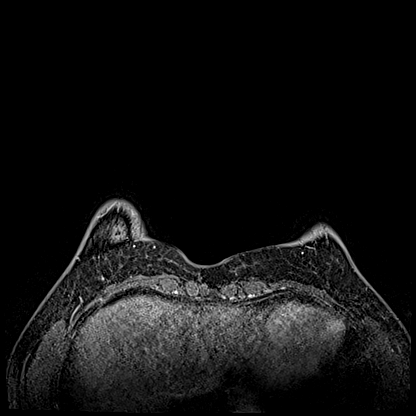
[im 58/144]
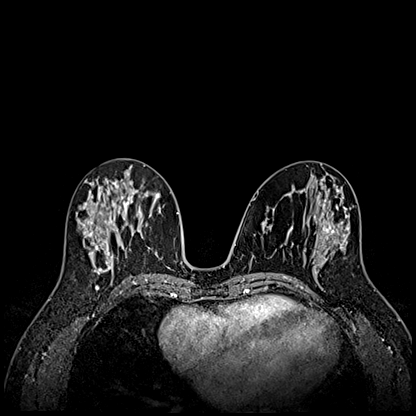
[im 86/144]
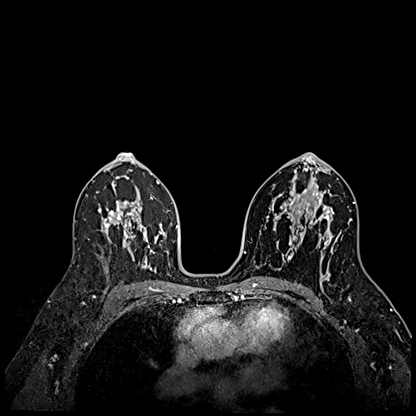
[im 115/144]
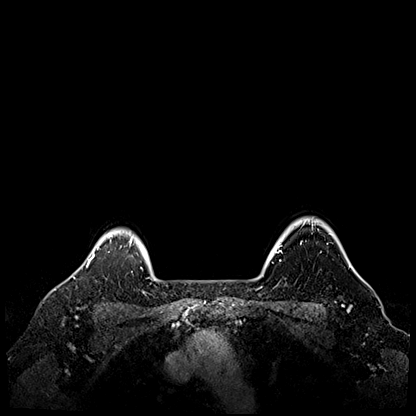

[29 of 48 positions shown; findings below may reference images not displayed]

Three-dimensional MR images were rendered by post-processing of the
original MR data on an independent workstation. The
three-dimensional MR images were interpreted, and findings are
reported in the following complete MRI report for this study. Three
dimensional images were evaluated at the independent interpreting
workstation using the DynaCAD thin client.
FINDINGS: Breast composition: c. Heterogeneous fibroglandular tissue.

Background parenchymal enhancement: Moderate.

Right breast: No mass or abnormal enhancement.

Left breast: No mass or abnormal enhancement. Susceptibility
artifact from the post biopsy marker clip lies in the lower outer
quadrant, middle depth.

Lymph nodes: No abnormal appearing lymph nodes.

Ancillary findings: 2 small T2 signal hyperintense lesions near the
dome of the left liver lobe consistent with a cysts.
IMPRESSION: 1. No evidence of breast malignancy.

RECOMMENDATION:
1. Annual screening mammography.
2. Clinical follow-up for the left nipple discharge if it
persists/recurs.

BI-RADS CATEGORY  1: Negative.

## 2021-06-28 MED ORDER — GADOBUTROL 1 MMOL/ML IV SOLN
7.0000 mL | Freq: Once | INTRAVENOUS | Status: AC | PRN
  Administered 2021-06-28: 7 mL via INTRAVENOUS

## 2021-07-04 DIAGNOSIS — J9801 Acute bronchospasm: Secondary | ICD-10-CM | POA: Diagnosis not present

## 2021-07-04 DIAGNOSIS — I10 Essential (primary) hypertension: Secondary | ICD-10-CM | POA: Diagnosis not present

## 2021-07-04 DIAGNOSIS — J209 Acute bronchitis, unspecified: Secondary | ICD-10-CM | POA: Diagnosis not present

## 2021-07-04 DIAGNOSIS — M1991 Primary osteoarthritis, unspecified site: Secondary | ICD-10-CM | POA: Diagnosis not present

## 2021-07-12 ENCOUNTER — Encounter: Payer: Self-pay | Admitting: Internal Medicine

## 2021-08-24 ENCOUNTER — Ambulatory Visit (INDEPENDENT_AMBULATORY_CARE_PROVIDER_SITE_OTHER): Payer: Self-pay | Admitting: *Deleted

## 2021-08-24 ENCOUNTER — Other Ambulatory Visit: Payer: Self-pay

## 2021-08-24 VITALS — Ht 63.0 in | Wt 176.0 lb

## 2021-08-24 DIAGNOSIS — Z86010 Personal history of colonic polyps: Secondary | ICD-10-CM

## 2021-08-24 NOTE — Progress Notes (Signed)
Gastroenterology Pre-Procedure Review  Request Date: 08/24/2021 Requesting Physician: Dr. Gerarda Fraction @ [facility], 5 year recall (overdue), Last TCS 06/26/2013 done by Dr. Gala Romney, colonic diverticulosis, tubular adenoma  PATIENT REVIEW QUESTIONS: The patient responded to the following health history questions as indicated:    1. Diabetes Melitis: no 2. Joint replacements in the past 12 months: no 3. Major health problems in the past 3 months: no 4. Has an artificial valve or MVP: no 5. Has a defibrillator: no 6. Has been advised in past to take antibiotics in advance of a procedure like teeth cleaning: no 7. Family history of colon cancer: no  8. Alcohol Use: yes, 1 beer daily 9. Illicit drug Use: no 10. History of sleep apnea: no  11. History of coronary artery or other vascular stents placed within the last 12 months: no, but heart stent placed in 2018 (Dr. Gwenlyn Found) 12. History of any prior anesthesia complications: no 13. Body mass index is 31.18 kg/m.    MEDICATIONS & ALLERGIES:    Patient reports the following regarding taking any blood thinners:   Plavix? no Aspirin? Yes, every other day Coumadin? no Brilinta? no Xarelto? no Eliquis? no Pradaxa? no Savaysa? no Effient? no  Patient confirms/reports the following medications:  Current Outpatient Medications  Medication Sig Dispense Refill   aspirin 81 MG chewable tablet Chew 1 tablet (81 mg total) by mouth daily. (Patient taking differently: Chew 81 mg by mouth every other day.) 30 tablet 0   b complex vitamins tablet Take 1 tablet by mouth daily.     Baclofen 5 MG TABS daily at 6 (six) AM.     carvedilol (COREG) 6.25 MG tablet Take 1 tablet (6.25 mg total) by mouth 2 (two) times daily. 180 tablet 1   Cholecalciferol (VITAMIN D-3) 125 MCG (5000 UT) TABS Take by mouth daily at 6 (six) AM.     diclofenac sodium (VOLTAREN) 1 % GEL Apply topically as needed.     diltiazem (TIAZAC) 180 MG 24 hr capsule daily at 6 (six) AM.      estradiol (ESTRACE) 2 MG tablet Take 1 tablet (2 mg total) by mouth daily. (Patient taking differently: Take 2 mg by mouth every other day.) 30 tablet 6   ferrous sulfate 325 (65 FE) MG EC tablet Take 325 mg by mouth daily with breakfast.     fluticasone (FLONASE) 50 MCG/ACT nasal spray as needed.     hydrALAZINE (APRESOLINE) 25 MG tablet Take 1 tablet (25 mg total) by mouth 3 (three) times daily. 270 tablet 3   hydrochlorothiazide (HYDRODIURIL) 25 MG tablet Take 25 mg by mouth daily.     hydrOXYzine (ATARAX/VISTARIL) 10 MG tablet Take 10 mg by mouth at bedtime. Takes one to two tablets as needed.     meloxicam (MOBIC) 7.5 MG tablet Take 7.5 mg by mouth as needed.     montelukast (SINGULAIR) 10 MG tablet Take 10 mg by mouth as needed.     Multiple Vitamin (MULTIVITAMIN WITH MINERALS) TABS tablet Take 1 tablet by mouth daily.     Multiple Vitamins-Minerals (HAIR/SKIN/NAILS PO) Take 1 tablet by mouth as needed.     nitroGLYCERIN (NITROSTAT) 0.4 MG SL tablet Place 1 tablet (0.4 mg total) under the tongue every 5 (five) minutes x 3 doses as needed. 25 tablet 3   omeprazole (PRILOSEC) 40 MG capsule Take 40 mg by mouth as needed.     potassium chloride (KLOR-CON) 20 MEQ packet Take 20 mEq by mouth daily.  rosuvastatin (CRESTOR) 40 MG tablet Take 1 tablet (40 mg total) by mouth daily. 90 tablet 3   traMADol (ULTRAM) 50 MG tablet Take 50 mg by mouth as needed for pain.     sertraline (ZOLOFT) 25 MG tablet Take 1 tablet (25 mg total) by mouth daily as needed (Hot Flashes). (Patient taking differently: Take 25 mg by mouth daily at 6 (six) AM.) 90 tablet 4   No current facility-administered medications for this visit.    Patient confirms/reports the following allergies:  Allergies  Allergen Reactions   Penicillins     Rash    Pollen Extract Other (See Comments)    Sinus symptoms, headache   Tape     Band aid-redness and brusing    No orders of the defined types were placed in this  encounter.   AUTHORIZATION INFORMATION Primary Insurance: [employer] Medicare,  ID #: [id number],  Group #: [id number] Pre-Cert / Josem Kaufmann required:  Pre-Cert / Auth #:   SCHEDULE INFORMATION: Procedure has been scheduled as follows:  Date: , Time:   Location: [facility] with Dr. Gala Romney  This Gastroenterology Pre-Precedure Review Form is being routed to the following provider(s): Aliene Altes, PA-C

## 2021-08-30 NOTE — Progress Notes (Signed)
Needs office visit due to medical history meeting ASA III criteria.

## 2021-09-08 NOTE — H&P (View-Only) (Signed)
Referring Provider: Redmond School, MD Primary Care Physician:  Redmond School, MD Primary Gastroenterologist:  Dr. Gala Romney  Chief Complaint  Patient presents with   hx polyps    Due for TCS    HPI:   Diane Parker is a 66 y.o. female with history of GERD and adenomatous colon polyps, presenting today to discuss scheduling surveillance colonoscopy.  Last colonoscopy was in October 2014 revealing colonic diverticulosis and a single 5 mm tubular adenoma.  Recommended repeat in 5 years, currently overdue.  Patient was initially triaged with the nurse for her colonoscopy; however, due to medical history meeting ASA 3 criteria, recommended office visit.  She also required increased sedation for her last procedure and would likely benefit from propofol.  She has history of coronary artery disease with prior DES to RCA in 2018.  Reviewed most recent visit with cardiology in April 2022.  Patient was doing well at that time.  She had previously reported some shoulder pain and had subsequent Lexiscan which was deemed to be low risk.  Symptoms likely secondary to arthritis as she had an injection in her neck with improvement in symptoms.  Today: Patient reports she was having rectal bleeding in October-November 2022. Bright red blood per rectum with every BM for about 1 month with blood in stool, toilet water, and on tissue. Was also having black stools intermittently.  She is chronically on iron (since 2017/2018 per patient), but this does not usually cause black stools for her.  During this time, she had associated nausea without vomiting and generalized abdominal pain, but worse in the lower abdomen.  Denies having any rectal pain at that time, but reports she does have some hemorrhoids.  Eventually, her symptoms improved. Denies being prescribed any medications for her symptoms. She has not had any return of rectal bleeding or black stools since November.  Her bowels are moving well without  constipation or diarrhea.  She does continue with mild intermittent abdominal cramping, primarily in the left lower abdomen.  It comes on randomly and does not last very long.  Maybe once a week.  Also with occasional mild nausea once every few days.    Also reports chronic reflux, currently uncontrolled with daily symptoms.  PCP started her on omeprazole 40 mg daily a couple of years ago which kept symptoms well controlled when she was taking it daily, but she stopped daily dosing and symptoms flared up about 1 month ago.  Now only taking the medication when symptoms are unbearable.  No dysphagia.Previously on Zantac and pantoprazole. 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 Eats a lot of fried/fatty foods, loves spicy foods, loves coffee but trying to limit this.   Taking meloxicam daily to twice daily for arthritis which is "all over".  Tylenol is helpful, but doesn't use this as often.     Past Medical History:  Diagnosis Date   Acute diverticulitis 03/28/2013   Seen on CT - treated with flagyl an cipro   Arthritis    "shoulders, knees, hips" (03/29/2017)   CAD (coronary artery disease), native coronary artery    03/29/17 PCI/DES to the mRCA, EF normal    Essential hypertension    GERD (gastroesophageal reflux disease)    Headache    "bad ones lately; maybe q other day" (03/29/2017)   High cholesterol     Past Surgical History:  Procedure Laterality Date   ABDOMINAL HYSTERECTOMY     COLONOSCOPY  11/08/2007   RUE:[id number], rectosigmoid junction/hyperplastic-appearing polypoid  mucosa, 1 of these areas biopsied.  Otherwise, normal rectum/Left-sided transverse diverticula/Diminutive polyp just distal to the ileocecal valve. Hyperplastic and adenomatous polyp.    COLONOSCOPY N/A 06/26/2013   [id number] diverticulosis. Colonic polyps removed   CORONARY ANGIOPLASTY WITH STENT PLACEMENT  03/29/2017   CORONARY STENT INTERVENTION N/A 03/29/2017   Procedure: Coronary Stent Intervention;  Surgeon:  Lorretta Harp, MD;  Location: [facility];  Service: Cardiovascular;  Laterality: N/A;   DILATION AND CURETTAGE OF UTERUS     ESOPHAGOGASTRODUODENOSCOPY  2008   RMR: normal   LEFT HEART CATH AND CORONARY ANGIOGRAPHY N/A 03/29/2017   Procedure: Left Heart Cath and Coronary Angiography;  Surgeon: Lorretta Harp, MD;  Location: [facility];  Service: Cardiovascular;  Laterality: N/A;   TUBAL LIGATION      Current Outpatient Medications  Medication Sig Dispense Refill   aspirin 81 MG chewable tablet Chew 1 tablet (81 mg total) by mouth daily. (Patient taking differently: Chew 81 mg by mouth every other day.) 30 tablet 0   b complex vitamins tablet Take 1 tablet by mouth daily.     Baclofen 5 MG TABS daily at 6 (six) AM.     carvedilol (COREG) 6.25 MG tablet Take 1 tablet (6.25 mg total) by mouth 2 (two) times daily. 180 tablet 1   Cholecalciferol (VITAMIN D-3) 125 MCG (5000 UT) TABS Take by mouth daily at 6 (six) AM.     diclofenac sodium (VOLTAREN) 1 % GEL Apply topically as needed.     diltiazem (TIAZAC) 180 MG 24 hr capsule daily at 6 (six) AM.     estradiol (ESTRACE) 2 MG tablet Take 1 tablet (2 mg total) by mouth daily. (Patient taking differently: Take 2 mg by mouth every other day.) 30 tablet 6   ferrous sulfate 325 (65 FE) MG EC tablet Take 325 mg by mouth daily with breakfast.     fluticasone (FLONASE) 50 MCG/ACT nasal spray as needed.     hydrALAZINE (APRESOLINE) 25 MG tablet Take 1 tablet (25 mg total) by mouth 3 (three) times daily. 270 tablet 3   hydrochlorothiazide (HYDRODIURIL) 25 MG tablet Take 25 mg by mouth daily.     hydrOXYzine (ATARAX/VISTARIL) 10 MG tablet Take 10 mg by mouth at bedtime. Takes one to two tablets as needed.     meloxicam (MOBIC) 7.5 MG tablet Take 7.5 mg by mouth as needed.     montelukast (SINGULAIR) 10 MG tablet Take 10 mg by mouth as needed.     Multiple Vitamin (MULTIVITAMIN WITH MINERALS) TABS tablet Take 1 tablet by mouth daily.      Multiple Vitamins-Minerals (HAIR/SKIN/NAILS PO) Take 1 tablet by mouth as needed.     nitroGLYCERIN (NITROSTAT) 0.4 MG SL tablet Place 1 tablet (0.4 mg total) under the tongue every 5 (five) minutes x 3 doses as needed. 25 tablet 3   omeprazole (PRILOSEC) 40 MG capsule Take 40 mg by mouth as needed.     potassium chloride (KLOR-CON) 20 MEQ packet Take 20 mEq by mouth daily.     rosuvastatin (CRESTOR) 40 MG tablet Take 1 tablet (40 mg total) by mouth daily. 90 tablet 3   sertraline (ZOLOFT) 25 MG tablet Take 1 tablet (25 mg total) by mouth daily as needed (Hot Flashes). (Patient taking differently: Take 25 mg by mouth daily at 6 (six) AM.) 90 tablet 4   traMADol (ULTRAM) 50 MG tablet Take 50 mg by mouth as needed for pain.  No current facility-administered medications for this visit.    Allergies as of 09/09/2021 - Review Complete 09/09/2021  Allergen Reaction Noted   Penicillins  05/30/2019   Pollen extract Other (See Comments) 04/22/2021   Tape  04/22/2021    Family History  Problem Relation Age of Onset   Heart disease Father    Aneurysm Mother    Cancer Cousin        breast   Other Daughter        PTSD   Colon cancer Neg Hx     Social History   Socioeconomic History   Marital status: Single    Spouse name: Not on file   Number of children: Not on file   Years of education: Not on file   Highest education level: Not on file  Occupational History   Not on file  Tobacco Use   Smoking status: Former    Packs/day: 0.12    Years: 3.00    Pack years: 0.36    Types: Cigarettes    Quit date: 1990    Years since quitting: 33.0   Smokeless tobacco: Never  Vaping Use   Vaping Use: Never used  Substance and Sexual Activity   Alcohol use: Not Currently    Comment: 1-2 beer every other day on average, occasional mixed drink on the weekend.   Drug use: Yes    Types: Marijuana    Comment: 09/09/21- "1 joint will last 6 months". 03/29/2017 "smoked a little weed when I was  young"   Sexual activity: Yes    Birth control/protection: Surgical    Comment: tubal/hyst  Other Topics Concern   Not on file  Social History Narrative   Not on file   Social Determinants of Health   Financial Resource Strain: Low Risk    Difficulty of Paying Living Expenses: Not very hard  Food Insecurity: No Food Insecurity   Worried About Charity fundraiser in the Last Year: Never true   Ran Out of Food in the Last Year: Never true  Transportation Needs: No Transportation Needs   Lack of Transportation (Medical): No   Lack of Transportation (Non-Medical): No  Physical Activity: Sufficiently Active   Days of Exercise per Week: 5 days   Minutes of Exercise per Session: 60 min  Stress: Stress Concern Present   Feeling of Stress : To some extent  Social Connections: Moderately Integrated   Frequency of Communication with Friends and Family: More than three times a week   Frequency of Social Gatherings with Friends and Family: Three times a week   Attends Religious Services: 1 to 4 times per year   Active Member of Clubs or Organizations: No   Attends Music therapist: 1 to 4 times per year   Marital Status: Never married  Human resources officer Violence: Not At Risk   Fear of Current or Ex-Partner: No   Emotionally Abused: No   Physically Abused: No   Sexually Abused: No    Review of Systems: Gen: Denies any fever, chills, cold or flulike symptoms, presyncope, syncope. CV: Denies chest pain, heart palpitations. Resp: Denies shortness of breath or cough. GI: See HPI GU : Denies urinary burning, urinary frequency, urinary hesitancy MS: Admits to diffuse arthritis pain. Derm: Denies rash Psych: Denies depression, anxiety Heme: See HPI  Physical Exam: BP 130/77    Pulse 79    Temp (!) 97.1 F (36.2 C)    Ht _0  (1.6 m)  Wt 176 lb 3.2 oz (79.9 kg)    BMI 31.21 kg/m  General:   Alert and oriented. Pleasant and cooperative. Well-nourished and well-developed.   Head:  Normocephalic and atraumatic. Eyes:  Without icterus, sclera clear and conjunctiva pink.  Ears:  Normal auditory acuity. Lungs:  Clear to auscultation bilaterally. No wheezes, rales, or rhonchi. No distress.  Heart:  S1, S2 present without murmurs appreciated.  Abdomen:  +BS, soft, and non-distended. Mild TTP in LLQ. No guarding or rebound. No masses appreciated. No HSM noted.  Rectal:  Deferred  Msk:  Symmetrical without gross deformities. Normal posture. Extremities:  Without edema. Neurologic:  Alert and  oriented x4;  grossly normal neurologically. Skin:  Intact without significant lesions or rashes. Psych:  Normal mood and affect.    Labs (included in referral):   05/04/2021: CBC: WBC 5.8, hemoglobin 11.0 (L), hematocrit 32.9 (L), MCV 89, MCH 29.7, MCHC 33.4, platelets 245. CMP: Glucose 100, BUN 14, creatinine 0.88, sodium 137, potassium 3.8, chloride 100, calcium 9.6, total protein 6.6, albumin 4.4, total bilirubin 0.3, alk phos 63, AST 29, ALT 25 TSH: 0.878 Cholesterol panel: Cholesterol 190, triglycerides 123, HDL 72, LDL 97 Vitamin D, 25: 73.7  05/14/21: Vitamin B12 768 Folate 15 Ferritin 410 (H), TIBC 304, iron 89, saturation 29%.    Assessment:  66 y.o. female with history of GERD and adenomatous colon polyps, presenting today to discuss scheduling surveillance colonoscopy and also reporting rectal bleeding and black stools in October-November 2022 with associated abdominal pain and nausea, and uncontrolled GERD.  Reviewed labs included in referral revealing mild anemia, hemoglobin 11.0 with normocytic indices.  History of adenomatous colon polyps: Overdue for surveillance.  Last colonoscopy in October 2014 with colonic diverticulosis and a single 5 mm tubular adenoma with recommendations to repeat in 5 years.  Plan for colonoscopy in the near future.  Rectal bleeding/melena/abdominal pain:  New onset bright red blood per rectum with every bowel movement along  with intermittent melena in October-November, then spontaneously resolving.  This was associated with generalized abdominal pain, worse in the lower abdomen and nausea without vomiting which have now improved.  Only with very mild, short-lived, intermittent LLQ abdominal cramping about once a week without identified trigger and occasional nausea without vomiting.  Denies change in bowel habits or unintentional weight loss. She does use NSAIDs daily, has history of diverticulosis on colonoscopy in 2014, and also currently with uncontrolled GERD which is likely influencing her ongoing nausea. No recent abdominal imaging. Last labs in September with mild normocytic anemia with hemoglobin 11, LFTs wnl. Exam with mild TTP in LLQ without rebound or guarding.   Etiology of her acute symptoms in [place] are unclear. Differential includes acute gastroenteritis, diverticulitis, colitis, diverticular bleed, benign anorectal bleeding as patient reports hx of hemorrhoids, gastritis, duodenitis, PUD in the setting of NSAIDs. May also have generalized insult to her GI tract in the setting of chronic NSAIDs. No chronic symptoms to suggest IBD.   No need for abdominal imaging at this time. We will update a CBC and arrange EGD and colonoscopy in the near future.    GERD: Chronic.  Uncontrolled on PPI as needed, only taking when symptoms are unbearable.  Dietary indiscretion likely playing a role here.  Denies dysphagia.  Recommended resuming omeprazole 40 mg every day 30 minutes before breakfast and reinforced GERD diet/lifestyle.  Mild normocytic anemia: Hemoglobin 11.0 in August 2022.  Per chart review, appears she has had mild intermittent anemia with hemoglobin in the  upper 10/11 range since 2014.  Colonoscopy in 2014 with a 5 mm tubular adenoma, overdue for surveillance.  No prior EGD.  Ferritin on file in 2019 elevated.  Iron panel in September 2022 more consistent with anemia of chronic disease; however,  patient reports she has been on oral iron intermittently since 2017/2018.  Vitamin B12 and folate also within normal limits.  Creatinine within normal limits.  Reports new onset rectal bleeding and intermittent black stools in October-November 2022 as per above, now resolved.  Also with uncontrolled GERD and chronic NSAID use which does put her at risk for gastritis, duodenitis, PUD, and inflammation/erosions/ulcerations throughout the small bowel and/or colon.  Recommend EGD and colonoscopy to further evaluate her episodes of rectal bleeding and melena.  We will also update labs.   Plan:  CBC, iron panel with ferritin EGD and colonoscopy with propofol with Dr. Gala Romney in the near future. The risks, benefits, and alternatives have been discussed with the patient in detail. The patient states understanding and desires to proceed. ASA 3 Hold iron x7 days prior to procedure. Resume omeprazole 40 mg every day 30 minutes before breakfast. Counseled on GERD diet/lifestyle.  Separate written instructions provided. Limit meloxicam is much as possible and avoid all other NSAIDs aside from 81 mg aspirin.  Encouraged to use Tylenol first for her arthritis pain, no more than 3000 mg of Tylenol per day. Advised patient to monitor for return of rectal bleeding, black stools, or worsening abdominal pain and let me know if this occurs. Follow-up after procedures.   Aliene Altes, PA-C [facility] 09/09/2021

## 2021-09-08 NOTE — Progress Notes (Signed)
Spoke to pt.  Scheduled ov for 09/09/2021 at 10:00 with Aliene Altes, PA-C.

## 2021-09-08 NOTE — Progress Notes (Signed)
Referring Provider: Redmond School, MD Primary Care Physician:  Redmond School, MD Primary Gastroenterologist:  Dr. Gala Romney  Chief Complaint  Patient presents with   hx polyps    Due for TCS    HPI:   Diane Parker is a 66 y.o. female with history of GERD and adenomatous colon polyps, presenting today to discuss scheduling surveillance colonoscopy.  Last colonoscopy was in October 2014 revealing colonic diverticulosis and a single 5 mm tubular adenoma.  Recommended repeat in 5 years, currently overdue.  Patient was initially triaged with the nurse for her colonoscopy; however, due to medical history meeting ASA 3 criteria, recommended office visit.  She also required increased sedation for her last procedure and would likely benefit from propofol.  She has history of coronary artery disease with prior DES to RCA in 2018.  Reviewed most recent visit with cardiology in April 2022.  Patient was doing well at that time.  She had previously reported some shoulder pain and had subsequent Lexiscan which was deemed to be low risk.  Symptoms likely secondary to arthritis as she had an injection in her neck with improvement in symptoms.  Today: Patient reports she was having rectal bleeding in October-November 2022. Bright red blood per rectum with every BM for about 1 month with blood in stool, toilet water, and on tissue. Was also having black stools intermittently.  She is chronically on iron (since 2017/2018 per patient), but this does not usually cause black stools for her.  During this time, she had associated nausea without vomiting and generalized abdominal pain, but worse in the lower abdomen.  Denies having any rectal pain at that time, but reports she does have some hemorrhoids.  Eventually, her symptoms improved. Denies being prescribed any medications for her symptoms. She has not had any return of rectal bleeding or black stools since November.  Her bowels are moving well without  constipation or diarrhea.  She does continue with mild intermittent abdominal cramping, primarily in the left lower abdomen.  It comes on randomly and does not last very long.  Maybe once a week.  Also with occasional mild nausea once every few days.    Also reports chronic reflux, currently uncontrolled with daily symptoms.  PCP started her on omeprazole 40 mg daily a couple of years ago which kept symptoms well controlled when she was taking it daily, but she stopped daily dosing and symptoms flared up about 1 month ago.  Now only taking the medication when symptoms are unbearable.  No dysphagia.Previously on Zantac and pantoprazole. 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 Eats a lot of fried/fatty foods, loves spicy foods, loves coffee but trying to limit this.   Taking meloxicam daily to twice daily for arthritis which is "all over".  Tylenol is helpful, but doesn't use this as often.     Past Medical History:  Diagnosis Date   Acute diverticulitis 03/28/2013   Seen on CT - treated with flagyl an cipro   Arthritis    "shoulders, knees, hips" (03/29/2017)   CAD (coronary artery disease), native coronary artery    03/29/17 PCI/DES to the mRCA, EF normal    Essential hypertension    GERD (gastroesophageal reflux disease)    Headache    "bad ones lately; maybe q other day" (03/29/2017)   High cholesterol     Past Surgical History:  Procedure Laterality Date   ABDOMINAL HYSTERECTOMY     COLONOSCOPY  11/08/2007   RUE:[id number], rectosigmoid junction/hyperplastic-appearing polypoid  mucosa, 1 of these areas biopsied.  Otherwise, normal rectum/Left-sided transverse diverticula/Diminutive polyp just distal to the ileocecal valve. Hyperplastic and adenomatous polyp.    COLONOSCOPY N/A 06/26/2013   [id number] diverticulosis. Colonic polyps removed   CORONARY ANGIOPLASTY WITH STENT PLACEMENT  03/29/2017   CORONARY STENT INTERVENTION N/A 03/29/2017   Procedure: Coronary Stent Intervention;  Surgeon:  Lorretta Harp, MD;  Location: [facility];  Service: Cardiovascular;  Laterality: N/A;   DILATION AND CURETTAGE OF UTERUS     ESOPHAGOGASTRODUODENOSCOPY  2008   RMR: normal   LEFT HEART CATH AND CORONARY ANGIOGRAPHY N/A 03/29/2017   Procedure: Left Heart Cath and Coronary Angiography;  Surgeon: Lorretta Harp, MD;  Location: [facility];  Service: Cardiovascular;  Laterality: N/A;   TUBAL LIGATION      Current Outpatient Medications  Medication Sig Dispense Refill   aspirin 81 MG chewable tablet Chew 1 tablet (81 mg total) by mouth daily. (Patient taking differently: Chew 81 mg by mouth every other day.) 30 tablet 0   b complex vitamins tablet Take 1 tablet by mouth daily.     Baclofen 5 MG TABS daily at 6 (six) AM.     carvedilol (COREG) 6.25 MG tablet Take 1 tablet (6.25 mg total) by mouth 2 (two) times daily. 180 tablet 1   Cholecalciferol (VITAMIN D-3) 125 MCG (5000 UT) TABS Take by mouth daily at 6 (six) AM.     diclofenac sodium (VOLTAREN) 1 % GEL Apply topically as needed.     diltiazem (TIAZAC) 180 MG 24 hr capsule daily at 6 (six) AM.     estradiol (ESTRACE) 2 MG tablet Take 1 tablet (2 mg total) by mouth daily. (Patient taking differently: Take 2 mg by mouth every other day.) 30 tablet 6   ferrous sulfate 325 (65 FE) MG EC tablet Take 325 mg by mouth daily with breakfast.     fluticasone (FLONASE) 50 MCG/ACT nasal spray as needed.     hydrALAZINE (APRESOLINE) 25 MG tablet Take 1 tablet (25 mg total) by mouth 3 (three) times daily. 270 tablet 3   hydrochlorothiazide (HYDRODIURIL) 25 MG tablet Take 25 mg by mouth daily.     hydrOXYzine (ATARAX/VISTARIL) 10 MG tablet Take 10 mg by mouth at bedtime. Takes one to two tablets as needed.     meloxicam (MOBIC) 7.5 MG tablet Take 7.5 mg by mouth as needed.     montelukast (SINGULAIR) 10 MG tablet Take 10 mg by mouth as needed.     Multiple Vitamin (MULTIVITAMIN WITH MINERALS) TABS tablet Take 1 tablet by mouth daily.      Multiple Vitamins-Minerals (HAIR/SKIN/NAILS PO) Take 1 tablet by mouth as needed.     nitroGLYCERIN (NITROSTAT) 0.4 MG SL tablet Place 1 tablet (0.4 mg total) under the tongue every 5 (five) minutes x 3 doses as needed. 25 tablet 3   omeprazole (PRILOSEC) 40 MG capsule Take 40 mg by mouth as needed.     potassium chloride (KLOR-CON) 20 MEQ packet Take 20 mEq by mouth daily.     rosuvastatin (CRESTOR) 40 MG tablet Take 1 tablet (40 mg total) by mouth daily. 90 tablet 3   sertraline (ZOLOFT) 25 MG tablet Take 1 tablet (25 mg total) by mouth daily as needed (Hot Flashes). (Patient taking differently: Take 25 mg by mouth daily at 6 (six) AM.) 90 tablet 4   traMADol (ULTRAM) 50 MG tablet Take 50 mg by mouth as needed for pain.  No current facility-administered medications for this visit.    Allergies as of 09/09/2021 - Review Complete 09/09/2021  Allergen Reaction Noted   Penicillins  05/30/2019   Pollen extract Other (See Comments) 04/22/2021   Tape  04/22/2021    Family History  Problem Relation Age of Onset   Heart disease Father    Aneurysm Mother    Cancer Cousin        breast   Other Daughter        PTSD   Colon cancer Neg Hx     Social History   Socioeconomic History   Marital status: Single    Spouse name: Not on file   Number of children: Not on file   Years of education: Not on file   Highest education level: Not on file  Occupational History   Not on file  Tobacco Use   Smoking status: Former    Packs/day: 0.12    Years: 3.00    Pack years: 0.36    Types: Cigarettes    Quit date: 1990    Years since quitting: 33.0   Smokeless tobacco: Never  Vaping Use   Vaping Use: Never used  Substance and Sexual Activity   Alcohol use: Not Currently    Comment: 1-2 beer every other day on average, occasional mixed drink on the weekend.   Drug use: Yes    Types: Marijuana    Comment: 09/09/21- "1 joint will last 6 months". 03/29/2017 "smoked a little weed when I was  young"   Sexual activity: Yes    Birth control/protection: Surgical    Comment: tubal/hyst  Other Topics Concern   Not on file  Social History Narrative   Not on file   Social Determinants of Health   Financial Resource Strain: Low Risk    Difficulty of Paying Living Expenses: Not very hard  Food Insecurity: No Food Insecurity   Worried About Charity fundraiser in the Last Year: Never true   Ran Out of Food in the Last Year: Never true  Transportation Needs: No Transportation Needs   Lack of Transportation (Medical): No   Lack of Transportation (Non-Medical): No  Physical Activity: Sufficiently Active   Days of Exercise per Week: 5 days   Minutes of Exercise per Session: 60 min  Stress: Stress Concern Present   Feeling of Stress : To some extent  Social Connections: Moderately Integrated   Frequency of Communication with Friends and Family: More than three times a week   Frequency of Social Gatherings with Friends and Family: Three times a week   Attends Religious Services: 1 to 4 times per year   Active Member of Clubs or Organizations: No   Attends Music therapist: 1 to 4 times per year   Marital Status: Never married  Human resources officer Violence: Not At Risk   Fear of Current or Ex-Partner: No   Emotionally Abused: No   Physically Abused: No   Sexually Abused: No    Review of Systems: Gen: Denies any fever, chills, cold or flulike symptoms, presyncope, syncope. CV: Denies chest pain, heart palpitations. Resp: Denies shortness of breath or cough. GI: See HPI GU : Denies urinary burning, urinary frequency, urinary hesitancy MS: Admits to diffuse arthritis pain. Derm: Denies rash Psych: Denies depression, anxiety Heme: See HPI  Physical Exam: BP 130/77    Pulse 79    Temp (!) 97.1 F (36.2 C)    Ht _0  (1.6 m)  Wt 176 lb 3.2 oz (79.9 kg)    BMI 31.21 kg/m  General:   Alert and oriented. Pleasant and cooperative. Well-nourished and well-developed.   Head:  Normocephalic and atraumatic. Eyes:  Without icterus, sclera clear and conjunctiva pink.  Ears:  Normal auditory acuity. Lungs:  Clear to auscultation bilaterally. No wheezes, rales, or rhonchi. No distress.  Heart:  S1, S2 present without murmurs appreciated.  Abdomen:  +BS, soft, and non-distended. Mild TTP in LLQ. No guarding or rebound. No masses appreciated. No HSM noted.  Rectal:  Deferred  Msk:  Symmetrical without gross deformities. Normal posture. Extremities:  Without edema. Neurologic:  Alert and  oriented x4;  grossly normal neurologically. Skin:  Intact without significant lesions or rashes. Psych:  Normal mood and affect.    Labs (included in referral):   05/04/2021: CBC: WBC 5.8, hemoglobin 11.0 (L), hematocrit 32.9 (L), MCV 89, MCH 29.7, MCHC 33.4, platelets 245. CMP: Glucose 100, BUN 14, creatinine 0.88, sodium 137, potassium 3.8, chloride 100, calcium 9.6, total protein 6.6, albumin 4.4, total bilirubin 0.3, alk phos 63, AST 29, ALT 25 TSH: 0.878 Cholesterol panel: Cholesterol 190, triglycerides 123, HDL 72, LDL 97 Vitamin D, 25: 73.7  05/14/21: Vitamin B12 768 Folate 15 Ferritin 410 (H), TIBC 304, iron 89, saturation 29%.    Assessment:  67 y.o. female with history of GERD and adenomatous colon polyps, presenting today to discuss scheduling surveillance colonoscopy and also reporting rectal bleeding and black stools in October-November 2022 with associated abdominal pain and nausea, and uncontrolled GERD.  Reviewed labs included in referral revealing mild anemia, hemoglobin 11.0 with normocytic indices.  History of adenomatous colon polyps: Overdue for surveillance.  Last colonoscopy in October 2014 with colonic diverticulosis and a single 5 mm tubular adenoma with recommendations to repeat in 5 years.  Plan for colonoscopy in the near future.  Rectal bleeding/melena/abdominal pain:  New onset bright red blood per rectum with every bowel movement along  with intermittent melena in October-November, then spontaneously resolving.  This was associated with generalized abdominal pain, worse in the lower abdomen and nausea without vomiting which have now improved.  Only with very mild, short-lived, intermittent LLQ abdominal cramping about once a week without identified trigger and occasional nausea without vomiting.  Denies change in bowel habits or unintentional weight loss. She does use NSAIDs daily, has history of diverticulosis on colonoscopy in 2014, and also currently with uncontrolled GERD which is likely influencing her ongoing nausea. No recent abdominal imaging. Last labs in September with mild normocytic anemia with hemoglobin 11, LFTs wnl. Exam with mild TTP in LLQ without rebound or guarding.   Etiology of her acute symptoms in [place] are unclear. Differential includes acute gastroenteritis, diverticulitis, colitis, diverticular bleed, benign anorectal bleeding as patient reports hx of hemorrhoids, gastritis, duodenitis, PUD in the setting of NSAIDs. May also have generalized insult to her GI tract in the setting of chronic NSAIDs. No chronic symptoms to suggest IBD.   No need for abdominal imaging at this time. We will update a CBC and arrange EGD and colonoscopy in the near future.    GERD: Chronic.  Uncontrolled on PPI as needed, only taking when symptoms are unbearable.  Dietary indiscretion likely playing a role here.  Denies dysphagia.  Recommended resuming omeprazole 40 mg every day 30 minutes before breakfast and reinforced GERD diet/lifestyle.  Mild normocytic anemia: Hemoglobin 11.0 in August 2022.  Per chart review, appears she has had mild intermittent anemia with hemoglobin in the  upper 10/11 range since 2014.  Colonoscopy in 2014 with a 5 mm tubular adenoma, overdue for surveillance.  No prior EGD.  Ferritin on file in 2019 elevated.  Iron panel in September 2022 more consistent with anemia of chronic disease; however,  patient reports she has been on oral iron intermittently since 2017/2018.  Vitamin B12 and folate also within normal limits.  Creatinine within normal limits.  Reports new onset rectal bleeding and intermittent black stools in October-November 2022 as per above, now resolved.  Also with uncontrolled GERD and chronic NSAID use which does put her at risk for gastritis, duodenitis, PUD, and inflammation/erosions/ulcerations throughout the small bowel and/or colon.  Recommend EGD and colonoscopy to further evaluate her episodes of rectal bleeding and melena.  We will also update labs.   Plan:  CBC, iron panel with ferritin EGD and colonoscopy with propofol with Dr. Gala Romney in the near future. The risks, benefits, and alternatives have been discussed with the patient in detail. The patient states understanding and desires to proceed. ASA 3 Hold iron x7 days prior to procedure. Resume omeprazole 40 mg every day 30 minutes before breakfast. Counseled on GERD diet/lifestyle.  Separate written instructions provided. Limit meloxicam is much as possible and avoid all other NSAIDs aside from 81 mg aspirin.  Encouraged to use Tylenol first for her arthritis pain, no more than 3000 mg of Tylenol per day. Advised patient to monitor for return of rectal bleeding, black stools, or worsening abdominal pain and let me know if this occurs. Follow-up after procedures.   Diane Altes, PA-C [facility] 09/09/2021

## 2021-09-09 ENCOUNTER — Ambulatory Visit (INDEPENDENT_AMBULATORY_CARE_PROVIDER_SITE_OTHER): Payer: No Typology Code available for payment source | Admitting: Gastroenterology

## 2021-09-09 ENCOUNTER — Other Ambulatory Visit: Payer: Self-pay

## 2021-09-09 ENCOUNTER — Other Ambulatory Visit: Payer: Self-pay | Admitting: Gastroenterology

## 2021-09-09 ENCOUNTER — Encounter: Payer: Self-pay | Admitting: Gastroenterology

## 2021-09-09 VITALS — BP 130/77 | HR 79 | Temp 97.1°F | Ht 63.0 in | Wt 176.2 lb

## 2021-09-09 DIAGNOSIS — K625 Hemorrhage of anus and rectum: Secondary | ICD-10-CM | POA: Diagnosis not present

## 2021-09-09 DIAGNOSIS — R109 Unspecified abdominal pain: Secondary | ICD-10-CM

## 2021-09-09 DIAGNOSIS — K219 Gastro-esophageal reflux disease without esophagitis: Secondary | ICD-10-CM

## 2021-09-09 DIAGNOSIS — K921 Melena: Secondary | ICD-10-CM

## 2021-09-09 DIAGNOSIS — D649 Anemia, unspecified: Secondary | ICD-10-CM | POA: Diagnosis not present

## 2021-09-09 NOTE — Patient Instructions (Signed)
Please have blood work completed at [facility]   We will arrange for you to have a colonoscopy and upper endoscopy in the near future with Dr. Gala Romney. Hold iron for 7 days prior to your procedures.  Resume taking omeprazole 40 mg every day 30 minutes before breakfast.  Limit meloxicam is much as possible and avoid all other NSAIDs including ibuprofen, Aleve, Advil, BC powders, Goody powders, and anything that says "NSAID" on the package.  I recommend you take Tylenol first for your arthritis pain.  No more than 3000 mg/day.  Follow a GERD diet:  Avoid fried, fatty, greasy, spicy, citrus foods. Avoid caffeine and carbonated beverages. Avoid chocolate. Try eating 4-6 small meals a day rather than 3 large meals. Do not eat within 3 hours of laying down. Prop head of bed up on wood or bricks to create a 6 inch incline.  I am glad you are feeling much better! Monitor for return of rectal bleeding, black stools, or worsening abdominal pain and let me know if this occurs.   We will plan to follow-up with you in the office after your procedures.  Do not hesitate to call if you have any questions or concerns prior to your next visit.  Happy New Year!   Aliene Altes, PA-C [facility]

## 2021-09-10 LAB — CBC/DIFF AMBIGUOUS DEFAULT
Basophils Absolute: 0 10*3/uL (ref 0.0–0.2)
Basos: 0 %
EOS (ABSOLUTE): 0.1 10*3/uL (ref 0.0–0.4)
Eos: 2 %
Hematocrit: 33 % — ABNORMAL LOW (ref 34.0–46.6)
Hemoglobin: 11.2 g/dL (ref 11.1–15.9)
Immature Grans (Abs): 0 10*3/uL (ref 0.0–0.1)
Immature Granulocytes: 0 %
Lymphocytes Absolute: 1.7 10*3/uL (ref 0.7–3.1)
Lymphs: 32 %
MCH: 30.3 pg (ref 26.6–33.0)
MCHC: 33.9 g/dL (ref 31.5–35.7)
MCV: 89 fL (ref 79–97)
Monocytes Absolute: 0.5 10*3/uL (ref 0.1–0.9)
Monocytes: 9 %
Neutrophils Absolute: 3.2 10*3/uL (ref 1.4–7.0)
Neutrophils: 57 %
Platelets: 239 10*3/uL (ref 150–450)
RBC: 3.7 x10E6/uL — ABNORMAL LOW (ref 3.77–5.28)
RDW: 12.5 % (ref 11.7–15.4)
WBC: 5.5 10*3/uL (ref 3.4–10.8)

## 2021-09-10 LAB — IRON AND TIBC
Iron Saturation: 30 % (ref 15–55)
Iron: 99 ug/dL (ref 27–139)
Total Iron Binding Capacity: 330 ug/dL (ref 250–450)
UIBC: 231 ug/dL (ref 118–369)

## 2021-09-10 LAB — FERRITIN: Ferritin: 479 ng/mL — ABNORMAL HIGH (ref 15–150)

## 2021-09-12 ENCOUNTER — Other Ambulatory Visit: Payer: Self-pay | Admitting: *Deleted

## 2021-09-12 DIAGNOSIS — D649 Anemia, unspecified: Secondary | ICD-10-CM

## 2021-09-13 ENCOUNTER — Other Ambulatory Visit: Payer: Self-pay | Admitting: *Deleted

## 2021-09-13 DIAGNOSIS — D649 Anemia, unspecified: Secondary | ICD-10-CM

## 2021-09-15 ENCOUNTER — Telehealth: Payer: Self-pay | Admitting: *Deleted

## 2021-09-15 ENCOUNTER — Encounter: Payer: Self-pay | Admitting: *Deleted

## 2021-09-15 MED ORDER — PEG 3350-KCL-NA BICARB-NACL 420 G PO SOLR: ORAL | 0 refills | Status: DC

## 2021-09-15 NOTE — Telephone Encounter (Signed)
Called pt and she is aware of her pre-op appt details

## 2021-09-15 NOTE — Telephone Encounter (Signed)
Pt returned call. Scheduled for 1/19 at 9:30am. She will come by office and pick instructions up. Aware will call back with pre-op appt. Confirmed pharmacy. Also aware to stop iron today. She stated she has not been taking her iron.   Checked availity and no PA is required for services.

## 2021-09-15 NOTE — Telephone Encounter (Signed)
LMOVM to call back to schedule TCS/EGD with propofol asa 3, Dr. Gala Romney

## 2021-09-16 NOTE — Patient Instructions (Signed)
Diane Parker  09/16/2021     @PREFPERIOPPHARMACY @   Your procedure is scheduled on 09/22/2021.   Report to [facility] at  0800 A.M.   Call this number if you have problems the morning of surgery:  [phone]   Remember:  Follow the diet and prep instructions given to you by the office.    Take these medicines the morning of surgery with A SIP OF WATER             carvedilol, zyrtec, diltiazem, prilosec, zoloft, tramadol.     Do not wear jewelry, make-up or nail polish.  Do not wear lotions, powders, or perfumes, or deodorant.  Do not shave 48 hours prior to surgery.  Men may shave face and neck.  Do not bring valuables to the hospital.  [facility] is not responsible for any belongings or valuables.  Contacts, dentures or bridgework may not be worn into surgery.  Leave your suitcase in the car.  After surgery it may be brought to your room.  For patients admitted to the hospital, discharge time will be determined by your treatment team.  Patients discharged the day of surgery will not be allowed to drive home and must have someone with them for 24 hours.    Special instructions:   DO NOT smoke tobacco or vape for 24 hours before your procedure.  Please read over the following fact sheets that you were given. Anesthesia Post-op Instructions and Care and Recovery After Surgery      Upper Endoscopy, Adult, Care After This sheet gives you information about how to care for yourself after your procedure. Your health care provider may also give you more specific instructions. If you have problems or questions, contact your health care provider. What can I expect after the procedure? After the procedure, it is common to have: A sore throat. Mild stomach pain or discomfort. Bloating. Nausea. Follow these instructions at home:  Follow instructions from your health care provider about what to eat or drink after your procedure. Return to your normal  activities as told by your health care provider. Ask your health care provider what activities are safe for you. Take over-the-counter and prescription medicines only as told by your health care provider. If you were given a sedative during the procedure, it can affect you for several hours. Do not drive or operate machinery until your health care provider says that it is safe. Keep all follow-up visits as told by your health care provider. This is important. Contact a health care provider if you have: A sore throat that lasts longer than one day. Trouble swallowing. Get help right away if: You vomit blood or your vomit looks like coffee grounds. You have: A fever. Bloody, black, or tarry stools. A severe sore throat or you cannot swallow. Difficulty breathing. Severe pain in your chest or abdomen. Summary After the procedure, it is common to have a sore throat, mild stomach discomfort, bloating, and nausea. If you were given a sedative during the procedure, it can affect you for several hours. Do not drive or operate machinery until your health care provider says that it is safe. Follow instructions from your health care provider about what to eat or drink after your procedure. Return to your normal activities as told by your health care provider. This information is not intended to replace advice given to you by your health care provider. Make sure you discuss any questions  you have with your health care provider. Document Revised: 06/27/2019 Document Reviewed: 01/21/2018 Elsevier Patient Education  2022 [place]. Colonoscopy, Adult, Care After This sheet gives you information about how to care for yourself after your procedure. Your health care provider may also give you more specific instructions. If you have problems or questions, contact your health care provider. What can I expect after the procedure? After the procedure, it is common to have: A small amount of blood in your  stool for 24 hours after the procedure. Some gas. Mild cramping or bloating of your abdomen. Follow these instructions at home: Eating and drinking  Drink enough fluid to keep your urine pale yellow. Follow instructions from your health care provider about eating or drinking restrictions. Resume your normal diet as instructed by your health care provider. Avoid heavy or fried foods that are hard to digest. Activity Rest as told by your health care provider. Avoid sitting for a long time without moving. Get up to take short walks every 1-2 hours. This is important to improve blood flow and breathing. Ask for help if you feel weak or unsteady. Return to your normal activities as told by your health care provider. Ask your health care provider what activities are safe for you. Managing cramping and bloating  Try walking around when you have cramps or feel bloated. Apply heat to your abdomen as told by your health care provider. Use the heat source that your health care provider recommends, such as a moist heat pack or a heating pad. Place a towel between your skin and the heat source. Leave the heat on for 20-30 minutes. Remove the heat if your skin turns bright red. This is especially important if you are unable to feel pain, heat, or cold. You may have a greater risk of getting burned. General instructions If you were given a sedative during the procedure, it can affect you for several hours. Do not drive or operate machinery until your health care provider says that it is safe. For the first 24 hours after the procedure: Do not sign important documents. Do not drink alcohol. Do your regular daily activities at a slower pace than normal. Eat soft foods that are easy to digest. Take over-the-counter and prescription medicines only as told by your health care provider. Keep all follow-up visits as told by your health care provider. This is important. Contact a health care provider if: You  have blood in your stool 2-3 days after the procedure. Get help right away if you have: More than a small spotting of blood in your stool. Large blood clots in your stool. Swelling of your abdomen. Nausea or vomiting. A fever. Increasing pain in your abdomen that is not relieved with medicine. Summary After the procedure, it is common to have a small amount of blood in your stool. You may also have mild cramping and bloating of your abdomen. If you were given a sedative during the procedure, it can affect you for several hours. Do not drive or operate machinery until your health care provider says that it is safe. Get help right away if you have a lot of blood in your stool, nausea or vomiting, a fever, or increased pain in your abdomen. This information is not intended to replace advice given to you by your health care provider. Make sure you discuss any questions you have with your health care provider. Document Revised: 06/27/2019 Document Reviewed: 03/17/2019 Elsevier Patient Education  [place] Anesthesia  Care, Care After This sheet gives you information about how to care for yourself after your procedure. Your health care provider may also give you more specific instructions. If you have problems or questions, contact your health care provider. What can I expect after the procedure? After the procedure, it is common to have: Tiredness. Forgetfulness about what happened after the procedure. Impaired judgment for important decisions. Nausea or vomiting. Some difficulty with balance. Follow these instructions at home: For the time period you were told by your health care provider:   Rest as needed. Do not participate in activities where you could fall or become injured. Do not drive or use machinery. Do not drink alcohol. Do not take sleeping pills or medicines that cause drowsiness. Do not make important decisions or sign legal documents. Do not take care of  children on your own. Eating and drinking Follow the diet that is recommended by your health care provider. Drink enough fluid to keep your urine pale yellow. If you vomit: Drink water, juice, or soup when you can drink without vomiting. Make sure you have little or no nausea before eating solid foods. General instructions Have a responsible adult stay with you for the time you are told. It is important to have someone help care for you until you are awake and alert. Take over-the-counter and prescription medicines only as told by your health care provider. If you have sleep apnea, surgery and certain medicines can increase your risk for breathing problems. Follow instructions from your health care provider about wearing your sleep device: Anytime you are sleeping, including during daytime naps. While taking prescription pain medicines, sleeping medicines, or medicines that make you drowsy. Avoid smoking. Keep all follow-up visits as told by your health care provider. This is important. Contact a health care provider if: You keep feeling nauseous or you keep vomiting. You feel light-headed. You are still sleepy or having trouble with balance after 24 hours. You develop a rash. You have a fever. You have redness or swelling around the IV site. Get help right away if: You have trouble breathing. You have new-onset confusion at home. Summary For several hours after your procedure, you may feel tired. You may also be forgetful and have poor judgment. Have a responsible adult stay with you for the time you are told. It is important to have someone help care for you until you are awake and alert. Rest as told. Do not drive or operate machinery. Do not drink alcohol or take sleeping pills. Get help right away if you have trouble breathing, or if you suddenly become confused. This information is not intended to replace advice given to you by your health care provider. Make sure you discuss any  questions you have with your health care provider. Document Revised: 05/06/2020 Document Reviewed: 07/24/2019 Elsevier Patient Education  2022 [employer].

## 2021-09-20 ENCOUNTER — Encounter (HOSPITAL_COMMUNITY)
Admission: RE | Admit: 2021-09-20 | Discharge: 2021-09-20 | Disposition: A | Discharge status: Home / Self Care | Payer: No Typology Code available for payment source | Source: Ambulatory Visit | Attending: Internal Medicine | Admitting: Internal Medicine

## 2021-09-20 ENCOUNTER — Other Ambulatory Visit: Payer: Self-pay

## 2021-09-20 ENCOUNTER — Encounter (HOSPITAL_COMMUNITY): Payer: Self-pay

## 2021-09-20 VITALS — BP 151/71 | HR 65 | Temp 97.1°F | Ht 63.0 in | Wt 174.0 lb

## 2021-09-20 DIAGNOSIS — Z79899 Other long term (current) drug therapy: Secondary | ICD-10-CM | POA: Insufficient documentation

## 2021-09-20 DIAGNOSIS — Z01818 Encounter for other preprocedural examination: Secondary | ICD-10-CM | POA: Diagnosis not present

## 2021-09-20 LAB — BASIC METABOLIC PANEL
Anion gap: 11 (ref 5–15)
BUN: 19 mg/dL (ref 8–23)
CO2: 23 mmol/L (ref 22–32)
Calcium: 9.2 mg/dL (ref 8.9–10.3)
Chloride: 102 mmol/L (ref 98–111)
Creatinine, Ser: 0.9 mg/dL (ref 0.44–1.00)
GFR, Estimated: >60 mL/min (ref >60)
Glucose, Bld: 101 mg/dL — ABNORMAL HIGH (ref 70–99)
Potassium: 3.8 mmol/L (ref 3.5–5.1)
Sodium: 136 mmol/L (ref 135–145)

## 2021-09-22 ENCOUNTER — Ambulatory Visit (HOSPITAL_COMMUNITY)
Admission: RE | Admit: 2021-09-22 | Discharge: 2021-09-22 | Disposition: A | Discharge status: Home / Self Care | Payer: No Typology Code available for payment source | Attending: Internal Medicine | Admitting: Internal Medicine

## 2021-09-22 ENCOUNTER — Ambulatory Visit (HOSPITAL_COMMUNITY): Payer: No Typology Code available for payment source | Admitting: Anesthesiology

## 2021-09-22 ENCOUNTER — Other Ambulatory Visit: Payer: Self-pay

## 2021-09-22 ENCOUNTER — Encounter (HOSPITAL_COMMUNITY)
Admission: RE | Disposition: A | Discharge status: Home / Self Care | Payer: Self-pay | Source: Home / Self Care | Attending: Internal Medicine

## 2021-09-22 ENCOUNTER — Encounter (HOSPITAL_COMMUNITY): Payer: Self-pay | Admitting: Internal Medicine

## 2021-09-22 DIAGNOSIS — D649 Anemia, unspecified: Secondary | ICD-10-CM | POA: Insufficient documentation

## 2021-09-22 DIAGNOSIS — Z79899 Other long term (current) drug therapy: Secondary | ICD-10-CM | POA: Insufficient documentation

## 2021-09-22 DIAGNOSIS — K635 Polyp of colon: Secondary | ICD-10-CM | POA: Diagnosis not present

## 2021-09-22 DIAGNOSIS — Z955 Presence of coronary angioplasty implant and graft: Secondary | ICD-10-CM | POA: Insufficient documentation

## 2021-09-22 DIAGNOSIS — Z87891 Personal history of nicotine dependence: Secondary | ICD-10-CM | POA: Diagnosis not present

## 2021-09-22 DIAGNOSIS — K5730 Diverticulosis of large intestine without perforation or abscess without bleeding: Secondary | ICD-10-CM | POA: Diagnosis not present

## 2021-09-22 DIAGNOSIS — D122 Benign neoplasm of ascending colon: Secondary | ICD-10-CM | POA: Diagnosis not present

## 2021-09-22 DIAGNOSIS — M3119 Other thrombotic microangiopathy: Secondary | ICD-10-CM | POA: Diagnosis not present

## 2021-09-22 DIAGNOSIS — Z7982 Long term (current) use of aspirin: Secondary | ICD-10-CM | POA: Insufficient documentation

## 2021-09-22 DIAGNOSIS — K921 Melena: Secondary | ICD-10-CM | POA: Insufficient documentation

## 2021-09-22 DIAGNOSIS — Z791 Long term (current) use of non-steroidal anti-inflammatories (NSAID): Secondary | ICD-10-CM | POA: Diagnosis not present

## 2021-09-22 DIAGNOSIS — I10 Essential (primary) hypertension: Secondary | ICD-10-CM | POA: Diagnosis not present

## 2021-09-22 DIAGNOSIS — I2510 Atherosclerotic heart disease of native coronary artery without angina pectoris: Secondary | ICD-10-CM | POA: Insufficient documentation

## 2021-09-22 DIAGNOSIS — K219 Gastro-esophageal reflux disease without esophagitis: Secondary | ICD-10-CM | POA: Insufficient documentation

## 2021-09-22 DIAGNOSIS — M1990 Unspecified osteoarthritis, unspecified site: Secondary | ICD-10-CM | POA: Diagnosis not present

## 2021-09-22 DIAGNOSIS — K449 Diaphragmatic hernia without obstruction or gangrene: Secondary | ICD-10-CM | POA: Diagnosis not present

## 2021-09-22 DIAGNOSIS — D120 Benign neoplasm of cecum: Secondary | ICD-10-CM | POA: Insufficient documentation

## 2021-09-22 HISTORY — PX: COLONOSCOPY WITH PROPOFOL: SHX5780

## 2021-09-22 HISTORY — PX: ESOPHAGOGASTRODUODENOSCOPY (EGD) WITH PROPOFOL: SHX5813

## 2021-09-22 HISTORY — PX: POLYPECTOMY: SHX5525

## 2021-09-22 SURGERY — COLONOSCOPY WITH PROPOFOL
Anesthesia: General

## 2021-09-22 MED ORDER — LIDOCAINE HCL (CARDIAC) PF 50 MG/5ML IV SOSY
PREFILLED_SYRINGE | INTRAVENOUS | Status: DC | PRN
  Administered 2021-09-22: 50 mg via INTRAVENOUS

## 2021-09-22 MED ORDER — STERILE WATER FOR IRRIGATION IR SOLN
Status: DC | PRN
  Administered 2021-09-22 (×2): .6 mL

## 2021-09-22 MED ORDER — PROPOFOL 500 MG/50ML IV EMUL
INTRAVENOUS | Status: DC | PRN
  Administered 2021-09-22: 100 ug/kg/min via INTRAVENOUS

## 2021-09-22 MED ORDER — LACTATED RINGERS IV SOLN: INTRAVENOUS | Status: DC

## 2021-09-22 MED ORDER — PROPOFOL 10 MG/ML IV BOLUS
INTRAVENOUS | Status: DC | PRN
  Administered 2021-09-22: 40 mg via INTRAVENOUS
  Administered 2021-09-22 (×2): 20 mg via INTRAVENOUS
  Administered 2021-09-22: 40 mg via INTRAVENOUS
  Administered 2021-09-22: 150 mg via INTRAVENOUS
  Administered 2021-09-22: 100 mg via INTRAVENOUS

## 2021-09-22 NOTE — Op Note (Signed)
[facility] Patient Name: Diane Parker Procedure Date: 09/22/2021 10:57 AM MRN: [id number] Date of Birth: 03-02-1956 Attending MD: Norvel Richards , MD CSN: [id number] Age: 66 Admit Type: Outpatient Procedure:                Upper GI endoscopy Indications:              Suspected esophageal reflux Providers:                Norvel Richards, MD, Gwynneth Albright RN,                            RN, Aram Candela Referring MD:              Medicines:                Propofol per Anesthesia Complications:            No immediate complications. Estimated Blood Loss:     Estimated blood loss: none. Procedure:                Pre-Anesthesia Assessment:                           - Prior to the procedure, a History and Physical                            was performed, and patient medications and                            allergies were reviewed. The patient's tolerance of                            previous anesthesia was also reviewed. The risks                            and benefits of the procedure and the sedation                            options and risks were discussed with the patient.                            All questions were answered, and informed consent                            was obtained. Prior Anticoagulants: The patient has                            taken no previous anticoagulant or antiplatelet                            agents. ASA Grade Assessment: III - A patient with                            severe systemic disease. After reviewing the risks  and benefits, the patient was deemed in                            satisfactory condition to undergo the procedure.                           After obtaining informed consent, the endoscope was                            passed under direct vision. Throughout the                            procedure, the patient's blood pressure, pulse, and                            oxygen  saturations were monitored continuously. The                            [id number]-[id number] ([id number]) scope was introduced through the                            mouth, and advanced to the second part of duodenum.                            The upper GI endoscopy was accomplished without                            difficulty. The patient tolerated the procedure                            well. Scope In: 11:23:44 AM Scope Out: 11:27:39 AM Total Procedure Duration: 0 hours 3 minutes 55 seconds  Findings:      The examined esophagus was normal.      A small hiatal hernia was present.      The duodenal bulb and second portion of the duodenum were normal. Impression:               - Normal esophagus.                           - Small hiatal hernia.                           - Normal duodenal bulb and second portion of the                            duodenum.                           - No specimens collected. Moderate Sedation:      Moderate (conscious) sedation was personally administered by an       anesthesia professional. The following parameters were monitored: oxygen       saturation, heart rate, blood pressure, respiratory rate, EKG, adequacy       of pulmonary ventilation, and response to care. Recommendation:           -  Patient has a contact number available for                            emergencies. The signs and symptoms of potential                            delayed complications were discussed with the                            patient. Return to normal activities tomorrow.                            Written discharge instructions were provided to the                            patient.                           - Resume previous diet.                           - Continue present medications. Continue omeprazole                            40 mg daily. See colonoscopy report.                           - Return to my office in 2 months. Procedure Code(s):        --- Professional ---                            [phone], Esophagogastroduodenoscopy, flexible,                            transoral; diagnostic, including collection of                            specimen(s) by brushing or washing, when performed                            (separate procedure) Diagnosis Code(s):        --- Professional ---                           K44.9, Diaphragmatic hernia without obstruction or                            gangrene CPT copyright 2019 American Medical Association. All rights reserved. The codes documented in this report are preliminary and upon coder review may  be revised to meet current compliance requirements. Cristopher Estimable. Jadasia Haws, MD Norvel Richards, MD 09/22/2021 12:03:21 PM This report has been signed electronically. Number of Addenda: 0

## 2021-09-22 NOTE — Anesthesia Postprocedure Evaluation (Signed)
Anesthesia Post Note  Patient: Diane Parker  Procedure(s) Performed: COLONOSCOPY WITH PROPOFOL ESOPHAGOGASTRODUODENOSCOPY (EGD) WITH PROPOFOL POLYPECTOMY  Patient location during evaluation: Endoscopy Anesthesia Type: General Level of consciousness: awake and alert Pain management: pain level controlled Vital Signs Assessment: post-procedure vital signs reviewed and stable Respiratory status: spontaneous breathing, nonlabored ventilation, respiratory function stable and patient connected to nasal cannula oxygen Cardiovascular status: blood pressure returned to baseline and stable Postop Assessment: no apparent nausea or vomiting Anesthetic complications: no   No notable events documented.   Last Vitals:  Vitals:   09/22/21 0814 09/22/21 1201  BP:  133/61  Pulse: 68 69  Resp: 17 16  Temp: 36.8 C 36.5 C  SpO2: 97% 100%    Last Pain:  Vitals:   09/22/21 1201  TempSrc: Oral  PainSc: 0-No pain                 Trixie Rude

## 2021-09-22 NOTE — Op Note (Signed)
[facility] Patient Name: Diane Parker Procedure Date: 09/22/2021 11:29 AM MRN: [id number] Date of Birth: 05/08/1956 Attending MD: Norvel Richards , MD CSN: [id number] Age: 66 Admit Type: Outpatient Procedure:                Colonoscopy Indications:              Hematochezia Providers:                Norvel Richards, MD, Gwynneth Albright RN,                            RN, Aram Candela Referring MD:              Medicines:                Propofol per Anesthesia Complications:            No immediate complications. Estimated Blood Loss:     Estimated blood loss was minimal. Procedure:                Pre-Anesthesia Assessment:                           - Prior to the procedure, a History and Physical                            was performed, and patient medications and                            allergies were reviewed. The patient's tolerance of                            previous anesthesia was also reviewed. The risks                            and benefits of the procedure and the sedation                            options and risks were discussed with the patient.                            All questions were answered, and informed consent                            was obtained. Prior Anticoagulants: The patient has                            taken no previous anticoagulant or antiplatelet                            agents. ASA Grade Assessment: III - A patient with                            severe systemic disease. After reviewing the risks  and benefits, the patient was deemed in                            satisfactory condition to undergo the procedure.                           After obtaining informed consent, the colonoscope                            was passed under direct vision. Throughout the                            procedure, the patient's blood pressure, pulse, and                            oxygen saturations were  monitored continuously. The                            [id number]) scope was introduced through                            the anus and advanced to the the cecum, identified                            by appendiceal orifice and ileocecal valve. The                            colonoscopy was performed without difficulty. The                            patient tolerated the procedure well. The quality                            of the bowel preparation was adequate. Scope In: 11:32:00 AM Scope Out: 11:54:14 AM Scope Withdrawal Time: 0 hours 14 minutes 20 seconds  Total Procedure Duration: 0 hours 22 minutes 14 seconds  Findings:      The perianal and digital rectal examinations were normal.      Scattered small and large-mouthed diverticula were found in the sigmoid       colon and descending colon.      Two sessile polyps were found in the ascending colon and cecum. The       polyps were 3 to 4 mm in size. These polyps were removed with a cold       snare. Resection and retrieval were complete. Estimated blood loss was       minimal.      A 20 x 20 mm polyp was found in the ascending colon. It was sessile. It       was removed cleanly with 2 passes of a hot snare cautery. Impression:               - Diverticulosis in the sigmoid colon and in the                            descending colon.                           -  Two 3 to 4 mm polyps in the ascending colon and                            in the cecum, removed with a cold snare. Resected                            and retrieved.                           - One 20 mm polyp in the ascending colon -removed                            with hot snare.. Moderate Sedation:      Moderate (conscious) sedation was personally administered by an       anesthesia professional. The following parameters were monitored: oxygen       saturation, heart rate, blood pressure, respiratory rate, EKG, adequacy       of pulmonary ventilation, and  response to care. Recommendation:           - Patient has a contact number available for                            emergencies. The signs and symptoms of potential                            delayed complications were discussed with the                            patient. Return to normal activities tomorrow.                            Written discharge instructions were provided to the                            patient.                           - Advance diet as tolerated. Follow-up on                            pathology. See EGD report.                           - Repeat colonoscopy date to be determined after                            pending pathology results are reviewed for                            surveillance.                           - Return to GI office in 2 months. Procedure Code(s):        --- Professional ---  45385, Colonoscopy, flexible; with removal of                            tumor(s), polyp(s), or other lesion(s) by snare                            technique Diagnosis Code(s):        --- Professional ---                           K63.5, Polyp of colon                           K92.1, Melena (includes Hematochezia)                           K57.30, Diverticulosis of large intestine without                            perforation or abscess without bleeding CPT copyright 2019 American Medical Association. All rights reserved. The codes documented in this report are preliminary and upon coder review may  be revised to meet current compliance requirements. Cristopher Estimable. Oleg Oleson, MD Norvel Richards, MD 09/22/2021 12:09:14 PM This report has been signed electronically. Number of Addenda: 0

## 2021-09-22 NOTE — Anesthesia Procedure Notes (Signed)
Date/Time: 09/22/2021 11:16 AM Performed by: Vista Deck, CRNA Pre-anesthesia Checklist: Patient identified, Emergency Drugs available, Suction available, Timeout performed and Patient being monitored Patient Re-evaluated:Patient Re-evaluated prior to induction Oxygen Delivery Method: Nasal Cannula

## 2021-09-22 NOTE — Transfer of Care (Signed)
Immediate Anesthesia Transfer of Care Note  Patient: Diane Parker  Procedure(s) Performed: COLONOSCOPY WITH PROPOFOL ESOPHAGOGASTRODUODENOSCOPY (EGD) WITH PROPOFOL POLYPECTOMY  Patient Location: Short Stay  Anesthesia Type:General  Level of Consciousness: awake and patient cooperative  Airway & Oxygen Therapy: Patient Spontanous Breathing  Post-op Assessment: Report given to RN and Post -op Vital signs reviewed and stable  Post vital signs: Reviewed and stable  Last Vitals:  Vitals Value Taken Time  BP    Temp    Pulse    Resp    SpO2      Last Pain:  Vitals:   09/22/21 1122  TempSrc:   PainSc: 0-No pain      Patients Stated Pain Goal: 7 (01/65/53 7482)  Complications: No notable events documented.

## 2021-09-22 NOTE — Anesthesia Preprocedure Evaluation (Signed)
Anesthesia Evaluation  Patient identified by MRN, date of birth, ID band Patient awake    Reviewed: Allergy & Precautions, NPO status , Patient's Chart, lab work & pertinent test results  Airway Mallampati: II  TM Distance: >3 FB     Dental no notable dental hx.    Pulmonary neg pulmonary ROS, former smoker,    Pulmonary exam normal        Cardiovascular Exercise Tolerance: Good hypertension, + CAD (2018 PCI, no angina now)  Normal cardiovascular exam  ECHO EF 60%   Neuro/Psych negative neurological ROS     GI/Hepatic Neg liver ROS, GERD  ,  Endo/Other  negative endocrine ROS  Renal/GU negative Renal ROS     Musculoskeletal  (+) Arthritis ,   Abdominal   Peds  Hematology negative hematology ROS (+) anemia ,   Anesthesia Other Findings   Reproductive/Obstetrics                             Anesthesia Physical Anesthesia Plan  ASA: 3  Anesthesia Plan: General   Post-op Pain Management:    Induction: Intravenous  PONV Risk Score and Plan: 2  Airway Management Planned: Nasal Cannula and Natural Airway  Additional Equipment:   Intra-op Plan:   Post-operative Plan:   Informed Consent: I have reviewed the patients History and Physical, chart, labs and discussed the procedure including the risks, benefits and alternatives for the proposed anesthesia with the patient or authorized representative who has indicated his/her understanding and acceptance.     Dental advisory given  Plan Discussed with:   Anesthesia Plan Comments:         Anesthesia Quick Evaluation

## 2021-09-22 NOTE — Interval H&P Note (Signed)
History and Physical Interval Note:  09/22/2021 11:09 AM  Diane Parker  has presented today for surgery, with the diagnosis of rb, ANEMIA, BLACK STOOL, GERD, ABD PAIN.  The various methods of treatment have been discussed with the patient and family. After consideration of risks, benefits and other options for treatment, the patient has consented to  Procedure(s) with comments: COLONOSCOPY WITH PROPOFOL (N/A) - 9:30am ESOPHAGOGASTRODUODENOSCOPY (EGD) WITH PROPOFOL (N/A) as a surgical intervention.  The patient's history has been reviewed, patient examined, no change in status, stable for surgery.  I have reviewed the patient's chart and labs.  Questions were answered to the patient's satisfaction.     Diane Parker  Patient here for diagnostic colonoscopy.  Intermittent rectal bleeding, history of colonic adenoma.  Also EGD for melena and GERD.  Denies dysphagia.  Patient notes taking omeprazole 40 mg every days with associated marked improvement in her reflux symptoms.  I have offered the patient both a diagnostic EGD and colonoscopy today per plan. The risks, benefits, limitations, imponderables and alternatives regarding both EGD and colonoscopy have been reviewed with the patient. Questions have been answered. All parties agreeable.

## 2021-09-22 NOTE — Discharge Instructions (Signed)
Colonoscopy Discharge Instructions  Read the instructions outlined below and refer to this sheet in the next few weeks. These discharge instructions provide you with general information on caring for yourself after you leave the hospital. Your doctor may also give you specific instructions. While your treatment has been planned according to the most current medical practices available, unavoidable complications occasionally occur. If you have any problems or questions after discharge, call Dr. Gala Romney at [phone]. ACTIVITY You may resume your regular activity, but move at a slower pace for the next 24 hours.  Take frequent rest periods for the next 24 hours.  Walking will help get rid of the air and reduce the bloated feeling in your belly (abdomen).  No driving for 24 hours (because of the medicine (anesthesia) used during the test).   Do not sign any important legal documents or operate any machinery for 24 hours (because of the anesthesia used during the test).  NUTRITION Drink plenty of fluids.  You may resume your normal diet as instructed by your doctor.  Begin with a light meal and progress to your normal diet. Heavy or fried foods are harder to digest and may make you feel sick to your stomach (nauseated).  Avoid alcoholic beverages for 24 hours or as instructed.  MEDICATIONS You may resume your normal medications unless your doctor tells you otherwise.  WHAT YOU CAN EXPECT TODAY Some feelings of bloating in the abdomen.  Passage of more gas than usual.  Spotting of blood in your stool or on the toilet paper.  IF YOU HAD POLYPS REMOVED DURING THE COLONOSCOPY: No aspirin products for 7 days or as instructed.  No alcohol for 7 days or as instructed.  Eat a soft diet for the next 24 hours.  FINDING OUT THE RESULTS OF YOUR TEST Not all test results are available during your visit. If your test results are not back during the visit, make an appointment with your caregiver to find out the  results. Do not assume everything is normal if you have not heard from your caregiver or the medical facility. It is important for you to follow up on all of your test results.  SEEK IMMEDIATE MEDICAL ATTENTION IF: You have more than a spotting of blood in your stool.  Your belly is swollen (abdominal distention).  You are nauseated or vomiting.  You have a temperature over 101.  You have abdominal pain or discomfort that is severe or gets worse throughout the day.   EGD Discharge instructions Please read the instructions outlined below and refer to this sheet in the next few weeks. These discharge instructions provide you with general information on caring for yourself after you leave the hospital. Your doctor may also give you specific instructions. While your treatment has been planned according to the most current medical practices available, unavoidable complications occasionally occur. If you have any problems or questions after discharge, please call your doctor. ACTIVITY You may resume your regular activity but move at a slower pace for the next 24 hours.  Take frequent rest periods for the next 24 hours.  Walking will help expel (get rid of) the air and reduce the bloated feeling in your abdomen.  No driving for 24 hours (because of the anesthesia (medicine) used during the test).  You may shower.  Do not sign any important legal documents or operate any machinery for 24 hours (because of the anesthesia used during the test).  NUTRITION Drink plenty of fluids.  You may  resume your normal diet.  Begin with a light meal and progress to your normal diet.  Avoid alcoholic beverages for 24 hours or as instructed by your caregiver.  MEDICATIONS You may resume your normal medications unless your caregiver tells you otherwise.  WHAT YOU CAN EXPECT TODAY You may experience abdominal discomfort such as a feeling of fullness or gas pains.  FOLLOW-UP Your doctor will discuss the results of  your test with you.  SEEK IMMEDIATE MEDICAL ATTENTION IF ANY OF THE FOLLOWING OCCUR: Excessive nausea (feeling sick to your stomach) and/or vomiting.  Severe abdominal pain and distention (swelling).  Trouble swallowing.  Temperature over 101 F (37.8 C).  Rectal bleeding or vomiting of blood.     You have a small hiatal hernia.  You had 3 polyps in your colon I removed  Diverticulosis and colon polyp information provided  Further recommendations to follow pending review of pathology report  Continue omeprazole 40 mg every day whether you feel you needed or not  Office visit with [place] Roseanne Kaufman) 6 weeks  At patient request, I called [employer] at [phone] -reviewed findings and recommendations

## 2021-09-23 ENCOUNTER — Encounter: Payer: Self-pay | Admitting: Internal Medicine

## 2021-09-23 LAB — SURGICAL PATHOLOGY

## 2021-09-26 ENCOUNTER — Encounter (HOSPITAL_COMMUNITY): Payer: Self-pay | Admitting: Internal Medicine

## 2021-10-05 DIAGNOSIS — M2550 Pain in unspecified joint: Secondary | ICD-10-CM | POA: Diagnosis not present

## 2021-10-05 DIAGNOSIS — J209 Acute bronchitis, unspecified: Secondary | ICD-10-CM | POA: Diagnosis not present

## 2021-10-05 DIAGNOSIS — J9801 Acute bronchospasm: Secondary | ICD-10-CM | POA: Diagnosis not present

## 2021-10-05 DIAGNOSIS — I10 Essential (primary) hypertension: Secondary | ICD-10-CM | POA: Diagnosis not present

## 2021-11-04 ENCOUNTER — Other Ambulatory Visit: Payer: Self-pay | Admitting: *Deleted

## 2021-11-04 DIAGNOSIS — D649 Anemia, unspecified: Secondary | ICD-10-CM

## 2021-12-19 ENCOUNTER — Encounter: Payer: Self-pay | Admitting: Internal Medicine

## 2021-12-30 ENCOUNTER — Other Ambulatory Visit: Payer: Self-pay | Admitting: Gastroenterology

## 2021-12-31 LAB — CBC WITH DIFFERENTIAL/PLATELET
Basophils Absolute: 0 10*3/uL (ref 0.0–0.2)
Basos: 0 %
EOS (ABSOLUTE): 0.1 10*3/uL (ref 0.0–0.4)
Eos: 1 %
Hematocrit: 33.9 % — ABNORMAL LOW (ref 34.0–46.6)
Hemoglobin: 11.3 g/dL (ref 11.1–15.9)
Immature Grans (Abs): 0 10*3/uL (ref 0.0–0.1)
Immature Granulocytes: 0 %
Lymphocytes Absolute: 1.9 10*3/uL (ref 0.7–3.1)
Lymphs: 32 %
MCH: 30.4 pg (ref 26.6–33.0)
MCHC: 33.3 g/dL (ref 31.5–35.7)
MCV: 91 fL (ref 79–97)
Monocytes Absolute: 0.5 10*3/uL (ref 0.1–0.9)
Monocytes: 8 %
Neutrophils Absolute: 3.5 10*3/uL (ref 1.4–7.0)
Neutrophils: 59 %
Platelets: 262 10*3/uL (ref 150–450)
RBC: 3.72 x10E6/uL — ABNORMAL LOW (ref 3.77–5.28)
RDW: 13.2 % (ref 11.7–15.4)
WBC: 6 10*3/uL (ref 3.4–10.8)

## 2021-12-31 LAB — IRON,TIBC AND FERRITIN PANEL
Ferritin: 386 ng/mL — ABNORMAL HIGH (ref 15–150)
Iron Saturation: 35 % (ref 15–55)
Iron: 106 ug/dL (ref 27–139)
Total Iron Binding Capacity: 302 ug/dL (ref 250–450)
UIBC: 196 ug/dL (ref 118–369)

## 2022-01-01 NOTE — Progress Notes (Signed)
?Cardiology Office Note:   ? ?Date:  01/02/2022  ? ?ID:  Diane Parker, DOB 1955-09-12, MRN [id number] ? ?PCP:  Redmond School, MD ?[place] Cardiologist: Minus Breeding, MD  ? ?Reason for visit: 1 year follow-up ? ?History of Present Illness:   ? ?Diane Parker is a 66 y.o. female with a hx of coronary artery disease, hypertension, hyperlipidemia, palpitations.  Patient was seen by Dr. Percival Spanish in March 2022 with shoulder pain radiating to her arm and chest.  She had a low risk Lexiscan stress.  She was last seen in April 2022 by Levell July NP.  BP was elevated - Asked to monitor.  Recommended to f/u in 6 months. ? ?Today, she states she is here with complaints about skipping heartbeats and heart racing.  She states she has had the heart racing for a while.  She takes diltiazem 180 mg as needed.  She notices her heart beating hard when she lays down especially.  She feels like skipping beat sensation is new for her.  She notices these palpitations more when she takes her tramadol for arthritis.  Otherwise for arthritis, she is taking meloxicam Advil and Voltaren gel.  Approximately once a month she will feel central chest tightness that goes down her arm and she will take a nitroglycerin and rest.  Her pain goes away within an hour.  She wonders if this is radiating pain from her arthritis.  This has been stable and not exertional. ? ?She denies shortness of breath, PND, orthopnea.  No syncope.  She has occasional lower extremity edema with prolonged standing. ? ?  ?Past Medical History:  ?Diagnosis Date  ? Acute diverticulitis 03/28/2013  ? Seen on CT - treated with flagyl an cipro  ? Arthritis   ? "shoulders, knees, hips" (03/29/2017)  ? CAD (coronary artery disease), native coronary artery   ? 03/29/17 PCI/DES to the mRCA, EF normal   ? Essential hypertension   ? GERD (gastroesophageal reflux disease)   ? Headache   ? "bad ones lately; maybe q other day" (03/29/2017)  ? High cholesterol    ? ? ?Past Surgical History:  ?Procedure Laterality Date  ? ABDOMINAL HYSTERECTOMY    ? COLONOSCOPY  11/08/2007  ? [id number], rectosigmoid junction/hyperplastic-appearing polypoid    mucosa, 1 of these areas biopsied.  Otherwise, normal rectum/Left-sided transverse diverticula/Diminutive polyp just distal to the ileocecal valve. Hyperplastic and adenomatous polyp.   ? COLONOSCOPY N/A 06/26/2013  ? QQI:[id number] diverticulosis. Colonic polyps removed  ? COLONOSCOPY WITH PROPOFOL N/A 09/22/2021  ? Procedure: COLONOSCOPY WITH PROPOFOL;  Surgeon: Daneil Dolin, MD;  [facility];  Service: Endoscopy;  Laterality: N/A;  9:30am  ? CORONARY ANGIOPLASTY WITH STENT PLACEMENT  03/29/2017  ? CORONARY STENT INTERVENTION N/A 03/29/2017  ? Procedure: Coronary Stent Intervention;  Surgeon: Lorretta Harp, MD;  Location: [place] [facility];  Service: Cardiovascular;  Laterality: N/A;  ? DILATION AND CURETTAGE OF UTERUS    ? ESOPHAGOGASTRODUODENOSCOPY  2008  ? RMR: normal  ? ESOPHAGOGASTRODUODENOSCOPY (EGD) WITH PROPOFOL N/A 09/22/2021  ? Procedure: ESOPHAGOGASTRODUODENOSCOPY (EGD) WITH PROPOFOL;  Surgeon: Daneil Dolin, MD;  [facility];  Service: Endoscopy;  Laterality: N/A;  ? LEFT HEART CATH AND CORONARY ANGIOGRAPHY N/A 03/29/2017  ? Procedure: Left Heart Cath and Coronary Angiography;  Surgeon: Lorretta Harp, MD;  Location: [place];  Service: Cardiovascular;  Laterality: N/A;  ? POLYPECTOMY  09/22/2021  ? Procedure: POLYPECTOMY;  Surgeon: Gala Romney,  Cristopher Estimable, MD;  [facility]: [facility];  Service: Endoscopy;;  cecal polyp, ascending colon polyp hot snare   ? TUBAL LIGATION    ? ? ?Current Medications: ?Current Meds  ?Medication Sig  ? chlorthalidone (HYGROTON) 25 MG tablet Take 1 tablet (25 mg total) by mouth daily.  ? chlorthalidone (HYGROTON) 25 MG tablet Take 1 tablet (25 mg total) by mouth daily.  ? diltiazem (CARDIZEM LA) 180 MG 24 hr tablet Take 180 mg by mouth daily. Take 1  Tablet Daily  ?  ? ?Allergies:   Pollen extract, Tape, and Penicillins  ? ?Social History  ? ?Socioeconomic History  ? Marital status: Single  ?  Spouse name: Not on file  ? Number of children: Not on file  ? Years of education: Not on file  ? Highest education level: Not on file  ?Occupational History  ? Not on file  ?Tobacco Use  ? Smoking status: Former  ?  Packs/day: 0.12  ?  Years: 3.00  ?  Pack years: 0.36  ?  Types: Cigarettes  ?  Quit date: 53  ?  Years since quitting: 33.3  ? Smokeless tobacco: Never  ?Vaping Use  ? Vaping Use: Never used  ?Substance and Sexual Activity  ? Alcohol use: Not Currently  ?  Comment: 1-2 beer every other day on average, occasional mixed drink on the weekend.  ? Drug use: Yes  ?  Types: Marijuana  ?  Comment: 09/09/21- "1 joint will last 6 months". 03/29/2017 "smoked a little weed when I was young"  ? Sexual activity: Yes  ?  Birth control/protection: Surgical  ?  Comment: tubal/hyst  ?Other Topics Concern  ? Not on file  ?Social History Narrative  ? Not on file  ? ?Social Determinants of Health  ? ?Financial Resource Strain: Low Risk   ? Difficulty of Paying Living Expenses: Not very hard  ?Food Insecurity: No Food Insecurity  ? Worried About Charity fundraiser in the Last Year: Never true  ? Ran Out of Food in the Last Year: Never true  ?Transportation Needs: No Transportation Needs  ? Lack of Transportation (Medical): No  ? Lack of Transportation (Non-Medical): No  ?Physical Activity: Sufficiently Active  ? Days of Exercise per Week: 5 days  ? Minutes of Exercise per Session: 60 min  ?Stress: Stress Concern Present  ? Feeling of Stress : To some extent  ?Social Connections: Moderately Integrated  ? Frequency of Communication with Friends and Family: More than three times a week  ? Frequency of Social Gatherings with Friends and Family: Three times a week  ? Attends Religious Services: 1 to 4 times per year  ? Active Member of Clubs or Organizations: No  ? Attends Theatre manager Meetings: 1 to 4 times per year  ? Marital Status: Never married  ?  ? ?Family History: ?The patient's family history includes Aneurysm in her mother; Cancer in her cousin; Heart disease in her father; Other in her daughter. There is no history of Colon cancer. ? ?ROS:   ?Please see the history of present illness.    ? ?EKGs/Labs/Other Studies Reviewed:   ? ?EKG:  The ekg ordered today demonstrates normal sinus rhythm, minimal voltage criteria for LVH, heart rate 75. ?Recent Labs: ?05/02/2021: TSH 2.300 ?09/09/2021: Hemoglobin 11.2; Platelets 239 ?09/20/2021: BUN 19; Creatinine, Ser 0.90; Potassium 3.8; Sodium 136  ? ?Recent Lipid Panel ?No results found for: CHOL, TRIG, HDL, LDLCALC, LDLDIRECT ? ?Physical Exam:   ? ?  VS:  BP (!) 170/100   Pulse 75   Ht 5' 3.5" (1.613 m)   Wt 171 lb 3.2 oz (77.7 kg)   SpO2 97%   BMI 29.85 kg/m?    ?No data found. ? ?Wt Readings from Last 3 Encounters:  ?01/02/22 171 lb 3.2 oz (77.7 kg)  ?09/22/21 174 lb (78.9 kg)  ?09/20/21 174 lb (78.9 kg)  ?  ? ?GEN:  Well nourished, well developed in no acute distress ?HEENT: Normal ?NECK: No JVD; No carotid bruits ?CARDIAC: RRR, no murmurs, rubs, gallops ?RESPIRATORY:  Clear to auscultation without rales, wheezing or rhonchi  ?ABDOMEN: Soft, non-tender, non-distended ?MUSCULOSKELETAL: No edema; No deformity  ?SKIN: Warm and dry ?NEUROLOGIC:  Alert and oriented ?PSYCHIATRIC:  Normal affect  ? ?  ?ASSESSMENT AND PLAN  ? ?Coronary artery disease, stable ?-s/p DES to RCA ?-Continue beta-blocker, statin and aspirin therapy. ?-She has chronic occasional chest pain which may be arthritis related.  Not exertional. ? ?Palpitations ?-EKG today shows normal rhythm. ?-Heart monitor in October 2020 showed normal sinus rhythm with no arrhythmias. ?-Continue carvedilol 6.25 mg twice daily. ?-Recommend taking diltiazem ER 180 mg daily (right now taking it as as needed). ?-Decrease caffeine use.  She drinks caffeinated tea throughout the  day. ?-Reevaluate in 4 weeks.  If palpitations have not improved, will consider repeating heart monitor / titrating her AV nodal blockers. ? ?Hypertension, BP elevated ?-Recommend stopping hydrochlorothiazide.  Start chlorthalidone 25

## 2022-01-02 ENCOUNTER — Ambulatory Visit (INDEPENDENT_AMBULATORY_CARE_PROVIDER_SITE_OTHER): Payer: No Typology Code available for payment source | Admitting: Physician Assistant

## 2022-01-02 ENCOUNTER — Encounter: Payer: Self-pay | Admitting: Physician Assistant

## 2022-01-02 VITALS — BP 170/100 | HR 75 | Ht 63.5 in | Wt 171.2 lb

## 2022-01-02 DIAGNOSIS — E785 Hyperlipidemia, unspecified: Secondary | ICD-10-CM | POA: Diagnosis not present

## 2022-01-02 DIAGNOSIS — R002 Palpitations: Secondary | ICD-10-CM | POA: Diagnosis not present

## 2022-01-02 DIAGNOSIS — I10 Essential (primary) hypertension: Secondary | ICD-10-CM | POA: Diagnosis not present

## 2022-01-02 DIAGNOSIS — Z9861 Coronary angioplasty status: Secondary | ICD-10-CM | POA: Diagnosis not present

## 2022-01-02 DIAGNOSIS — I2510 Atherosclerotic heart disease of native coronary artery without angina pectoris: Secondary | ICD-10-CM

## 2022-01-02 MED ORDER — CHLORTHALIDONE 25 MG PO TABS: 25.0000 mg | ORAL_TABLET | Freq: Every day | ORAL | 0 refills | Status: DC

## 2022-01-02 MED ORDER — CHLORTHALIDONE 25 MG PO TABS: 25.0000 mg | ORAL_TABLET | Freq: Every day | ORAL | 3 refills | Status: DC

## 2022-01-02 NOTE — Patient Instructions (Signed)
Medication Instructions:  ?Stop HCTZ. ?Start Chlorthalidone 25 mg ( take 1 Tablet Daily). ?*If you need a refill on your cardiac medications before your next appointment, please call your pharmacy* ? ? ?Lab Work: ?None ?If you have labs (blood work) drawn today and your tests are completely normal, you will receive your results only by: ?MyChart Message (if you have MyChart) OR ?A paper copy in the mail ?If you have any lab test that is abnormal or we need to change your treatment, we will call you to review the results. ? ? ?Testing/Procedures: ?None ? ? ?Follow-Up: ?At [facility], you and your health needs are our priority.  As part of our continuing mission to provide you with exceptional heart care, we have created designated Provider Care Teams.  These Care Teams include your primary Cardiologist (physician) and Advanced Practice Providers (APPs -  Physician Assistants and Nurse Practitioners) who all work together to provide you with the care you need, when you need it. ? ?We recommend signing up for the patient portal called "MyChart".  Sign up information is provided on this After Visit Summary.  MyChart is used to connect with patients for Virtual Visits (Telemedicine).  Patients are able to view lab/test results, encounter notes, upcoming appointments, etc.  Non-urgent messages can be sent to your provider as well.   ?To learn more about what you can do with MyChart, go to [email or web address].   ? ?Your next appointment:   ?1 month(s) ? ?The format for your next appointment:   ?In Person ? ?Provider:   ?Caron Presume, PA-C    Then, Minus Breeding, MD will plan to see you again in 6 month(s).  ? ? ?Other Instructions ?Decrease caffeine  consumption. ? ?Important Information About Sugar ? ? ? ? ?  ?

## 2022-01-05 ENCOUNTER — Encounter: Payer: Self-pay | Admitting: Gastroenterology

## 2022-01-05 ENCOUNTER — Ambulatory Visit (INDEPENDENT_AMBULATORY_CARE_PROVIDER_SITE_OTHER): Payer: No Typology Code available for payment source | Admitting: Gastroenterology

## 2022-01-05 VITALS — BP 158/90 | HR 76 | Temp 98.4°F | Ht 63.5 in | Wt 174.2 lb

## 2022-01-05 DIAGNOSIS — K219 Gastro-esophageal reflux disease without esophagitis: Secondary | ICD-10-CM

## 2022-01-05 DIAGNOSIS — D369 Benign neoplasm, unspecified site: Secondary | ICD-10-CM | POA: Diagnosis not present

## 2022-01-05 DIAGNOSIS — K625 Hemorrhage of anus and rectum: Secondary | ICD-10-CM | POA: Diagnosis not present

## 2022-01-05 DIAGNOSIS — R140 Abdominal distension (gaseous): Secondary | ICD-10-CM

## 2022-01-05 NOTE — Progress Notes (Signed)
? ? ? ? ?Gastroenterology Office Note   ? ? ?Primary Care Physician:  Redmond School, MD  ?Primary Gastroenterologist: Dr. Gala Romney ? ? ?Chief Complaint  ? ?Chief Complaint  ?Patient presents with  ? Follow-up  ?  Having some heavy rectal bleeding despite having regular bm's.   ? ? ? ?History of Present Illness  ? ?Diane Parker is a 66 y.o. female presenting today in follow-up with a history of  adenomas,  recently undergoing surveillance colonoscopy with multiple adenomas and one 20 mm adenoma removed completely. Surveillance due in 2026. EGD also completed normal other than small hiatal hernia. Normocytic anemia noted but without iron deficiency. She has been asked to stop iron.  ? ?Omeprazole 40 mg daily. About 3 weeks after colonoscopy had some bleeding then stopped. Hgb 11.3, ferritin 386. Stopped iron. No further rectal bleeding.  ? ? ?Gathering gas in upper abdomen. Will take gas x in morning. Likes hot tea. Occasional soda but not a soda drinker. Loves broccoli. Drinks a whole bottle of water first thing in morning.  ? ? ? ?Past Medical History:  ?Diagnosis Date  ? Acute diverticulitis 03/28/2013  ? Seen on CT - treated with flagyl an cipro  ? Arthritis   ? "shoulders, knees, hips" (03/29/2017)  ? CAD (coronary artery disease), native coronary artery   ? 03/29/17 PCI/DES to the mRCA, EF normal   ? Essential hypertension   ? GERD (gastroesophageal reflux disease)   ? Headache   ? "bad ones lately; maybe q other day" (03/29/2017)  ? High cholesterol   ? ? ?Past Surgical History:  ?Procedure Laterality Date  ? ABDOMINAL HYSTERECTOMY    ? COLONOSCOPY  11/08/2007  ? [id number], rectosigmoid junction/hyperplastic-appearing polypoid    mucosa, 1 of these areas biopsied.  Otherwise, normal rectum/Left-sided transverse diverticula/Diminutive polyp just distal to the ileocecal valve. Hyperplastic and adenomatous polyp.   ? COLONOSCOPY N/A 06/26/2013  ? PTW:[id number] diverticulosis. Colonic polyps removed  ?  COLONOSCOPY WITH PROPOFOL N/A 09/22/2021  ? sigmoid and descending colon diverticulosis, two 3-4 mm polyps in ascending colon and cecum, one 20 mm polyp in ascending colon completely removed. Tubular adenomas. 2026 surveillance.  ? CORONARY ANGIOPLASTY WITH STENT PLACEMENT  03/29/2017  ? CORONARY STENT INTERVENTION N/A 03/29/2017  ? Procedure: Coronary Stent Intervention;  Surgeon: Lorretta Harp, MD;  Location: [facility] [facility];  Service: Cardiovascular;  Laterality: N/A;  ? DILATION AND CURETTAGE OF UTERUS    ? ESOPHAGOGASTRODUODENOSCOPY  2008  ? RMR: normal  ? ESOPHAGOGASTRODUODENOSCOPY (EGD) WITH PROPOFOL N/A 09/22/2021  ? small hiatal hernia, otherwise normal  ? LEFT HEART CATH AND CORONARY ANGIOGRAPHY N/A 03/29/2017  ? Procedure: Left Heart Cath and Coronary Angiography;  Surgeon: Lorretta Harp, MD;  Location: [place] [facility];  Service: Cardiovascular;  Laterality: N/A;  ? POLYPECTOMY  09/22/2021  ? Procedure: POLYPECTOMY;  Surgeon: Daneil Dolin, MD;  [facility];  Service: Endoscopy;;  cecal polyp, ascending colon polyp hot snare   ? TUBAL LIGATION    ? ? ?Current Outpatient Medications  ?Medication Sig Dispense Refill  ? acetaminophen (TYLENOL) 650 MG CR tablet Take 1,300 mg by mouth every 8 (eight) hours as needed for pain.    ? aspirin 81 MG chewable tablet Chew 1 tablet (81 mg total) by mouth daily. (Patient taking differently: Chew 81 mg by mouth every other day.) 30 tablet 0  ? b complex vitamins tablet Take 1 tablet by mouth 2 (two) times a  week.    ? Baclofen 5 MG TABS Take 5 mg by mouth at bedtime.    ? carvedilol (COREG) 6.25 MG tablet Take 1 tablet (6.25 mg total) by mouth 2 (two) times daily. 180 tablet 1  ? cetirizine (ZYRTEC) 10 MG tablet Take 10 mg by mouth daily as needed for allergies.    ? chlorthalidone (HYGROTON) 25 MG tablet Take 1 tablet (25 mg total) by mouth daily. 90 tablet 3  ? Cholecalciferol (VITAMIN D-3) 125 MCG (5000 UT) TABS Take 5,000 Units by mouth  daily at 6 (six) AM.    ? diclofenac sodium (VOLTAREN) 1 % GEL Apply 2 g topically daily as needed (arthritis pain).    ? diltiazem (CARDIZEM LA) 180 MG 24 hr tablet Take 180 mg by mouth daily. Take 1 Tablet Daily    ? estradiol (ESTRACE) 2 MG tablet Take 1 tablet (2 mg total) by mouth daily. 30 tablet 6  ? fluticasone (FLONASE) 50 MCG/ACT nasal spray Place 2 sprays into both nostrils at bedtime as needed for allergies.    ? hydrALAZINE (APRESOLINE) 25 MG tablet Take 1 tablet (25 mg total) by mouth 3 (three) times daily. 270 tablet 3  ? hydrOXYzine (ATARAX) 25 MG tablet Take 25 mg by mouth at bedtime.    ? KLOR-CON M20 20 MEQ tablet Take 20 mEq by mouth daily.    ? meloxicam (MOBIC) 7.5 MG tablet Take 7.5 mg by mouth as needed.    ? montelukast (SINGULAIR) 10 MG tablet Take 10 mg by mouth daily as needed (allergies).    ? Multiple Vitamin (MULTIVITAMIN WITH MINERALS) TABS tablet Take 1 tablet by mouth daily.    ? nitroGLYCERIN (NITROSTAT) 0.4 MG SL tablet Place 1 tablet (0.4 mg total) under the tongue every 5 (five) minutes x 3 doses as needed. 25 tablet 3  ? omeprazole (PRILOSEC) 40 MG capsule Take 40 mg by mouth daily.    ? rosuvastatin (CRESTOR) 40 MG tablet Take 1 tablet (40 mg total) by mouth daily. 90 tablet 3  ? sertraline (ZOLOFT) 25 MG tablet Take 1 tablet (25 mg total) by mouth daily as needed (Hot Flashes). (Patient taking differently: Take 25 mg by mouth daily at 6 (six) AM.) 90 tablet 4  ? traMADol (ULTRAM) 50 MG tablet Take 50 mg by mouth every 6 (six) hours as needed for pain.    ? ?No current facility-administered medications for this visit.  ? ? ?Allergies as of 01/05/2022 - Review Complete 01/05/2022  ?Allergen Reaction Noted  ? Pollen extract Other (See Comments) 04/22/2021  ? Tape  04/22/2021  ? Penicillins Rash 05/30/2019  ? ? ?Family History  ?Problem Relation Age of Onset  ? Heart disease Father   ? Aneurysm Mother   ? Cancer Cousin   ?     breast  ? Other Daughter   ?     PTSD  ? Colon cancer  Neg Hx   ? ? ?Social History  ? ?Socioeconomic History  ? Marital status: Single  ?  Spouse name: Not on file  ? Number of children: Not on file  ? Years of education: Not on file  ? Highest education level: Not on file  ?Occupational History  ? Not on file  ?Tobacco Use  ? Smoking status: Former  ?  Packs/day: 0.12  ?  Years: 3.00  ?  Pack years: 0.36  ?  Types: Cigarettes  ?  Quit date: 37  ?  Years since quitting: 33.3  ?  Smokeless tobacco: Never  ?Vaping Use  ? Vaping Use: Never used  ?Substance and Sexual Activity  ? Alcohol use: Not Currently  ?  Alcohol/week: 6.0 standard drinks  ?  Types: 6 Cans of beer per week  ? Drug use: Yes  ?  Types: Marijuana  ?  Comment: 09/09/21- "1 joint will last 6 months". 03/29/2017 "smoked a little weed when I was young"  ? Sexual activity: Yes  ?  Birth control/protection: Surgical  ?  Comment: tubal/hyst  ?Other Topics Concern  ? Not on file  ?Social History Narrative  ? Not on file  ? ?Social Determinants of Health  ? ?Financial Resource Strain: Low Risk   ? Difficulty of Paying Living Expenses: Not very hard  ?Food Insecurity: No Food Insecurity  ? Worried About Charity fundraiser in the Last Year: Never true  ? Ran Out of Food in the Last Year: Never true  ?Transportation Needs: No Transportation Needs  ? Lack of Transportation (Medical): No  ? Lack of Transportation (Non-Medical): No  ?Physical Activity: Sufficiently Active  ? Days of Exercise per Week: 5 days  ? Minutes of Exercise per Session: 60 min  ?Stress: Stress Concern Present  ? Feeling of Stress : To some extent  ?Social Connections: Moderately Integrated  ? Frequency of Communication with Friends and Family: More than three times a week  ? Frequency of Social Gatherings with Friends and Family: Three times a week  ? Attends Religious Services: 1 to 4 times per year  ? Active Member of Clubs or Organizations: No  ? Attends Archivist Meetings: 1 to 4 times per year  ? Marital Status: Never married   ?Intimate Partner Violence: Not At Risk  ? Fear of Current or Ex-Partner: No  ? Emotionally Abused: No  ? Physically Abused: No  ? Sexually Abused: No  ? ? ? ?Review of Systems  ? ?Gen: Denies any fever, chill

## 2022-01-05 NOTE — Patient Instructions (Signed)
We will repeat your blood work in 3 months! ? ?I have included a handout on how to avoid gas/bloating. Make sure to just sip your water first thing in the morning. ? ?Continue omeprazole daily, 30 minutes before eating.  ? ?We will see you in 6 months! ? ?It was a pleasure to see you today. I want to create trusting relationships with patients to provide genuine, compassionate, and quality care. I value your feedback. If you receive a survey regarding your visit,  I greatly appreciate you taking time to fill this out.  ? ?Annitta Needs, PhD, ANP-BC ?[facility]  ? ?

## 2022-01-26 DIAGNOSIS — H5203 Hypermetropia, bilateral: Secondary | ICD-10-CM | POA: Diagnosis not present

## 2022-01-26 DIAGNOSIS — E7800 Pure hypercholesterolemia, unspecified: Secondary | ICD-10-CM | POA: Diagnosis not present

## 2022-01-26 DIAGNOSIS — I10 Essential (primary) hypertension: Secondary | ICD-10-CM | POA: Diagnosis not present

## 2022-01-26 DIAGNOSIS — Z0100 Encounter for examination of eyes and vision without abnormal findings: Secondary | ICD-10-CM | POA: Diagnosis not present

## 2022-02-01 NOTE — Progress Notes (Addendum)
Cardiology Office Note:    Date:  02/02/2022   ID:  Diane Parker, DOB [birth year], MRN [id number]  PCP:  [facility], [place] Cardiologist: Minus Breeding, MD   Patient location: Home Provider location: Office  Reason for visit: 1 month follow-up  Virtual Visit via Telephone Note  I connected with Diane Parker on 02/02/22 at 10:30 AM EDT by telephone and verified that I am speaking with the correct person using two identifiers.  I discussed the limitations, risks, security and privacy concerns of performing an evaluation and management service by telephone and the availability of in person appointments. I also discussed with the patient that there may be a patient responsible charge related to this service. The patient expressed understanding and agreed to proceed.   History of Present Illness:    Diane Parker is a 66 y.o. female with a hx of coronary artery disease, hypertension, hyperlipidemia, palpitations.  Patient was seen by Dr. Percival Spanish in March 2022 with shoulder pain radiating to her arm and chest.  She had a low risk Lexiscan stress.    I last saw her on Jan 02, 2022 with complaints of skipping heartbeats and heart racing.  She is taking diltiazem 180 mg daily.    Approximately once a month she will feel central chest tightness that goes down her arm and she will take a nitroglycerin and rest.  Her pain goes away within an hour.  She wonders if this is radiating pain from her arthritis.  This has been stable and not exertional.    At that visit, I recommended she take diltiazem daily.  For elevated BP, I recommended stopping hydrochlorothiazide and starting chlorthalidone 25 mg daily.  Today, patient states she has made the switch to chlorthalidone but forgot to take diltiazem daily.  Systolic blood pressure in the mornings elevated 150s to 170s.  Blood pressure typically a little bit better in the afternoons with the lowest readings to 140s.  She  thinks she has less racing heartbeat if she does not take hydralazine.  She has not been consistent with the 3 times a day dosing.  She is interested in alternative medication.  She has tried to decrease her caffeine use to 1 cup of coffee per day.  She has been feeling better overall as she is trying to be more active.  She typically notices less palpitations if she is active.  She did not feel well today after working a lot outside yesterday.  Secondary to headache she had a virtual telephone appointment today.  No chest pain since her last visit.  No shortness of breath.      Past Medical History:  Diagnosis Date   Acute diverticulitis 03/28/2013   Seen on CT - treated with flagyl an cipro   Arthritis    "shoulders, knees, hips" (03/29/2017)   CAD (coronary artery disease), native coronary artery    03/29/17 PCI/DES to the mRCA, EF normal    Essential hypertension    GERD (gastroesophageal reflux disease)    Headache    "bad ones lately; maybe q other day" (03/29/2017)   High cholesterol     Past Surgical History:  Procedure Laterality Date   ABDOMINAL HYSTERECTOMY     COLONOSCOPY  11/08/2007   AST:[id number], rectosigmoid junction/hyperplastic-appearing polypoid    mucosa, 1 of these areas biopsied.  Otherwise, normal rectum/Left-sided transverse diverticula/Diminutive polyp just distal to the ileocecal valve. Hyperplastic and adenomatous polyp.    COLONOSCOPY N/A 06/26/2013  NLZ:JQBHALP diverticulosis. Colonic polyps removed   COLONOSCOPY WITH PROPOFOL N/A 09/22/2021   sigmoid and descending colon diverticulosis, two 3-4 mm polyps in ascending colon and cecum, one 20 mm polyp in ascending colon completely removed. Tubular adenomas. 2026 surveillance.   CORONARY ANGIOPLASTY WITH STENT PLACEMENT  03/29/2017   CORONARY STENT INTERVENTION N/A 03/29/2017   Procedure: Coronary Stent Intervention;  Surgeon: Lorretta Harp, MD;  Location: [facility];  Service: Cardiovascular;   Laterality: N/A;   DILATION AND CURETTAGE OF UTERUS     ESOPHAGOGASTRODUODENOSCOPY  2008   RMR: normal   ESOPHAGOGASTRODUODENOSCOPY (EGD) WITH PROPOFOL N/A 09/22/2021   small hiatal hernia, otherwise normal   LEFT HEART CATH AND CORONARY ANGIOGRAPHY N/A 03/29/2017   Procedure: Left Heart Cath and Coronary Angiography;  Surgeon: Lorretta Harp, MD;  Location: [facility];  Service: Cardiovascular;  Laterality: N/A;   POLYPECTOMY  09/22/2021   Procedure: POLYPECTOMY;  Surgeon: Daneil Dolin, MD;  Location: [facility];  Service: Endoscopy;;  cecal polyp, ascending colon polyp hot snare    TUBAL LIGATION      Current Medications: Current Meds  Medication Sig   acetaminophen (TYLENOL) 650 MG CR tablet Take 1,300 mg by mouth every 8 (eight) hours as needed for pain.   aspirin 81 MG chewable tablet Chew 1 tablet (81 mg total) by mouth daily. (Patient taking differently: Chew 81 mg by mouth every other day.)   b complex vitamins tablet Take 1 tablet by mouth 2 (two) times a week.   Baclofen 5 MG TABS Take 5 mg by mouth at bedtime.   cetirizine (ZYRTEC) 10 MG tablet Take 10 mg by mouth daily as needed for allergies.   chlorthalidone (HYGROTON) 25 MG tablet Take 1 tablet (25 mg total) by mouth daily.   Cholecalciferol (VITAMIN D-3) 125 MCG (5000 UT) TABS Take 5,000 Units by mouth daily at 6 (six) AM.   diclofenac sodium (VOLTAREN) 1 % GEL Apply 2 g topically daily as needed (arthritis pain).   diltiazem (CARDIZEM LA) 180 MG 24 hr tablet Take 180 mg by mouth daily. Take 1 Tablet Daily   estradiol (ESTRACE) 2 MG tablet Take 1 tablet (2 mg total) by mouth daily.   fluticasone (FLONASE) 50 MCG/ACT nasal spray Place 2 sprays into both nostrils at bedtime as needed for allergies.   hydrOXYzine (ATARAX) 25 MG tablet Take 25 mg by mouth at bedtime.   KLOR-CON M20 20 MEQ tablet Take 20 mEq by mouth daily.   meloxicam (MOBIC) 7.5 MG tablet Take 7.5 mg by mouth as needed.   montelukast  (SINGULAIR) 10 MG tablet Take 10 mg by mouth daily as needed (allergies).   Multiple Vitamin (MULTIVITAMIN WITH MINERALS) TABS tablet Take 1 tablet by mouth daily.   nitroGLYCERIN (NITROSTAT) 0.4 MG SL tablet Place 1 tablet (0.4 mg total) under the tongue every 5 (five) minutes x 3 doses as needed.   omeprazole (PRILOSEC) 40 MG capsule Take 40 mg by mouth daily.   rosuvastatin (CRESTOR) 40 MG tablet Take 1 tablet (40 mg total) by mouth daily.   sertraline (ZOLOFT) 50 MG tablet Take 50 mg by mouth daily.   traMADol (ULTRAM) 50 MG tablet Take 50 mg by mouth every 6 (six) hours as needed for pain.   valsartan (DIOVAN) 160 MG tablet Take 1 tablet (160 mg total) by mouth daily.   [DISCONTINUED] hydrALAZINE (APRESOLINE) 25 MG tablet Take 1 tablet (25 mg total) by mouth 3 (three) times daily.  Allergies:   Pollen extract, Tape, and Penicillins   Social History   Socioeconomic History   Marital status: Single    Spouse name: Not on file   Number of children: Not on file   Years of education: Not on file   Highest education level: Not on file  Occupational History   Not on file  Tobacco Use   Smoking status: Former    Packs/day: 0.12    Years: 3.00    Pack years: 0.36    Types: Cigarettes    Quit date: 51    Years since quitting: 33.4   Smokeless tobacco: Never  Vaping Use   Vaping Use: Never used  Substance and Sexual Activity   Alcohol use: Not Currently    Alcohol/week: 6.0 standard drinks    Types: 6 Cans of beer per week   Drug use: Yes    Types: Marijuana    Comment: 09/09/21- "1 joint will last 6 months". 03/29/2017 "smoked a little weed when I was young"   Sexual activity: Yes    Birth control/protection: Surgical    Comment: tubal/hyst  Other Topics Concern   Not on file  Social History Narrative   Not on file   Social Determinants of Health   Financial Resource Strain: Low Risk    Difficulty of Paying Living Expenses: Not very hard  Food Insecurity: No Food  Insecurity   Worried About Charity fundraiser in the Last Year: Never true   Ran Out of Food in the Last Year: Never true  Transportation Needs: No Transportation Needs   Lack of Transportation (Medical): No   Lack of Transportation (Non-Medical): No  Physical Activity: Sufficiently Active   Days of Exercise per Week: 5 days   Minutes of Exercise per Session: 60 min  Stress: Stress Concern Present   Feeling of Stress : To some extent  Social Connections: Moderately Integrated   Frequency of Communication with Friends and Family: More than three times a week   Frequency of Social Gatherings with Friends and Family: Three times a week   Attends Religious Services: 1 to 4 times per year   Active Member of Clubs or Organizations: No   Attends Archivist Meetings: 1 to 4 times per year   Marital Status: Never married     Family History: The patient's family history includes Aneurysm in her mother; Cancer in her cousin; Heart disease in her father; Other in her daughter. There is no history of Colon cancer.  ROS:   Please see the history of present illness.     EKGs/Labs/Other Studies Reviewed:    Recent Labs: 05/02/2021: TSH 2.300 09/20/2021: BUN 19; Creatinine, Ser 0.90; Potassium 3.8; Sodium 136 12/30/2021: Hemoglobin 11.3; Platelets 262   Recent Lipid Panel No results found for: CHOL, TRIG, HDL, LDLCALC, LDLDIRECT  Physical Exam:    Home VS:  BP (!) 181/85   Pulse 75   Ht 5' 3.5" (1.613 m)   Wt 182 lb (82.6 kg)   BMI 31.73 kg/m    No data found.  Wt Readings from Last 3 Encounters:  02/02/22 182 lb (82.6 kg)  01/05/22 174 lb 3.2 oz (79 kg)  01/02/22 171 lb 3.2 oz (77.7 kg)      ASSESSMENT AND PLAN   Coronary artery disease, stable -s/p DES to RCA -Continue beta-blocker, statin and aspirin therapy. -Denies chest pain.   Palpitations, improved -EKG today shows normal rhythm. -Heart monitor in October 2020 showed normal sinus  rhythm with no  arrhythmias. -Continue carvedilol 6.25 mg twice daily. -Recommend taking diltiazem ER 180 mg daily (right now taking it as as needed). -Palpitations improved with decreased caffeine use.   Hypertension, BP elevated -Continue chlorthalidone, Cardizem and carvedilol.  Stop hydralazine & start valsartan 160 mg daily.  Check BMET in 1 week. -History of cough with lisinopril in the past. -Goal BP is <130/80.  Recommend DASH diet (high in vegetables, fruits, low-fat dairy products, whole grains, poultry, fish, and nuts and low in sweets, sugar-sweetened beverages, and red meats), salt restriction and increase physical activity.   Hyperlipidemia with goal LDL less than 70 -LDL 97 in August 2022.  Continue Crestor 40 mg daily.   -Discussed cholesterol lowering diets - Mediterranean diet, DASH diet, vegetarian diet, low-carbohydrate diet and avoidance of trans fats.  Discussed healthier choice substitutes.  Nuts, high-fiber foods, and fiber supplements may also improve lipids.     Disposition - Follow-up in 6 to 8 weeks for blood pressure control.  I provided 20 minutes of non-face-to-face time during this encounter.     Medication Adjustments/Labs and Tests Ordered: Current medicines are reviewed at length with the patient today.  Concerns regarding medicines are outlined above.  Orders Placed This Encounter  Procedures   Basic metabolic panel   Meds ordered this encounter  Medications   valsartan (DIOVAN) 160 MG tablet    Sig: Take 1 tablet (160 mg total) by mouth daily.    Dispense:  30 tablet    Refill:  1    Patient Instructions  Medication Instructions:  Stop Hydralzine. Start Valsartan 160 mg ( Take 1 Tablet Daily). Please Take Diltiazem Daily. *If you need a refill on your cardiac medications before your next appointment, please call your pharmacy*   Lab Work: BMET 1 Week If you have labs (blood work) drawn today and your tests are completely normal, you will receive your  results only by: [place] (if you have MyChart) OR A paper copy in the mail If you have any lab test that is abnormal or we need to change your treatment, we will call you to review the results.   Testing/Procedures: No Testing   Follow-Up: At [facility], you and your health needs are our priority.  As part of our continuing mission to provide you with exceptional heart care, we have created designated Provider Care Teams.  These Care Teams include your primary Cardiologist (physician) and Advanced Practice Providers (APPs -  Physician Assistants and Nurse Practitioners) who all work together to provide you with the care you need, when you need it.  We recommend signing up for the patient portal called "MyChart".  Sign up information is provided on this After Visit Summary.  MyChart is used to connect with patients for Virtual Visits (Telemedicine).  Patients are able to view lab/test results, encounter notes, upcoming appointments, etc.  Non-urgent messages can be sent to your provider as well.   To learn more about what you can do with MyChart, go to [email or web address].    Your next appointment:   6 week(s)  The format for your next appointment:   In Person  Provider:   Caron Presume, PA-C    Then, Minus Breeding, MD will plan to see you again in 6 month(s).      Important Information About Sugar         Signed, Gaston Islam  02/02/2022 11:09 AM    [facility]

## 2022-02-02 ENCOUNTER — Telehealth: Payer: Self-pay | Admitting: Physician Assistant

## 2022-02-02 ENCOUNTER — Telehealth (INDEPENDENT_AMBULATORY_CARE_PROVIDER_SITE_OTHER): Payer: No Typology Code available for payment source | Admitting: Physician Assistant

## 2022-02-02 ENCOUNTER — Encounter: Payer: Self-pay | Admitting: Physician Assistant

## 2022-02-02 VITALS — BP 181/85 | HR 75 | Ht 63.5 in | Wt 182.0 lb

## 2022-02-02 DIAGNOSIS — I10 Essential (primary) hypertension: Secondary | ICD-10-CM | POA: Diagnosis not present

## 2022-02-02 DIAGNOSIS — R002 Palpitations: Secondary | ICD-10-CM

## 2022-02-02 MED ORDER — VALSARTAN 160 MG PO TABS
160.0000 mg | ORAL_TABLET | Freq: Every day | ORAL | 1 refills | Status: DC
Start: 2022-02-02 — End: 2022-03-27

## 2022-02-02 NOTE — Patient Instructions (Addendum)
Medication Instructions:  Stop Hydralazine. Start Valsartan 160 mg ( Take 1 Tablet Daily). Please Take Diltiazem Daily. *If you need a refill on your cardiac medications before your next appointment, please call your pharmacy*   Lab Work: BMET 1 Week If you have labs (blood work) drawn today and your tests are completely normal, you will receive your results only by: [place] (if you have MyChart) OR A paper copy in the mail If you have any lab test that is abnormal or we need to change your treatment, we will call you to review the results.   Testing/Procedures: No Testing   Follow-Up: At [facility], you and your health needs are our priority.  As part of our continuing mission to provide you with exceptional heart care, we have created designated Provider Care Teams.  These Care Teams include your primary Cardiologist (physician) and Advanced Practice Providers (APPs -  Physician Assistants and Nurse Practitioners) who all work together to provide you with the care you need, when you need it.  We recommend signing up for the patient portal called "MyChart".  Sign up information is provided on this After Visit Summary.  MyChart is used to connect with patients for Virtual Visits (Telemedicine).  Patients are able to view lab/test results, encounter notes, upcoming appointments, etc.  Non-urgent messages can be sent to your provider as well.   To learn more about what you can do with MyChart, go to [email or web address].    Your next appointment:   6 week(s)  The format for your next appointment:   In Person  Provider:   Caron Presume, PA-C    Then, Minus Breeding, MD will plan to see you again in 6 month(s).      Important Information About Sugar

## 2022-02-02 NOTE — Telephone Encounter (Signed)
Called patient regarding virtual visit . Advised patient visit will be changes to virtual.

## 2022-02-02 NOTE — Telephone Encounter (Signed)
Patient requesting her appointment today be virtual due to not feeling well and not having transportation.

## 2022-02-27 DIAGNOSIS — I10 Essential (primary) hypertension: Secondary | ICD-10-CM | POA: Diagnosis not present

## 2022-02-27 DIAGNOSIS — R002 Palpitations: Secondary | ICD-10-CM | POA: Diagnosis not present

## 2022-02-28 LAB — BASIC METABOLIC PANEL
BUN/Creatinine Ratio: 18 (ref 12–28)
BUN: 18 mg/dL (ref 8–27)
CO2: 20 mmol/L (ref 20–29)
Calcium: 9.4 mg/dL (ref 8.7–10.3)
Chloride: 101 mmol/L (ref 96–106)
Creatinine, Ser: 0.99 mg/dL (ref 0.57–1.00)
Glucose: 124 mg/dL — ABNORMAL HIGH (ref 70–99)
Potassium: 4 mmol/L (ref 3.5–5.2)
Sodium: 138 mmol/L (ref 134–144)
eGFR: 63 mL/min/{1.73_m2} (ref >59)

## 2022-03-02 ENCOUNTER — Telehealth: Payer: Self-pay

## 2022-03-02 NOTE — Telephone Encounter (Addendum)
Called patient regarding results. Left message for-to call office---- Message from Warren Lacy, PA-C sent at 03/01/2022  2:47 PM EDT ----- Potassium and kidney function remain normal after starting valsartan 160 mg daily.  Continue current treatment.

## 2022-03-02 NOTE — Telephone Encounter (Addendum)
Patient returned call regarding results. Patient had understanding of results.----- Message from Warren Lacy, PA-C sent at 03/01/2022  2:47 PM EDT ----- Potassium and kidney function remain normal after starting valsartan 160 mg daily.  Continue current treatment.

## 2022-03-23 ENCOUNTER — Other Ambulatory Visit: Payer: Self-pay

## 2022-03-23 DIAGNOSIS — K625 Hemorrhage of anus and rectum: Secondary | ICD-10-CM

## 2022-03-27 ENCOUNTER — Other Ambulatory Visit: Payer: Self-pay

## 2022-03-27 MED ORDER — VALSARTAN 160 MG PO TABS: 160.0000 mg | ORAL_TABLET | Freq: Every day | ORAL | 1 refills | Status: DC

## 2022-04-03 ENCOUNTER — Telehealth: Payer: Self-pay | Admitting: Cardiology

## 2022-04-03 NOTE — Telephone Encounter (Signed)
*  STAT* If patient is at the pharmacy, call can be transferred to refill team.   1. Which medications need to be refilled? (please list name of each medication and dose if known)  valsartan (DIOVAN) 160 MG tablet  2. Which pharmacy/location (including street and city if local pharmacy) is medication to be sent to? [facility]   3. Do they need a 30 day or 90 day supply? 2 weeks supply to [facility], and 90 day supply sent to [facility]  Patient is out of medication

## 2022-04-04 MED ORDER — VALSARTAN 160 MG PO TABS: 160.0000 mg | ORAL_TABLET | Freq: Every day | ORAL | 2 refills | Status: DC

## 2022-04-04 MED ORDER — VALSARTAN 160 MG PO TABS: 160.0000 mg | ORAL_TABLET | Freq: Every day | ORAL | 1 refills | Status: DC

## 2022-04-07 ENCOUNTER — Other Ambulatory Visit: Payer: Self-pay

## 2022-04-07 ENCOUNTER — Telehealth: Payer: Self-pay | Admitting: Physician Assistant

## 2022-04-07 ENCOUNTER — Telehealth: Payer: Self-pay

## 2022-04-07 ENCOUNTER — Other Ambulatory Visit: Payer: Self-pay | Admitting: Physician Assistant

## 2022-04-07 MED ORDER — VALSARTAN 160 MG PO TABS: 160.0000 mg | ORAL_TABLET | Freq: Every day | ORAL | 11 refills | Status: DC

## 2022-04-07 NOTE — Telephone Encounter (Signed)
Spoke with pharmacist at [facility] to give order for valsartan '160mg'$  orally daily. Epic system not taking order correctly.

## 2022-04-07 NOTE — Telephone Encounter (Signed)
Pt c/o medication issue:  1. Name of Medication:   valsartan (DIOVAN) 160 MG tablet    2. How are you currently taking this medication (dosage and times per day)? As written   3. Are you having a reaction (difficulty breathing--STAT)? no  4. What is your medication issue? Pharmacy called wanting to know why this prescription refill was denied saying a paper prescription is needed. Please advise.    He said it read "Refill not authorized new prescription cannot be e-prescribed expect paper prescription" and pt is unaware of paper prescription.

## 2022-04-07 NOTE — Telephone Encounter (Signed)
Called patient regarding medication refill. Left message for patient to call office.

## 2022-04-11 DIAGNOSIS — K625 Hemorrhage of anus and rectum: Secondary | ICD-10-CM | POA: Diagnosis not present

## 2022-04-12 LAB — CBC WITH DIFFERENTIAL/PLATELET
Basophils Absolute: 0 10*3/uL (ref 0.0–0.2)
Basos: 0 %
EOS (ABSOLUTE): 0.1 10*3/uL (ref 0.0–0.4)
Eos: 1 %
Hematocrit: 31.6 % — ABNORMAL LOW (ref 34.0–46.6)
Hemoglobin: 10.8 g/dL — ABNORMAL LOW (ref 11.1–15.9)
Immature Grans (Abs): 0 10*3/uL (ref 0.0–0.1)
Immature Granulocytes: 1 %
Lymphocytes Absolute: 2 10*3/uL (ref 0.7–3.1)
Lymphs: 30 %
MCH: 30.3 pg (ref 26.6–33.0)
MCHC: 34.2 g/dL (ref 31.5–35.7)
MCV: 89 fL (ref 79–97)
Monocytes Absolute: 0.6 10*3/uL (ref 0.1–0.9)
Monocytes: 9 %
Neutrophils Absolute: 3.8 10*3/uL (ref 1.4–7.0)
Neutrophils: 59 %
Platelets: 244 10*3/uL (ref 150–450)
RBC: 3.56 x10E6/uL — ABNORMAL LOW (ref 3.77–5.28)
RDW: 12.6 % (ref 11.7–15.4)
WBC: 6.5 10*3/uL (ref 3.4–10.8)

## 2022-04-18 NOTE — Progress Notes (Unsigned)
Cardiology Office Note:    Date:  04/19/2022   ID:  Diane Parker, DOB [birth year], MRN [id number]  PCP:  [facility], [place] Cardiologist: Minus Breeding, MD   Reason for visit: 6-8 week follow-up  History of Present Illness:    Diane Parker is a 66 y.o. female with a hx of coronary artery disease, hypertension, hyperlipidemia, palpitations.  Patient was seen by Dr. Percival Spanish in March 2022 with shoulder pain radiating to her arm and chest.  She had a low risk Lexiscan stress.     Today, she states since taking diltiazem daily, her palpitations have improved.  Since changing her hydralazine to valsartan, her blood pressure has been much better.  Previously she was getting systolic blood pressures 650P to 170s.  Now she says sometimes she runs low to 90s over 60s.  A high reading for her now is 140/70.  She has occasional dizziness.  She wonders if she has been overworking herself in her yard in the heat.  Otherwise, she states no angina like she had with CAD before (shortness of breath, nausea).  She mentions recent mild chest tightness with congestion.  She is planning to start aquatic exercise at the [facility].  She is looking to set up a exercise routine since retiring.    Past Medical History:  Diagnosis Date   Acute diverticulitis 03/28/2013   Seen on CT - treated with flagyl an cipro   Arthritis    "shoulders, knees, hips" (03/29/2017)   CAD (coronary artery disease), native coronary artery    03/29/17 PCI/DES to the mRCA, EF normal    Essential hypertension    GERD (gastroesophageal reflux disease)    Headache    "bad ones lately; maybe q other day" (03/29/2017)   High cholesterol     Past Surgical History:  Procedure Laterality Date   ABDOMINAL HYSTERECTOMY     COLONOSCOPY  11/08/2007   TWS:[id number], rectosigmoid junction/hyperplastic-appearing polypoid    mucosa, 1 of these areas biopsied.  Otherwise, normal rectum/Left-sided transverse  diverticula/Diminutive polyp just distal to the ileocecal valve. Hyperplastic and adenomatous polyp.    COLONOSCOPY N/A 06/26/2013   VCB:SWHQPRF diverticulosis. Colonic polyps removed   COLONOSCOPY WITH PROPOFOL N/A 09/22/2021   sigmoid and descending colon diverticulosis, two 3-4 mm polyps in ascending colon and cecum, one 20 mm polyp in ascending colon completely removed. Tubular adenomas. 2026 surveillance.   CORONARY ANGIOPLASTY WITH STENT PLACEMENT  03/29/2017   CORONARY STENT INTERVENTION N/A 03/29/2017   Procedure: Coronary Stent Intervention;  Surgeon: Lorretta Harp, MD;  Location: [place] [facility];  Service: Cardiovascular;  Laterality: N/A;   DILATION AND CURETTAGE OF UTERUS     ESOPHAGOGASTRODUODENOSCOPY  2008   RMR: normal   ESOPHAGOGASTRODUODENOSCOPY (EGD) WITH PROPOFOL N/A 09/22/2021   small hiatal hernia, otherwise normal   LEFT HEART CATH AND CORONARY ANGIOGRAPHY N/A 03/29/2017   Procedure: Left Heart Cath and Coronary Angiography;  Surgeon: Lorretta Harp, MD;  Location: [place] [facility];  Service: Cardiovascular;  Laterality: N/A;   POLYPECTOMY  09/22/2021   Procedure: POLYPECTOMY;  Surgeon: Daneil Dolin, MD;  [facility];  Service: Endoscopy;;  cecal polyp, ascending colon polyp hot snare    TUBAL LIGATION      Current Medications: Current Meds  Medication Sig   acetaminophen (TYLENOL) 650 MG CR tablet Take 1,300 mg by mouth every 8 (eight) hours as needed for pain.   aspirin 81 MG chewable tablet Chew 1  tablet (81 mg total) by mouth daily. (Patient taking differently: Chew 81 mg by mouth every other day.)   b complex vitamins tablet Take 1 tablet by mouth 2 (two) times a week.   Baclofen 5 MG TABS Take 5 mg by mouth at bedtime.   cetirizine (ZYRTEC) 10 MG tablet Take 10 mg by mouth daily as needed for allergies.   chlorthalidone (HYGROTON) 25 MG tablet Take 0.5 tablets (12.5 mg total) by mouth daily.   Cholecalciferol (VITAMIN D-3) 125 MCG  (5000 UT) TABS Take 5,000 Units by mouth daily at 6 (six) AM.   diclofenac sodium (VOLTAREN) 1 % GEL Apply 2 g topically daily as needed (arthritis pain).   diltiazem (CARDIZEM LA) 180 MG 24 hr tablet Take 180 mg by mouth daily. Take 1 Tablet Daily   estradiol (ESTRACE) 2 MG tablet Take 1 tablet (2 mg total) by mouth daily.   fluticasone (FLONASE) 50 MCG/ACT nasal spray Place 2 sprays into both nostrils at bedtime as needed for allergies.   hydrOXYzine (ATARAX) 25 MG tablet Take 25 mg by mouth at bedtime.   KLOR-CON M20 20 MEQ tablet Take 20 mEq by mouth daily.   meloxicam (MOBIC) 7.5 MG tablet Take 7.5 mg by mouth as needed.   montelukast (SINGULAIR) 10 MG tablet Take 10 mg by mouth daily as needed (allergies).   Multiple Vitamin (MULTIVITAMIN WITH MINERALS) TABS tablet Take 1 tablet by mouth daily.   nitroGLYCERIN (NITROSTAT) 0.4 MG SL tablet Place 1 tablet (0.4 mg total) under the tongue every 5 (five) minutes x 3 doses as needed.   omeprazole (PRILOSEC) 40 MG capsule Take 40 mg by mouth daily.   rosuvastatin (CRESTOR) 40 MG tablet Take 1 tablet (40 mg total) by mouth daily.   sertraline (ZOLOFT) 50 MG tablet Take 50 mg by mouth daily.   traMADol (ULTRAM) 50 MG tablet Take 50 mg by mouth every 6 (six) hours as needed for pain.   valsartan (DIOVAN) 160 MG tablet Take 1 tablet (160 mg total) by mouth daily. (Patient taking differently: Take 160 mg by mouth daily. In The Evening)   [DISCONTINUED] valsartan (DIOVAN) 160 MG tablet Take 1 tablet (160 mg total) by mouth daily.     Allergies:   Pollen extract, Tape, and Penicillins   Social History   Socioeconomic History   Marital status: Single    Spouse name: Not on file   Number of children: Not on file   Years of education: Not on file   Highest education level: Not on file  Occupational History   Not on file  Tobacco Use   Smoking status: Former    Packs/day: 0.12    Years: 3.00    Total pack years: 0.36    Types: Cigarettes     Quit date: 56    Years since quitting: 33.6   Smokeless tobacco: Never  Vaping Use   Vaping Use: Never used  Substance and Sexual Activity   Alcohol use: Not Currently    Alcohol/week: 6.0 standard drinks of alcohol    Types: 6 Cans of beer per week   Drug use: Yes    Types: Marijuana    Comment: 09/09/21- "1 joint will last 6 months". 03/29/2017 "smoked a little weed when I was young"   Sexual activity: Yes    Birth control/protection: Surgical    Comment: tubal/hyst  Other Topics Concern   Not on file  Social History Narrative   Not on file   Social Determinants  of Health   Financial Resource Strain: Low Risk  (04/22/2021)   Overall Financial Resource Strain (CARDIA)    Difficulty of Paying Living Expenses: Not very hard  Food Insecurity: No Food Insecurity (04/22/2021)   Hunger Vital Sign    Worried About Running Out of Food in the Last Year: Never true    Ran Out of Food in the Last Year: Never true  Transportation Needs: No Transportation Needs (04/22/2021)   PRAPARE - Hydrologist (Medical): No    Lack of Transportation (Non-Medical): No  Physical Activity: Sufficiently Active (04/22/2021)   Exercise Vital Sign    Days of Exercise per Week: 5 days    Minutes of Exercise per Session: 60 min  Stress: Stress Concern Present (04/22/2021)   [place]    Feeling of Stress : To some extent  Social Connections: Moderately Integrated (04/22/2021)   Social Connection and Isolation Panel [NHANES]    Frequency of Communication with Friends and Family: More than three times a week    Frequency of Social Gatherings with Friends and Family: Three times a week    Attends Religious Services: 1 to 4 times per year    Active Member of Clubs or Organizations: No    Attends Archivist Meetings: 1 to 4 times per year    Marital Status: Never married     Family History: The patient's  family history includes Aneurysm in her mother; Cancer in her cousin; Heart disease in her father; Other in her daughter. There is no history of Colon cancer.  ROS:   Please see the history of present illness.     EKGs/Labs/Other Studies Reviewed:    Recent Labs: 05/02/2021: TSH 2.300 02/27/2022: BUN 18; Creatinine, Ser 0.99; Potassium 4.0; Sodium 138 04/11/2022: Hemoglobin 10.8; Platelets 244   Recent Lipid Panel No results found for: "CHOL", "TRIG", "HDL", "LDLCALC", "LDLDIRECT"  Physical Exam:    VS:  BP 130/60   Pulse 67   Ht '5\' 3"'$  (1.6 m)   Wt 176 lb (79.8 kg)   SpO2 99%   BMI 31.18 kg/m    No data found.       Wt Readings from Last 3 Encounters:  04/19/22 176 lb (79.8 kg)  02/02/22 182 lb (82.6 kg)  01/05/22 174 lb 3.2 oz (79 kg)     GEN:  Well nourished, well developed in no acute distress HEENT: Normal NECK: No JVD; No carotid bruits CARDIAC: RRR, no murmurs, rubs, gallops RESPIRATORY:  Clear to auscultation without rales, wheezing or rhonchi  ABDOMEN: Soft, non-tender, non-distended MUSCULOSKELETAL: No edema; No deformity  SKIN: Warm and dry NEUROLOGIC:  Alert and oriented PSYCHIATRIC:  Normal affect     ASSESSMENT AND PLAN   Coronary artery disease, stable -s/p DES to RCA -Continue beta-blocker, statin and aspirin therapy. -Denies angina.   Palpitations, improved -EKG today shows normal rhythm. -Heart monitor in October 2020 showed normal sinus rhythm with no arrhythmias. -Continue carvedilol 6.25 mg twice daily and diltiazem ER 180 mg daily  Hypertension, controlled. -With occasional low BP and to avoid dehydration, recommend decreasing chlorthalidone to 12.5 mg daily. -To get more consistent blood pressure control, recommend taking valsartan at night. -History of cough with lisinopril in the past. -Recommend she bring blood pressure log to her follow-up with Dr. Percival Spanish.  If she continues with labile blood pressures, recommend she send readings  in via MyChart/phone call for review. -Goal BP  is <130/80.  Recommend DASH diet (high in vegetables, fruits, low-fat dairy products, whole grains, poultry, fish, and nuts and low in sweets, sugar-sweetened beverages, and red meats), salt restriction and increase physical activity.   Hyperlipidemia with goal LDL less than 70 -LDL 97 in August 2022.  Continue Crestor 40 mg daily.   -She has annual physical with labs this month. -She is working on healthy diet/exercise and exercising at [employer].   Disposition - Follow-up in November as scheduled Dr. Percival Spanish.   Medication Adjustments/Labs and Tests Ordered: Current medicines are reviewed at length with the patient today.  Concerns regarding medicines are outlined above.  No orders of the defined types were placed in this encounter.  Meds ordered this encounter  Medications   valsartan (DIOVAN) 160 MG tablet    Sig: Take 1 tablet (160 mg total) by mouth daily.    Dispense:  30 tablet    Refill:  11   chlorthalidone (HYGROTON) 25 MG tablet    Sig: Take 0.5 tablets (12.5 mg total) by mouth daily.    Dispense:  135 tablet    Refill:  2    Patient Instructions  Medication Instructions:  Decrease Chlorthalidone  to 12.5 mg ( Take Half of a 25 mg Tablet Daily).  *If you need a refill on your cardiac medications before your next appointment, please call your pharmacy*   Lab Work: No Labs If you have labs (blood work) drawn today and your tests are completely normal, you will receive your results only by: [place] (if you have MyChart) OR A paper copy in the mail If you have any lab test that is abnormal or we need to change your treatment, we will call you to review the results.   Testing/Procedures: No Testing   Follow-Up: At [facility], you and your health needs are our priority.  As part of our continuing mission to provide you with exceptional heart care, we have created designated Provider Care Teams.  These Care  Teams include your primary Cardiologist (physician) and Advanced Practice Providers (APPs -  Physician Assistants and Nurse Practitioners) who all work together to provide you with the care you need, when you need it.  We recommend signing up for the patient portal called "MyChart".  Sign up information is provided on this After Visit Summary.  MyChart is used to connect with patients for Virtual Visits (Telemedicine).  Patients are able to view lab/test results, encounter notes, upcoming appointments, etc.  Non-urgent messages can be sent to your provider as well.   To learn more about what you can do with MyChart, go to [email or web address].    Your next appointment:   Keep Scheduled Appointment  The format for your next appointment:   In Person  Provider:   Minus Breeding, MD     Other Instructions Bring Blood Pressure Log to Follow up Visit.  Important Information About Sugar         Signed, Gaston Islam  04/19/2022 11:21 AM    [facility]

## 2022-04-19 ENCOUNTER — Encounter: Payer: Self-pay | Admitting: Physician Assistant

## 2022-04-19 ENCOUNTER — Other Ambulatory Visit: Payer: Self-pay | Admitting: *Deleted

## 2022-04-19 ENCOUNTER — Ambulatory Visit (INDEPENDENT_AMBULATORY_CARE_PROVIDER_SITE_OTHER): Payer: No Typology Code available for payment source | Admitting: Physician Assistant

## 2022-04-19 VITALS — BP 130/60 | HR 67 | Ht 63.0 in | Wt 176.0 lb

## 2022-04-19 DIAGNOSIS — R002 Palpitations: Secondary | ICD-10-CM

## 2022-04-19 DIAGNOSIS — I10 Essential (primary) hypertension: Secondary | ICD-10-CM

## 2022-04-19 DIAGNOSIS — I2510 Atherosclerotic heart disease of native coronary artery without angina pectoris: Secondary | ICD-10-CM | POA: Diagnosis not present

## 2022-04-19 DIAGNOSIS — D509 Iron deficiency anemia, unspecified: Secondary | ICD-10-CM

## 2022-04-19 DIAGNOSIS — Z9861 Coronary angioplasty status: Secondary | ICD-10-CM | POA: Diagnosis not present

## 2022-04-19 DIAGNOSIS — E785 Hyperlipidemia, unspecified: Secondary | ICD-10-CM | POA: Diagnosis not present

## 2022-04-19 MED ORDER — VALSARTAN 160 MG PO TABS: 160.0000 mg | ORAL_TABLET | Freq: Every day | ORAL | 11 refills | Status: DC

## 2022-04-19 MED ORDER — CHLORTHALIDONE 25 MG PO TABS: 12.5000 mg | ORAL_TABLET | Freq: Every day | ORAL | 2 refills | Status: DC

## 2022-04-19 NOTE — Patient Instructions (Signed)
Medication Instructions:  Decrease Chlorthalidone  to 12.5 mg ( Take Half of a 25 mg Tablet Daily).  *If you need a refill on your cardiac medications before your next appointment, please call your pharmacy*   Lab Work: No Labs If you have labs (blood work) drawn today and your tests are completely normal, you will receive your results only by: [place] (if you have MyChart) OR A paper copy in the mail If you have any lab test that is abnormal or we need to change your treatment, we will call you to review the results.   Testing/Procedures: No Testing   Follow-Up: At [facility], you and your health needs are our priority.  As part of our continuing mission to provide you with exceptional heart care, we have created designated Provider Care Teams.  These Care Teams include your primary Cardiologist (physician) and Advanced Practice Providers (APPs -  Physician Assistants and Nurse Practitioners) who all work together to provide you with the care you need, when you need it.  We recommend signing up for the patient portal called "MyChart".  Sign up information is provided on this After Visit Summary.  MyChart is used to connect with patients for Virtual Visits (Telemedicine).  Patients are able to view lab/test results, encounter notes, upcoming appointments, etc.  Non-urgent messages can be sent to your provider as well.   To learn more about what you can do with MyChart, go to [email or web address].    Your next appointment:   Keep Scheduled Appointment  The format for your next appointment:   In Person  Provider:   Minus Breeding, MD     Other Instructions Bring Blood Pressure Log to Follow up Visit.  Important Information About Sugar

## 2022-04-24 ENCOUNTER — Ambulatory Visit: Payer: No Typology Code available for payment source | Admitting: Adult Health

## 2022-05-11 DIAGNOSIS — Z6831 Body mass index (BMI) 31.0-31.9, adult: Secondary | ICD-10-CM | POA: Diagnosis not present

## 2022-05-11 DIAGNOSIS — M2550 Pain in unspecified joint: Secondary | ICD-10-CM | POA: Diagnosis not present

## 2022-05-11 DIAGNOSIS — D509 Iron deficiency anemia, unspecified: Secondary | ICD-10-CM | POA: Diagnosis not present

## 2022-05-11 DIAGNOSIS — G894 Chronic pain syndrome: Secondary | ICD-10-CM | POA: Diagnosis not present

## 2022-05-11 DIAGNOSIS — K219 Gastro-esophageal reflux disease without esophagitis: Secondary | ICD-10-CM | POA: Diagnosis not present

## 2022-05-11 DIAGNOSIS — I10 Essential (primary) hypertension: Secondary | ICD-10-CM | POA: Diagnosis not present

## 2022-05-11 DIAGNOSIS — E6609 Other obesity due to excess calories: Secondary | ICD-10-CM | POA: Diagnosis not present

## 2022-05-11 DIAGNOSIS — M5030 Other cervical disc degeneration, unspecified cervical region: Secondary | ICD-10-CM | POA: Diagnosis not present

## 2022-05-11 DIAGNOSIS — J329 Chronic sinusitis, unspecified: Secondary | ICD-10-CM | POA: Diagnosis not present

## 2022-05-12 LAB — IRON,TIBC AND FERRITIN PANEL
Ferritin: 536 ng/mL — ABNORMAL HIGH (ref 15–150)
Iron Saturation: 23 % (ref 15–55)
Iron: 92 ug/dL (ref 27–139)
Total Iron Binding Capacity: 405 ug/dL (ref 250–450)
UIBC: 313 ug/dL (ref 118–369)

## 2022-06-06 ENCOUNTER — Telehealth: Payer: Self-pay | Admitting: Adult Health

## 2022-06-06 ENCOUNTER — Encounter: Payer: Self-pay | Admitting: Adult Health

## 2022-06-06 ENCOUNTER — Ambulatory Visit (INDEPENDENT_AMBULATORY_CARE_PROVIDER_SITE_OTHER): Payer: No Typology Code available for payment source | Admitting: Adult Health

## 2022-06-06 VITALS — BP 183/84 | HR 59 | Ht 63.0 in | Wt 175.5 lb

## 2022-06-06 DIAGNOSIS — Z79899 Other long term (current) drug therapy: Secondary | ICD-10-CM

## 2022-06-06 DIAGNOSIS — Z01419 Encounter for gynecological examination (general) (routine) without abnormal findings: Secondary | ICD-10-CM | POA: Diagnosis not present

## 2022-06-06 DIAGNOSIS — Z90710 Acquired absence of both cervix and uterus: Secondary | ICD-10-CM

## 2022-06-06 DIAGNOSIS — R232 Flushing: Secondary | ICD-10-CM

## 2022-06-06 DIAGNOSIS — Z1211 Encounter for screening for malignant neoplasm of colon: Secondary | ICD-10-CM

## 2022-06-06 DIAGNOSIS — F419 Anxiety disorder, unspecified: Secondary | ICD-10-CM | POA: Diagnosis not present

## 2022-06-06 LAB — HEMOCCULT GUIAC POC 1CARD (OFFICE): Fecal Occult Blood, POC: NEGATIVE

## 2022-06-06 MED ORDER — ESTRADIOL 2 MG PO TABS: 2.0000 mg | ORAL_TABLET | Freq: Every day | ORAL | 4 refills | Status: DC

## 2022-06-06 MED ORDER — SERTRALINE HCL 50 MG PO TABS: 50.0000 mg | ORAL_TABLET | Freq: Every day | ORAL | 4 refills | Status: AC

## 2022-06-06 NOTE — Progress Notes (Signed)
Patient ID: Diane Parker, female   DOB: [birth year], 66 y.o.   MRN: [id number] History of Present Illness: Diane Parker is a 66 year old black female,single, sp hysterectomy in for well woman gyn exam and refill estrace and Zoloft.  PCP is Dr Gerarda Fraction.   Current Medications, Allergies, Past Medical History, Past Surgical History, Family History and Social History were reviewed in [facility] record.     Review of Systems: Patient denies any headaches, hearing loss, fatigue, blurred vision, shortness of breath, chest pain, abdominal pain, problems with bowel movements, urination, or intercourse. No joint pain or mood swings.  Has body aches at times, thinks it is arthritis   Physical Exam:BP (!) 183/84 (BP Location: Left Arm, Patient Position: Sitting, Cuff Size: Normal)   Pulse (!) 59   Ht '5\' 3"'$  (1.6 m)   Wt 175 lb 8 oz (79.6 kg)   BMI 31.09 kg/m   General:  Well developed, well nourished, no acute distress Skin:  Warm and dry Neck:  Midline trachea, normal thyroid, good ROM, no lymphadenopathy,no carotid bruits heard Lungs; Clear to auscultation bilaterally Breast:  No dominant palpable mass, retraction, or nipple discharge Cardiovascular: Regular rate and rhythm Abdomen:  Soft, non tender, no hepatosplenomegaly Pelvic:  External genitalia is normal in appearance, no lesions.  The vagina is pale but moist. Urethra has no lesions or masses. The cervix and uterus are absent. No adnexal masses or tenderness noted.Bladder is non tender, no masses felt. Rectal: Good sphincter tone, no polyps, or hemorrhoids felt.  Hemoccult negative. Extremities/musculoskeletal:  No swelling or varicosities noted, no clubbing or cyanosis Psych:  No mood changes, alert and cooperative,seems happy AA is 5 Fallr isk is low    06/06/2022   11:08 AM 04/22/2021    8:56 AM 04/21/2020   10:35 AM  Depression screen PHQ 2/9  Decreased Interest 0 0 0  Down, Depressed, Hopeless 0 0 0  PHQ - 2  Score 0 0 0  Altered sleeping '3 3 3  '$ Tired, decreased energy 0 1 3  Change in appetite 0 0 0  Feeling bad or failure about yourself  0 0 0  Trouble concentrating 0 0 0  Moving slowly or fidgety/restless 0 0 0  Suicidal thoughts 0 0 0  PHQ-9 Score '3 4 6  '$ Difficult doing work/chores   Not difficult at all       06/06/2022   11:08 AM 04/22/2021    8:56 AM 04/21/2020   10:37 AM  GAD 7 : Generalized Anxiety Score  Nervous, Anxious, on Edge 0 1 0  Control/stop worrying 0 0 0  Worry too much - different things 0 0 0  Trouble relaxing 1 1 0  Restless 0 0 0  Easily annoyed or irritable 1 0 0  Afraid - awful might happen 0 0 0  Total GAD 7 Score 2 2 0      Upstream - 06/06/22 1117       Pregnancy Intention Screening   Does the patient want to become pregnant in the next year? N/A    Does the patient's partner want to become pregnant in the next year? N/A    Would the patient like to discuss contraceptive options today? N/A      Contraception Wrap Up   Current Method Female Sterilization   hyst   End Method Female Sterilization   hyst   Contraception Counseling Provided No  Examination chaperoned by Levy Pupa LPN  Impression and Plan: 1. Encounter for well woman exam with routine gynecological exam Physical in 1 year Mammogram was negative for malignancy 06/28/21 Labs with PCP Colonoscopy per GI  2. S/P hysterectomy  3. Current use of estrogen therapy She wants to continue estrace 2 mg 1 daily, will refill Meds ordered this encounter  Medications   sertraline (ZOLOFT) 50 MG tablet    Sig: Take 1 tablet (50 mg total) by mouth daily.    Dispense:  90 tablet    Refill:  4    Order Specific Question:   Supervising Provider    Answer:   Tania Ade H [[id number]]   estradiol (ESTRACE) 2 MG tablet    Sig: Take 1 tablet (2 mg total) by mouth daily.    Dispense:  90 tablet    Refill:  4    Order Specific Question:   Supervising Provider    Answer:   Elonda Husky,  LUTHER H [[id number]]     4. Encounter for screening fecal occult blood testing Hemoccult was negative   5. Hot flashes Has flashes if does not take Estrace  6. Anxiety On Zoloft 50 mg 1 daily and doing well, will refill

## 2022-06-09 DIAGNOSIS — I10 Essential (primary) hypertension: Secondary | ICD-10-CM | POA: Diagnosis not present

## 2022-06-09 DIAGNOSIS — M5030 Other cervical disc degeneration, unspecified cervical region: Secondary | ICD-10-CM | POA: Diagnosis not present

## 2022-06-09 DIAGNOSIS — K219 Gastro-esophageal reflux disease without esophagitis: Secondary | ICD-10-CM | POA: Diagnosis not present

## 2022-06-09 DIAGNOSIS — Z0001 Encounter for general adult medical examination with abnormal findings: Secondary | ICD-10-CM | POA: Diagnosis not present

## 2022-06-09 DIAGNOSIS — Z6831 Body mass index (BMI) 31.0-31.9, adult: Secondary | ICD-10-CM | POA: Diagnosis not present

## 2022-06-09 DIAGNOSIS — Z1331 Encounter for screening for depression: Secondary | ICD-10-CM | POA: Diagnosis not present

## 2022-06-09 DIAGNOSIS — E6609 Other obesity due to excess calories: Secondary | ICD-10-CM | POA: Diagnosis not present

## 2022-06-09 DIAGNOSIS — I700 Atherosclerosis of aorta: Secondary | ICD-10-CM | POA: Diagnosis not present

## 2022-07-04 NOTE — Progress Notes (Unsigned)
Cardiology Office Note   Date:  07/05/2022   ID:  Diane Parker, DOB [birth year], MRN [id number]  PCP:  Redmond School, MD  Cardiologist:   Minus Breeding, MD   No chief complaint on file.     History of Present Illness: Diane Parker is a 66 y.o. female who presents for who was seen by Dr. Bronson Ing with a history of CAD (RCA stent placement for mid RCA 99% stenosis 03/29/2017, also stenosis of 90% and ostium of first diagonal branch), HTN, HLD, palpitations.  When I saw her in 2022 she had chest pain.  Lexiscan Myoview was low risk.    Since I last saw her she has been seen by our clinic APPs.   She had some low BP and had her chlorthalidone was reduced.  She says that she is having aches all over.  She describes it as arthritis.  She says its in her joints and her muscles.  She says this is slowly getting worse.  She had some swelling last week.  She said she took extra chlorthalidone.  Of note the last time she saw she actually had to have her chlorthalidone reduced because her blood pressures were dropping.  She said her swelling did go back down to baseline.  She was also using a lot of salt she thought.  She cut this down.  She has noticed her blood pressure is going up to the 160 to 170 range consistently.  She was getting better blood pressure control.  She denies any new palpitations, presyncope or syncope.  She has had no new chest pressure, neck or arm discomfort.   Past Medical History:  Diagnosis Date   Acute diverticulitis 03/28/2013   Seen on CT - treated with flagyl an cipro   Arthritis    "shoulders, knees, hips" (03/29/2017)   CAD (coronary artery disease), native coronary artery    03/29/17 PCI/DES to the mRCA, EF normal    Essential hypertension    GERD (gastroesophageal reflux disease)    Headache    "bad ones lately; maybe q other day" (03/29/2017)   High cholesterol     Past Surgical History:  Procedure Laterality Date   ABDOMINAL HYSTERECTOMY      COLONOSCOPY  11/08/2007   FBP:[id number], rectosigmoid junction/hyperplastic-appearing polypoid    mucosa, 1 of these areas biopsied.  Otherwise, normal rectum/Left-sided transverse diverticula/Diminutive polyp just distal to the ileocecal valve. Hyperplastic and adenomatous polyp.    COLONOSCOPY N/A 06/26/2013   MPN:TIRWERX diverticulosis. Colonic polyps removed   COLONOSCOPY WITH PROPOFOL N/A 09/22/2021   sigmoid and descending colon diverticulosis, two 3-4 mm polyps in ascending colon and cecum, one 20 mm polyp in ascending colon completely removed. Tubular adenomas. 2026 surveillance.   CORONARY ANGIOPLASTY WITH STENT PLACEMENT  03/29/2017   CORONARY STENT INTERVENTION N/A 03/29/2017   Procedure: Coronary Stent Intervention;  Surgeon: Lorretta Harp, MD;  Location: [facility];  Service: Cardiovascular;  Laterality: N/A;   DILATION AND CURETTAGE OF UTERUS     ESOPHAGOGASTRODUODENOSCOPY  2008   RMR: normal   ESOPHAGOGASTRODUODENOSCOPY (EGD) WITH PROPOFOL N/A 09/22/2021   small hiatal hernia, otherwise normal   LEFT HEART CATH AND CORONARY ANGIOGRAPHY N/A 03/29/2017   Procedure: Left Heart Cath and Coronary Angiography;  Surgeon: Lorretta Harp, MD;  Location: [facility];  Service: Cardiovascular;  Laterality: N/A;   POLYPECTOMY  09/22/2021   Procedure: POLYPECTOMY;  Surgeon: Daneil Dolin, MD;  [facility];  Service: Endoscopy;;  cecal polyp, ascending colon polyp hot snare    TUBAL LIGATION       Current Outpatient Medications  Medication Sig Dispense Refill   acetaminophen (TYLENOL) 650 MG CR tablet Take 1,300 mg by mouth every 8 (eight) hours as needed for pain.     aspirin 81 MG chewable tablet Chew 1 tablet (81 mg total) by mouth daily. (Patient taking differently: Chew 81 mg by mouth every other day.) 30 tablet 0   b complex vitamins tablet Take 1 tablet by mouth 2 (two) times a week.     Baclofen 5 MG TABS Take 5 mg by mouth at bedtime.      cetirizine (ZYRTEC) 10 MG tablet Take 10 mg by mouth daily as needed for allergies.     chlorthalidone (HYGROTON) 25 MG tablet Take 0.5 tablets (12.5 mg total) by mouth daily. 135 tablet 2   Cholecalciferol (VITAMIN D-3) 125 MCG (5000 UT) TABS Take 5,000 Units by mouth daily at 6 (six) AM.     diclofenac sodium (VOLTAREN) 1 % GEL Apply 2 g topically daily as needed (arthritis pain).     diltiazem (CARDIZEM LA) 180 MG 24 hr tablet Take 180 mg by mouth daily. Take 1 Tablet Daily     estradiol (ESTRACE) 2 MG tablet Take 1 tablet (2 mg total) by mouth daily. 90 tablet 4   fluticasone (FLONASE) 50 MCG/ACT nasal spray Place 2 sprays into both nostrils at bedtime as needed for allergies.     HYDROcodone bit-homatropine (HYCODAN) 5-1.5 MG/5ML syrup Take 5 mLs by mouth 4 (four) times daily as needed.     hydrOXYzine (ATARAX) 25 MG tablet Take 25 mg by mouth at bedtime.     KLOR-CON M20 20 MEQ tablet Take 20 mEq by mouth daily.     meloxicam (MOBIC) 7.5 MG tablet Take 7.5 mg by mouth as needed.     Multiple Vitamin (MULTIVITAMIN WITH MINERALS) TABS tablet Take 1 tablet by mouth daily.     nitroGLYCERIN (NITROSTAT) 0.4 MG SL tablet Place 1 tablet (0.4 mg total) under the tongue every 5 (five) minutes x 3 doses as needed. 25 tablet 3   omeprazole (PRILOSEC) 40 MG capsule Take 40 mg by mouth daily.     rosuvastatin (CRESTOR) 40 MG tablet Take 1 tablet (40 mg total) by mouth daily. 90 tablet 3   sertraline (ZOLOFT) 50 MG tablet Take 1 tablet (50 mg total) by mouth daily. 90 tablet 4   traMADol (ULTRAM) 50 MG tablet Take 50 mg by mouth 4 (four) times daily as needed.     carvedilol (COREG) 6.25 MG tablet Take 1 tablet (6.25 mg total) by mouth 2 (two) times daily. 180 tablet 1   valsartan (DIOVAN) 80 MG tablet Take 1.5 tablets (120 mg total) by mouth 2 (two) times daily. 270 tablet 3   No current facility-administered medications for this visit.    Allergies:   Pollen extract, Tape, and Penicillins    ROS:   Please see the history of present illness.   Otherwise, review of systems are positive for none.   All other systems are reviewed and negative.    PHYSICAL EXAM: VS:  BP (!) 178/90   Pulse 61   Ht '5\' 3"'$  (1.6 m)   Wt 175 lb 6.4 oz (79.6 kg)   SpO2 98%   BMI 31.07 kg/m  , BMI Body mass index is 31.07 kg/m. GENERAL:  Well appearing NECK:  No jugular venous distention, waveform  within normal limits, carotid upstroke brisk and symmetric, no bruits, no thyromegaly LUNGS:  Clear to auscultation bilaterally CHEST:  Unremarkable HEART:  PMI not displaced or sustained,S1 and S2 within normal limits, no S3, no S4, no clicks, no rubs, no murmurs ABD:  Flat, positive bowel sounds normal in frequency in pitch, no bruits, no rebound, no guarding, no midline pulsatile mass, no hepatomegaly, no splenomegaly EXT:  2 plus pulses throughout, no edema, no cyanosis no clubbing   EKG:  EKG is not ordered today.    Recent Labs: 02/27/2022: BUN 18; Creatinine, Ser 0.99; Potassium 4.0; Sodium 138 04/11/2022: Hemoglobin 10.8; Platelets 244    Lipid Panel No results found for: "CHOL", "TRIG", "HDL", "CHOLHDL", "VLDL", "LDLCALC", "LDLDIRECT"    Wt Readings from Last 3 Encounters:  07/05/22 175 lb 6.4 oz (79.6 kg)  06/06/22 175 lb 8 oz (79.6 kg)  04/19/22 176 lb (79.8 kg)      Other studies Reviewed: Additional studies/ records that were reviewed today include: Labs. Review of the above records demonstrates:  Please see elsewhere in the note.     ASSESSMENT AND PLAN:  Coronary artery disease, stable:  The patient has no new sypmtoms.  No further cardiovascular testing is indicated.  We will continue with aggressive risk reduction and meds as listed.  She needs continued secondary risk reduction.   Palpitations, improved: She has had no new symptoms.  Hypertension : Her blood pressure is not controlled.  I Minna increase her valsartan 120 mg twice daily.  Muscle aches:, To see if this is related  to the Crestor and I will have her hold her Crestor for 4 weeks.  If there is no improvement she can go back on the Crestor.  I have actually asked her to rechallenge herself with Crestor if her aches and pains happen to go away.  I want to make sure that the statin is causally related before discontinuing it.  If we do discontinue it she would probably need PCSK9   hyperlipidemia: As above.   Current medicines are reviewed at length with the patient today.  The patient does not have concerns regarding medicines.  The following changes have been made: As above  Labs/ tests ordered today include:  No orders of the defined types were placed in this encounter.    Disposition:   FU with APP in 6 months   Signed, Minus Breeding, MD  07/05/2022 12:14 PM    [place]

## 2022-07-05 ENCOUNTER — Ambulatory Visit: Payer: No Typology Code available for payment source | Attending: Cardiology | Admitting: Cardiology

## 2022-07-05 ENCOUNTER — Encounter: Payer: Self-pay | Admitting: Cardiology

## 2022-07-05 VITALS — BP 178/90 | HR 61 | Ht 63.0 in | Wt 175.4 lb

## 2022-07-05 DIAGNOSIS — I10 Essential (primary) hypertension: Secondary | ICD-10-CM | POA: Diagnosis not present

## 2022-07-05 DIAGNOSIS — Z9861 Coronary angioplasty status: Secondary | ICD-10-CM

## 2022-07-05 DIAGNOSIS — R002 Palpitations: Secondary | ICD-10-CM

## 2022-07-05 DIAGNOSIS — E785 Hyperlipidemia, unspecified: Secondary | ICD-10-CM | POA: Diagnosis not present

## 2022-07-05 DIAGNOSIS — I2510 Atherosclerotic heart disease of native coronary artery without angina pectoris: Secondary | ICD-10-CM

## 2022-07-05 MED ORDER — VALSARTAN 80 MG PO TABS: 120.0000 mg | ORAL_TABLET | Freq: Two times a day (BID) | ORAL | 3 refills | Status: DC

## 2022-07-05 NOTE — Patient Instructions (Addendum)
Medication Instructions:  You may take a 4 week break from your Rosuvastatin. Please call us after the 4 week period at [phone]  INCREASE your Valsartan to 120 mg twice a day (this will be one and a half tablets of the 80 mg tablet)  *If you need a refill on your cardiac medications before your next appointment, please call your pharmacy*   Lab Work: None ordered If you have labs (blood work) drawn today and your tests are completely normal, you will receive your results only by: [place] (if you have MyChart) OR A paper copy in the mail If you have any lab test that is abnormal or we need to change your treatment, we will call you to review the results.   Testing/Procedures: None ordered   Follow-Up: At [facility], you and your health needs are our priority.  As part of our continuing mission to provide you with exceptional heart care, we have created designated Provider Care Teams.  These Care Teams include your primary Cardiologist (physician) and Advanced Practice Providers (APPs -  Physician Assistants and Nurse Practitioners) who all work together to provide you with the care you need, when you need it.  We recommend signing up for the patient portal called "MyChart".  Sign up information is provided on this After Visit Summary.  MyChart is used to connect with patients for Virtual Visits (Telemedicine).  Patients are able to view lab/test results, encounter notes, upcoming appointments, etc.  Non-urgent messages can be sent to your provider as well.   To learn more about what you can do with MyChart, go to [email or web address].    Your next appointment:   6 month(s)  The format for your next appointment:   In Person  Provider:   APP  Other Instructions Dr. Percival Spanish would like you to check your blood pressure daily for the next 2 weeks.  Keep a journal of these daily blood pressure and heart rate readings and call our office or send a message  through [place] with the results. Thank you!  It is best to check your BP 1-2 hours after taking your medications to see the medications effectiveness on your BP.    Here are some tips that our clinical pharmacists share for home BP monitoring:          Rest 10 minutes before taking your blood pressure.          Don't smoke or drink caffeinated beverages for at least 30 minutes before.          Take your blood pressure before (not after) you eat.          Sit comfortably with your back supported and both feet on the floor (don't cross your legs).          Elevate your arm to heart level on a table or a desk.          Use the proper sized cuff. It should fit smoothly and snugly around your bare upper arm. There should be enough room to slip a fingertip under the cuff. The bottom edge of the cuff should be 1 inch above the crease of the elbow.

## 2022-07-07 DIAGNOSIS — E559 Vitamin D deficiency, unspecified: Secondary | ICD-10-CM | POA: Diagnosis not present

## 2022-07-07 DIAGNOSIS — E538 Deficiency of other specified B group vitamins: Secondary | ICD-10-CM | POA: Diagnosis not present

## 2022-07-07 DIAGNOSIS — I10 Essential (primary) hypertension: Secondary | ICD-10-CM | POA: Diagnosis not present

## 2022-07-07 DIAGNOSIS — E039 Hypothyroidism, unspecified: Secondary | ICD-10-CM | POA: Diagnosis not present

## 2022-07-07 DIAGNOSIS — D518 Other vitamin B12 deficiency anemias: Secondary | ICD-10-CM | POA: Diagnosis not present

## 2022-07-07 DIAGNOSIS — Z0001 Encounter for general adult medical examination with abnormal findings: Secondary | ICD-10-CM | POA: Diagnosis not present

## 2022-07-10 NOTE — Progress Notes (Signed)
Pt called regarding her results which we had tried to reach the pt in Sept. / also reminded her of her appt with Vicente Males on Wednesday @ 9:30am and the location of her visit. Pt stated she didn't know of this appt

## 2022-07-12 ENCOUNTER — Encounter: Payer: Self-pay | Admitting: Gastroenterology

## 2022-07-12 ENCOUNTER — Other Ambulatory Visit: Payer: Self-pay | Admitting: Cardiology

## 2022-07-12 ENCOUNTER — Ambulatory Visit (INDEPENDENT_AMBULATORY_CARE_PROVIDER_SITE_OTHER): Payer: No Typology Code available for payment source | Admitting: Gastroenterology

## 2022-07-12 ENCOUNTER — Telehealth: Payer: Self-pay | Admitting: Cardiology

## 2022-07-12 ENCOUNTER — Other Ambulatory Visit: Payer: Self-pay

## 2022-07-12 VITALS — BP 189/89 | HR 69 | Temp 97.1°F | Ht 63.0 in | Wt 173.6 lb

## 2022-07-12 DIAGNOSIS — E119 Type 2 diabetes mellitus without complications: Secondary | ICD-10-CM | POA: Diagnosis not present

## 2022-07-12 DIAGNOSIS — A048 Other specified bacterial intestinal infections: Secondary | ICD-10-CM | POA: Insufficient documentation

## 2022-07-12 DIAGNOSIS — K219 Gastro-esophageal reflux disease without esophagitis: Secondary | ICD-10-CM

## 2022-07-12 DIAGNOSIS — D649 Anemia, unspecified: Secondary | ICD-10-CM | POA: Diagnosis not present

## 2022-07-12 MED ORDER — VALSARTAN 80 MG PO TABS: 120.0000 mg | ORAL_TABLET | Freq: Every day | ORAL | 2 refills | Status: DC

## 2022-07-12 MED ORDER — VALSARTAN 80 MG PO TABS: 120.0000 mg | ORAL_TABLET | Freq: Two times a day (BID) | ORAL | 2 refills | Status: DC

## 2022-07-12 NOTE — Progress Notes (Signed)
Gastroenterology Office Note     Primary Care Physician:  Redmond School, MD  Primary Gastroenterologist: Dr. Gala Romney    Chief Complaint   Chief Complaint  Patient presents with   Follow-up    Patient here today for a follow up visit. Patient reports Diane Parker is better on omeprazole 40 mg twice daily. Per patient Dr. Gerarda Fraction told her she had H pylori two - three weeks ago. She says she was given treatment, but she did not finish it as she says she was getting over the flu and was too sick to take it.      History of Present Illness   Diane Parker is a 66 y.o. female presenting today in follow-up with a history of adenomas, undergoing surveillance colonoscopy Jan 2023 with multiple adenomas and one 20 mm adenoma removed completely. Surveillance due in 2026. EGD also completed normal other than small hiatal hernia. Normocytic anemia noted but without iron deficiency. She has been asked to stop iron. Ferritin has been elevated but normal sats.    States a few weeks ago had been checked for H.pylori serology. Positive. Treated per PCP. Had the flu and then couldn't finish the treatment. Completed about a week. Taking omeprazole 40 mg BID. GERD controlled. No abdominal pain. No overt GI bleeding. Feeling well now since getting over the flu. No other complaints today.     Past Medical History:  Diagnosis Date   Acute diverticulitis 03/28/2013   Seen on CT - treated with flagyl an cipro   Arthritis    "shoulders, knees, hips" (03/29/2017)   CAD (coronary artery disease), native coronary artery    03/29/17 PCI/DES to the mRCA, EF normal    Essential hypertension    GERD (gastroesophageal reflux disease)    Headache    "bad ones lately; maybe q other day" (03/29/2017)   High cholesterol     Past Surgical History:  Procedure Laterality Date   ABDOMINAL HYSTERECTOMY     COLONOSCOPY  11/08/2007   EQA:[id number], rectosigmoid junction/hyperplastic-appearing polypoid    mucosa, 1 of  these areas biopsied.  Otherwise, normal rectum/Left-sided transverse diverticula/Diminutive polyp just distal to the ileocecal valve. Hyperplastic and adenomatous polyp.    COLONOSCOPY N/A 06/26/2013   GXQ:JJHERDE diverticulosis. Colonic polyps removed   COLONOSCOPY WITH PROPOFOL N/A 09/22/2021   sigmoid and descending colon diverticulosis, two 3-4 mm polyps in ascending colon and cecum, one 20 mm polyp in ascending colon completely removed. Tubular adenomas. 2026 surveillance.   CORONARY ANGIOPLASTY WITH STENT PLACEMENT  03/29/2017   CORONARY STENT INTERVENTION N/A 03/29/2017   Procedure: Coronary Stent Intervention;  Surgeon: Lorretta Harp, MD;  Location: [place] [facility];  Service: Cardiovascular;  Laterality: N/A;   DILATION AND CURETTAGE OF UTERUS     ESOPHAGOGASTRODUODENOSCOPY  2008   RMR: normal   ESOPHAGOGASTRODUODENOSCOPY (EGD) WITH PROPOFOL N/A 09/22/2021   small hiatal hernia, otherwise normal   LEFT HEART CATH AND CORONARY ANGIOGRAPHY N/A 03/29/2017   Procedure: Left Heart Cath and Coronary Angiography;  Surgeon: Lorretta Harp, MD;  Location: [place] [facility];  Service: Cardiovascular;  Laterality: N/A;   POLYPECTOMY  09/22/2021   Procedure: POLYPECTOMY;  Surgeon: Daneil Dolin, MD;  [facility];  Service: Endoscopy;;  cecal polyp, ascending colon polyp hot snare    TUBAL LIGATION      Current Outpatient Medications  Medication Sig Dispense Refill   acetaminophen (TYLENOL) 650 MG CR tablet Take 1,300 mg by mouth every 8 (  eight) hours as needed for pain.     aspirin 81 MG chewable tablet Chew 1 tablet (81 mg total) by mouth daily. 30 tablet 0   b complex vitamins tablet Take 1 tablet by mouth daily.     Baclofen 5 MG TABS Take 5 mg by mouth at bedtime.     carvedilol (COREG) 6.25 MG tablet Take 1 tablet (6.25 mg total) by mouth 2 (two) times daily. 180 tablet 1   cetirizine (ZYRTEC) 10 MG tablet Take 10 mg by mouth daily as needed for allergies.      chlorthalidone (HYGROTON) 25 MG tablet Take 0.5 tablets (12.5 mg total) by mouth daily. (Patient taking differently: Take 25 mg by mouth daily.) 135 tablet 2   Cholecalciferol (VITAMIN D-3) 125 MCG (5000 UT) TABS Take 5,000 Units by mouth daily at 6 (six) AM.     diclofenac sodium (VOLTAREN) 1 % GEL Apply 2 g topically daily as needed (arthritis pain).     diltiazem (CARDIZEM LA) 180 MG 24 hr tablet Take 180 mg by mouth daily. Take 1 Tablet Daily     estradiol (ESTRACE) 2 MG tablet Take 1 tablet (2 mg total) by mouth daily. 90 tablet 4   fluticasone (FLONASE) 50 MCG/ACT nasal spray Place 2 sprays into both nostrils at bedtime as needed for allergies.     hydrOXYzine (ATARAX) 25 MG tablet Take 25 mg by mouth every 8 (eight) hours as needed.     KLOR-CON M20 20 MEQ tablet Take 20 mEq by mouth daily.     meloxicam (MOBIC) 7.5 MG tablet Take 7.5 mg by mouth as needed.     Multiple Vitamin (MULTIVITAMIN WITH MINERALS) TABS tablet Take 1 tablet by mouth daily.     nitroGLYCERIN (NITROSTAT) 0.4 MG SL tablet Place 1 tablet (0.4 mg total) under the tongue every 5 (five) minutes x 3 doses as needed. 25 tablet 3   omeprazole (PRILOSEC) 40 MG capsule Take 40 mg by mouth in the morning and at bedtime.     sertraline (ZOLOFT) 50 MG tablet Take 1 tablet (50 mg total) by mouth daily. (Patient taking differently: Take 25 mg by mouth daily.) 90 tablet 4   traMADol (ULTRAM) 50 MG tablet Take 50 mg by mouth 4 (four) times daily as needed.     No current facility-administered medications for this visit.    Allergies as of 07/12/2022 - Review Complete 07/12/2022  Allergen Reaction Noted   Pollen extract Other (See Comments) 04/22/2021   Tape  04/22/2021   Penicillins Rash 05/30/2019    Family History  Problem Relation Age of Onset   Heart disease Father    Aneurysm Mother    Cancer Cousin        breast   Other Daughter        PTSD   Colon cancer Neg Hx     Social History   Socioeconomic History    Marital status: Single    Spouse name: Not on file   Number of children: Not on file   Years of education: Not on file   Highest education level: Not on file  Occupational History   Not on file  Tobacco Use   Smoking status: Former    Packs/day: 0.12    Years: 3.00    Total pack years: 0.36    Types: Cigarettes    Quit date: 1990    Years since quitting: 33.8   Smokeless tobacco: Never  Vaping Use   Vaping Use:  Never used  Substance and Sexual Activity   Alcohol use: Yes    Alcohol/week: 6.0 standard drinks of alcohol    Types: 6 Cans of beer per week   Drug use: Yes    Types: Marijuana    Comment: "very seldom"   Sexual activity: Yes    Birth control/protection: Surgical    Comment: tubal/hyst  Other Topics Concern   Not on file  Social History Narrative   Not on file   Social Determinants of Health   Financial Resource Strain: Low Risk  (06/06/2022)   Overall Financial Resource Strain (CARDIA)    Difficulty of Paying Living Expenses: Not hard at all  Food Insecurity: No Food Insecurity (06/06/2022)   Hunger Vital Sign    Worried About Running Out of Food in the Last Year: Never true    Ran Out of Food in the Last Year: Never true  Transportation Needs: No Transportation Needs (06/06/2022)   PRAPARE - Hydrologist (Medical): No    Lack of Transportation (Non-Medical): No  Physical Activity: Sufficiently Active (06/06/2022)   Exercise Vital Sign    Days of Exercise per Week: 7 days    Minutes of Exercise per Session: 60 min  Stress: No Stress Concern Present (06/06/2022)   [place]    Feeling of Stress : Only a little  Social Connections: Socially Integrated (06/06/2022)   Social Connection and Isolation Panel [NHANES]    Frequency of Communication with Friends and Family: More than three times a week    Frequency of Social Gatherings with Friends and Family: More than  three times a week    Attends Religious Services: 1 to 4 times per year    Active Member of [employer] or Organizations: Yes    Attends Archivist Meetings: 1 to 4 times per year    Marital Status: Married  Human resources officer Violence: Not At Risk (06/06/2022)   Humiliation, Afraid, Rape, and Kick questionnaire    Fear of Current or Ex-Partner: No    Emotionally Abused: No    Physically Abused: No    Sexually Abused: No     Review of Systems   Gen: Denies any fever, chills, fatigue, weight loss, lack of appetite.  CV: Denies chest pain, heart palpitations, peripheral edema, syncope.  Resp: Denies shortness of breath at rest or with exertion. Denies wheezing or cough.  GI: Denies dysphagia or odynophagia. Denies jaundice, hematemesis, fecal incontinence. GU : Denies urinary burning, urinary frequency, urinary hesitancy MS: Denies joint pain, muscle weakness, cramps, or limitation of movement.  Derm: Denies rash, itching, dry skin Psych: Denies depression, anxiety, memory loss, and confusion Heme: Denies bruising, bleeding, and enlarged lymph nodes.   Physical Exam   BP (!) 189/89 (BP Location: Left Arm, Patient Position: Sitting, Cuff Size: Large)   Pulse 69   Temp (!) 97.1 F (36.2 C) (Temporal)   Ht '5\' 3"'$  (1.6 m)   Wt 173 lb 9.6 oz (78.7 kg)   BMI 30.75 kg/m  General:   Alert and oriented. Pleasant and cooperative. Well-nourished and well-developed.  Head:  Normocephalic and atraumatic. Eyes:  Without icterus Abdomen:  +BS, soft, non-tender and non-distended. No HSM noted. No guarding or rebound. No masses appreciated.  Rectal:  Deferred  Msk:  Symmetrical without gross deformities. Normal posture. Extremities:  Without edema. Neurologic:  Alert and  oriented x4;  grossly normal neurologically. Skin:  Intact without significant  lesions or rashes. Psych:  Alert and cooperative. Normal mood and affect.   Assessment   Diane Parker is a 66 y.o. female presenting  today in follow-up with a history of  adenomas, GERD, and normocytic anemia without IDA. Returning for follow-up today.  Anemia: ferritin actually elevated, normal iron sats. Will recheck now. We asked her to stop iron.  Adenomas: surveillance due in 2026.  History of H.pylori: she notes positive serology test through PCP recently. Attempted to complete course of antibiotics but was only able to complete a week as she got the flu. Will hold PPI x 14 days then check urea breath test.   PLAN    Stop PPI X 14 days then check urea breath test Resume PPI thereafter Check iron studies Return 6 months   Annitta Needs, PhD, [facility]enter[facility]

## 2022-07-12 NOTE — Telephone Encounter (Signed)
Called patient, added Valsartan back to list and sent to pharmacy.   Per last note: start Valsartan 80 mg (1.5 tablet) = 120 mg twice daily.   Sent to local pharmacy as patient is out of medicine.

## 2022-07-12 NOTE — Patient Instructions (Signed)
Stop the omeprazole today. Don't take for 14 days. You can go on 11/22 to [facility] to do the breath test. Do not drink or eat anything after midnight the night before doing this. I also do think we should recheck your iron studies then!  We will see you in 6 months! You can start taking omeprazole again after the breath test!  I enjoyed seeing you again today! As you know, I value our relationship and want to provide genuine, compassionate, and quality care. I welcome your feedback. If you receive a survey regarding your visit,  I greatly appreciate you taking time to fill this out. See you next time!  Annitta Needs, PhD, ANP-BC [facility] Gastroenterology

## 2022-07-12 NOTE — Telephone Encounter (Signed)
Pt c/o medication issue:  1. Name of Medication:   valsartan (DIOVAN) 80 MG tablet   2. How are you currently taking this medication (dosage and times per day)?   As prescribed  3. Are you having a reaction (difficulty breathing--STAT)?   No  4. What is your medication issue?   Patient stated she is completely out of this medication and the last tablet she took was last Friday.  Patient stated she was taking the 160 mg and has not started taking the 80 mg dosage as pre-authorization is still pending.

## 2022-07-26 DIAGNOSIS — D649 Anemia, unspecified: Secondary | ICD-10-CM | POA: Diagnosis not present

## 2022-07-26 DIAGNOSIS — A048 Other specified bacterial intestinal infections: Secondary | ICD-10-CM | POA: Diagnosis not present

## 2022-07-27 LAB — IRON,TIBC AND FERRITIN PANEL
Ferritin: 547 ng/mL — ABNORMAL HIGH (ref 15–150)
Iron Saturation: 35 % (ref 15–55)
Iron: 123 ug/dL (ref 27–139)
Total Iron Binding Capacity: 352 ug/dL (ref 250–450)
UIBC: 229 ug/dL (ref 118–369)

## 2022-07-28 LAB — H. PYLORI BREATH TEST: H pylori Breath Test: NEGATIVE

## 2022-07-31 ENCOUNTER — Telehealth: Payer: Self-pay | Admitting: Cardiology

## 2022-07-31 MED ORDER — VALSARTAN 80 MG PO TABS: 120.0000 mg | ORAL_TABLET | Freq: Two times a day (BID) | ORAL | 3 refills | Status: DC

## 2022-07-31 NOTE — Telephone Encounter (Signed)
[facility] is calling to see if the patient could be called to see how she is taking Valsartan. They state they have been trying to reach patient with no luck and would like a call back to verify how patient is actually taking this medication.

## 2022-07-31 NOTE — Telephone Encounter (Signed)
Spoke to patient she stated she is taking Valsartan 80 mg 1&1/2 tablets twice a day.Refill sent to [facility].

## 2022-09-19 ENCOUNTER — Telehealth: Payer: Self-pay | Admitting: Cardiology

## 2022-09-19 DIAGNOSIS — K2100 Gastro-esophageal reflux disease with esophagitis, without bleeding: Secondary | ICD-10-CM | POA: Diagnosis not present

## 2022-09-19 DIAGNOSIS — K219 Gastro-esophageal reflux disease without esophagitis: Secondary | ICD-10-CM | POA: Diagnosis not present

## 2022-09-19 DIAGNOSIS — I10 Essential (primary) hypertension: Secondary | ICD-10-CM | POA: Diagnosis not present

## 2022-09-19 DIAGNOSIS — I700 Atherosclerosis of aorta: Secondary | ICD-10-CM | POA: Diagnosis not present

## 2022-09-19 DIAGNOSIS — J042 Acute laryngotracheitis: Secondary | ICD-10-CM | POA: Diagnosis not present

## 2022-09-19 DIAGNOSIS — E119 Type 2 diabetes mellitus without complications: Secondary | ICD-10-CM | POA: Diagnosis not present

## 2022-09-19 MED ORDER — VALSARTAN 80 MG PO TABS: 120.0000 mg | ORAL_TABLET | Freq: Two times a day (BID) | ORAL | 0 refills | Status: DC

## 2022-09-19 MED ORDER — VALSARTAN 80 MG PO TABS: 120.0000 mg | ORAL_TABLET | Freq: Two times a day (BID) | ORAL | 3 refills | Status: DC

## 2022-09-19 MED ORDER — CHLORTHALIDONE 25 MG PO TABS: 25.0000 mg | ORAL_TABLET | Freq: Every day | ORAL | 3 refills | Status: DC

## 2022-09-19 NOTE — Telephone Encounter (Signed)
Pt c/o medication issue:  1. Name of Medication:  valsartan (DIOVAN) 80 MG tablet chlorthalidone (HYGROTON) 25 MG tablet (Expired)   2. How are you currently taking this medication (dosage and times per day)?   3. Are you having a reaction (difficulty breathing--STAT)?   4. What is your medication issue? 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

## 2022-09-19 NOTE — Telephone Encounter (Signed)
Called patient left message to call back   Need to clarify what patient is taking daily before medication can be sent to pharmacy

## 2022-09-19 NOTE — Telephone Encounter (Signed)
Spoke to patient  to clarify medications Per patient   Chlorthalidone 25 mg daily   Valsartan 120 mg twice a day  ( equal 80 mg + 1/2 tablet of 80 mg -which is  40 mg ) tablet twice a day    Patient aware medication sent to both pharmacies

## 2022-09-20 ENCOUNTER — Telehealth: Payer: Self-pay | Admitting: Cardiology

## 2022-09-20 ENCOUNTER — Other Ambulatory Visit: Payer: Self-pay

## 2022-09-20 ENCOUNTER — Telehealth: Payer: Self-pay

## 2022-09-20 ENCOUNTER — Telehealth: Payer: Self-pay | Admitting: Internal Medicine

## 2022-09-20 DIAGNOSIS — E785 Hyperlipidemia, unspecified: Secondary | ICD-10-CM

## 2022-09-20 NOTE — Telephone Encounter (Signed)
Contacted patient, she states she has been off her rosuvastatin since her visit in November and now she wants to know what she needs to do. The joint pain has improved now that she is not on this medication.   Advised I would route a message to MD to review.   Patient verbalized understanding

## 2022-09-20 NOTE — Telephone Encounter (Signed)
Diane Parker wants to know should she schedule pt for appt for GERD. The pt had EGD and colonoscopy 6 months ago and you seen here 07/12/2022 and according to the note she is doing good. Didn't want to call the pt to bring in until I see from you what you will have me to do.   I phoned the pt and LMOVM for her to call me if she is having issues.  Pt returned the call and she advised me that she has a sore throat, losing her voice with talking, and her lymph nodes in her neck are swollen. Dr Gerarda Fraction put her on a 7 to 10 pack of Levaquin and carafate. I advised the pt to keep taking the meds and at the end if no better to call us back and we will see what we can do regarding it. Pt agrees to take this route and to call me next week with an update

## 2022-09-20 NOTE — Telephone Encounter (Signed)
Pt c/o medication issue:  1. Name of Medication:   rosuvastatin (CRESTOR) tablet 10 mg     2. How are you currently taking this medication (dosage and times per day)?  Currently not taking  3. Are you having a reaction (difficulty breathing--STAT)? N/A  4. What is your medication issue?   Patient stated Dr. Percival Spanish took her off this medication at her last visit as she was having pain in her joints.  Patient wanted to report since she has been off this medication her pains have subsided a lot.  Patient would like to know what alternate medication she can take for her cholesterol.

## 2022-09-20 NOTE — Telephone Encounter (Signed)
error 

## 2022-09-20 NOTE — Telephone Encounter (Signed)
Called patient, advised of message below.   Patient verbalized understanding.   Referral placed for PHARMD.

## 2022-09-20 NOTE — Telephone Encounter (Signed)
Pt saw Aviva Kluver on 07/12/2022 and has a FU OV for 01/11/2023. Her PCP has sent a referral over for her GERD. Please advise if she needs another OV since she was just seen in [place].Marland [phone]

## 2022-09-21 DIAGNOSIS — K2100 Gastro-esophageal reflux disease with esophagitis, without bleeding: Secondary | ICD-10-CM | POA: Diagnosis not present

## 2022-09-26 NOTE — Telephone Encounter (Signed)
Sounds good. We can move appt up sooner if issues with GERD.

## 2022-09-26 NOTE — Telephone Encounter (Signed)
Please schedule to come in sooner (per Roseanne Kaufman) if she has GERD symptoms

## 2022-09-27 NOTE — Telephone Encounter (Signed)
noted 

## 2022-10-03 ENCOUNTER — Telehealth: Payer: Self-pay | Admitting: Cardiology

## 2022-10-03 ENCOUNTER — Other Ambulatory Visit: Payer: Self-pay

## 2022-10-03 MED ORDER — VALSARTAN 80 MG PO TABS: ORAL_TABLET | ORAL | 1 refills | Status: DC

## 2022-10-03 MED ORDER — VALSARTAN 40 MG PO TABS: ORAL_TABLET | ORAL | 1 refills | Status: DC

## 2022-10-03 MED ORDER — VALSARTAN 40 MG PO TABS: ORAL_TABLET | ORAL | 0 refills | Status: DC

## 2022-10-03 MED ORDER — VALSARTAN 80 MG PO TABS: ORAL_TABLET | ORAL | 0 refills | Status: DC

## 2022-10-03 NOTE — Telephone Encounter (Signed)
Called patient, advised that she would like for Dr.Hochrein to send over another dosage of the tablet, because her insurance will not approve for 1.5 tablet of the 80 mg (120 mg). She is out of medication and needs it sent in today.   Will route to MD to review.  Thanks!

## 2022-10-03 NOTE — Telephone Encounter (Signed)
Called patient back, advised of message below. Sent local RX to pharmacy for 30 day supply, will update prescriptions to her mail order for a 90 day supply.   Thank you!

## 2022-10-03 NOTE — Telephone Encounter (Signed)
Pt c/o medication issue:  1. Name of Medication:   valsartan (DIOVAN) 80 MG tablet   2. How are you currently taking this medication (dosage and times per day)?   As prescribed  3. Are you having a reaction (difficulty breathing--STAT)?   No  4. What is your medication issue?   Patient stated the way the prescription is written her insurance will not cover the prescription because she keeps running out.  Patient stated she would prefer to have the 160 mg tablets.

## 2022-10-05 ENCOUNTER — Telehealth: Payer: Self-pay | Admitting: Cardiology

## 2022-10-05 NOTE — Telephone Encounter (Signed)
*  STAT* If patient is at the pharmacy, call can be transferred to refill team.   1. Which medications need to be refilled? (please list name of each medication and dose if known)   valsartan (DIOVAN) 80 MG tablet    2. Which pharmacy/location (including street and city if local pharmacy) is medication to be sent to?  [place], [place]    3. Do they need a 30 day or 90 day supply?  30 day   Pt is completely out of medication and needs refill sent to local pharmacy until Mail Order gets there.

## 2022-10-06 NOTE — Telephone Encounter (Signed)
Called patient, she reports that she does have enough of her medication, but she just wanted to make sure that [facility] had this RX as well. Told her that RX was sent [facility] for both Valsartan doses= '80mg'$  and '40mg'$ . Told to let us know is she needed anything else. She has a pharmacy appt 10/10/22.

## 2022-10-09 NOTE — Progress Notes (Deleted)
Patient ID: Diane Parker                 DOB: Jul 08, 1956                    MRN: [id number]      HPI: Diane Parker is a 67 y.o. female patient referred to lipid clinic by ***. PMH is significant for hypertension, Unstable angina, CAD s/p percutaneous coronary angioplasty, HDL  Crestor intolerance  Reviewed options for lowering LDL cholesterol, including ezetimibe, PCSK-9 inhibitors, bempedoic acid and inclisiran.  Discussed mechanisms of action, dosing, side effects and potential decreases in LDL cholesterol.  Also reviewed cost information and potential options for patient assistance.  Current Medications:  Intolerances: Crestor 40 mg, atorvastatin 40 mg  Risk Factors: Unstable angina, CAD s/p percutaneous coronary angioplasty LDL goal: <55 Last LDLc : 97 mg/dl on 07/07/22 Diet:   Exercise:   Family History:   Social History:   Labs: Lipid Panel  07/07/2022 LDLc: 97, HDL: 59, TG: 210    Past Medical History:  Diagnosis Date   Acute diverticulitis 03/28/2013   Seen on CT - treated with flagyl an cipro   Arthritis    "shoulders, knees, hips" (03/29/2017)   CAD (coronary artery disease), native coronary artery    03/29/17 PCI/DES to the mRCA, EF normal    Essential hypertension    GERD (gastroesophageal reflux disease)    Headache    "bad ones lately; maybe q other day" (03/29/2017)   High cholesterol     Current Outpatient Medications on File Prior to Visit  Medication Sig Dispense Refill   acetaminophen (TYLENOL) 650 MG CR tablet Take 1,300 mg by mouth every 8 (eight) hours as needed for pain.     aspirin 81 MG chewable tablet Chew 1 tablet (81 mg total) by mouth daily. 30 tablet 0   b complex vitamins tablet Take 1 tablet by mouth daily.     Baclofen 5 MG TABS Take 5 mg by mouth at bedtime.     carvedilol (COREG) 6.25 MG tablet Take 1 tablet (6.25 mg total) by mouth 2 (two) times daily. 180 tablet 1   cetirizine (ZYRTEC) 10 MG tablet Take 10 mg by mouth daily as  needed for allergies.     chlorthalidone (HYGROTON) 25 MG tablet Take 1 tablet (25 mg total) by mouth daily. 180 tablet 3   Cholecalciferol (VITAMIN D-3) 125 MCG (5000 UT) TABS Take 5,000 Units by mouth daily at 6 (six) AM.     diclofenac sodium (VOLTAREN) 1 % GEL Apply 2 g topically daily as needed (arthritis pain).     diltiazem (CARDIZEM LA) 180 MG 24 hr tablet Take 180 mg by mouth daily. Take 1 Tablet Daily     estradiol (ESTRACE) 2 MG tablet Take 1 tablet (2 mg total) by mouth daily. 90 tablet 4   fluticasone (FLONASE) 50 MCG/ACT nasal spray Place 2 sprays into both nostrils at bedtime as needed for allergies.     hydrOXYzine (ATARAX) 25 MG tablet Take 25 mg by mouth every 8 (eight) hours as needed.     KLOR-CON M20 20 MEQ tablet Take 20 mEq by mouth daily.     meloxicam (MOBIC) 7.5 MG tablet Take 7.5 mg by mouth as needed.     Multiple Vitamin (MULTIVITAMIN WITH MINERALS) TABS tablet Take 1 tablet by mouth daily.     nitroGLYCERIN (NITROSTAT) 0.4 MG SL tablet Place 1 tablet (0.4 mg total) under the tongue  every 5 (five) minutes x 3 doses as needed. 25 tablet 3   omeprazole (PRILOSEC) 40 MG capsule Take 40 mg by mouth in the morning and at bedtime.     sertraline (ZOLOFT) 50 MG tablet Take 1 tablet (50 mg total) by mouth daily. (Patient taking differently: Take 25 mg by mouth daily.) 90 tablet 4   traMADol (ULTRAM) 50 MG tablet Take 50 mg by mouth 4 (four) times daily as needed.     valsartan (DIOVAN) 40 MG tablet Take 1 tablet along with 80 mg tablet twice daily = 120 mg 90 tablet 1   valsartan (DIOVAN) 80 MG tablet Take 1 tablet along with 40 mg tablet twice daily = 120 mg 90 tablet 1   No current facility-administered medications on file prior to visit.    Allergies  Allergen Reactions   Pollen Extract Other (See Comments)    Sinus symptoms, headache   Tape     Band aid-redness and brusing   Penicillins Rash    Assessment/Plan:  1. Hyperlipidemia -  No problems updated. No  problem-specific Assessment & Plan notes found for this encounter.    Thank you,  Cammy Copa, Pharm.D [facility] A Division of [facility] [address] [place], [place] [address]  Phone: [phone]; Fax: [phone]

## 2022-10-10 ENCOUNTER — Ambulatory Visit: Payer: 59

## 2022-10-12 ENCOUNTER — Ambulatory Visit: Payer: 59 | Attending: Cardiology | Admitting: Pharmacist

## 2022-10-12 DIAGNOSIS — Z9861 Coronary angioplasty status: Secondary | ICD-10-CM | POA: Diagnosis not present

## 2022-10-12 DIAGNOSIS — I2510 Atherosclerotic heart disease of native coronary artery without angina pectoris: Secondary | ICD-10-CM | POA: Diagnosis not present

## 2022-10-12 DIAGNOSIS — M791 Myalgia, unspecified site: Secondary | ICD-10-CM

## 2022-10-12 DIAGNOSIS — T466X5A Adverse effect of antihyperlipidemic and antiarteriosclerotic drugs, initial encounter: Secondary | ICD-10-CM | POA: Diagnosis not present

## 2022-10-12 DIAGNOSIS — E785 Hyperlipidemia, unspecified: Secondary | ICD-10-CM

## 2022-10-12 NOTE — Progress Notes (Unsigned)
Patient ID: GITA DILGER                 DOB: [birth year]                    MRN: [id number]     HPI: Diane Parker is a 67 y.o. female patient referred to lipid clinic by Dr Percival Spanish. PMH is significant for HTN, angina, CAD, HLD and statin intolerance.  Patient recently completed statin holiday off of Crestor. Reports she feels much better. Joint pain and muscle pain have ceased. 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  Reports she does eat fried food and fatty foods. Drinks alcohol (beers) throughout the week.  Had RCA stent placed in 2018. 99% stenosis.  Current Medications: N/A  Intolerances:  Atorvastatin Rosuvastatin  Risk Factors:  CAD HLD HTN  LDL goal: <70  Labs: TC 192, Trigs 210, HDL 59, LDL 97 (07/07/22)  Past Medical History:  Diagnosis Date   Acute diverticulitis 03/28/2013   Seen on CT - treated with flagyl an cipro   Arthritis    "shoulders, knees, hips" (03/29/2017)   CAD (coronary artery disease), native coronary artery    03/29/17 PCI/DES to the mRCA, EF normal    Essential hypertension    GERD (gastroesophageal reflux disease)    Headache    "bad ones lately; maybe q other day" (03/29/2017)   High cholesterol     Current Outpatient Medications on File Prior to Visit  Medication Sig Dispense Refill   acetaminophen (TYLENOL) 650 MG CR tablet Take 1,300 mg by mouth every 8 (eight) hours as needed for pain.     aspirin 81 MG chewable tablet Chew 1 tablet (81 mg total) by mouth daily. 30 tablet 0   b complex vitamins tablet Take 1 tablet by mouth daily.     Baclofen 5 MG TABS Take 5 mg by mouth at bedtime.     carvedilol (COREG) 6.25 MG tablet Take 1 tablet (6.25 mg total) by mouth 2 (two) times daily. 180 tablet 1   cetirizine (ZYRTEC) 10 MG tablet Take 10 mg by mouth daily as needed for allergies.     chlorthalidone (HYGROTON) 25 MG tablet Take 1 tablet (25 mg total) by mouth daily. 180 tablet  3   Cholecalciferol (VITAMIN D-3) 125 MCG (5000 UT) TABS Take 5,000 Units by mouth daily at 6 (six) AM.     diclofenac sodium (VOLTAREN) 1 % GEL Apply 2 g topically daily as needed (arthritis pain).     diltiazem (CARDIZEM LA) 180 MG 24 hr tablet Take 180 mg by mouth daily. Take 1 Tablet Daily     estradiol (ESTRACE) 2 MG tablet Take 1 tablet (2 mg total) by mouth daily. 90 tablet 4   fluticasone (FLONASE) 50 MCG/ACT nasal spray Place 2 sprays into both nostrils at bedtime as needed for allergies.     hydrOXYzine (ATARAX) 25 MG tablet Take 25 mg by mouth every 8 (eight) hours as needed.     KLOR-CON M20 20 MEQ tablet Take 20 mEq by mouth daily.     meloxicam (MOBIC) 7.5 MG tablet Take 7.5 mg by mouth as needed.     Multiple Vitamin (MULTIVITAMIN WITH MINERALS) TABS tablet Take 1 tablet by mouth daily.     nitroGLYCERIN (NITROSTAT) 0.4 MG SL tablet Place 1 tablet (0.4 mg total) under the tongue every 5 (five) minutes x 3 doses  as needed. 25 tablet 3   omeprazole (PRILOSEC) 40 MG capsule Take 40 mg by mouth in the morning and at bedtime.     sertraline (ZOLOFT) 50 MG tablet Take 1 tablet (50 mg total) by mouth daily. (Patient taking differently: Take 25 mg by mouth daily.) 90 tablet 4   traMADol (ULTRAM) 50 MG tablet Take 50 mg by mouth 4 (four) times daily as needed.     valsartan (DIOVAN) 40 MG tablet Take 1 tablet along with 80 mg tablet twice daily = 120 mg 90 tablet 1   valsartan (DIOVAN) 80 MG tablet Take 1 tablet along with 40 mg tablet twice daily = 120 mg 90 tablet 1   No current facility-administered medications on file prior to visit.    Allergies  Allergen Reactions   Pollen Extract Other (See Comments)    Sinus symptoms, headache   Tape     Band aid-redness and brusing   Penicillins Rash    Assessment/Plan:  1. Hyperlipidemia - Patient LDL 97 which is above goal of <70. Intolerant to statins. Due to CAD, recommend addition of PCSK9i.  Using demo pen, educated patient on  mechansim of action, storage, site selection, administration, and possible adverse effects. Pairent was able to demonstrate in room. Enrolled patient in [employer] while in room. Will complete PA and contact patient when approved. Recheck lipid panel in 2-3 months.  Start Repatha '140mg'$  q 2 weeks Recheck lipid panel in 2-3 months  Karren Cobble, PharmD, [place], [place], [place], [place], [place], [address] Phone: [phone], Fax: [phone]

## 2022-10-12 NOTE — Patient Instructions (Signed)
It was nice meeting you today  We would like your LDL (bad cholesterol) to be less than 70  We would like to start a new medication called Repatha. You would use one injection every 2 weeks  I will complete the prior authorization for you and let you know when it is approved  Once you start the medication, we will check your cholesterol in 2-3 months  Please let us know if you have any questions  Karren Cobble, PharmD, [place], [place], [place], [place], [place], [address] Phone: [phone], Fax: [phone]

## 2022-10-13 ENCOUNTER — Other Ambulatory Visit (HOSPITAL_COMMUNITY): Payer: Self-pay

## 2022-10-13 ENCOUNTER — Telehealth: Payer: Self-pay

## 2022-10-13 DIAGNOSIS — I200 Unstable angina: Secondary | ICD-10-CM

## 2022-10-13 DIAGNOSIS — E785 Hyperlipidemia, unspecified: Secondary | ICD-10-CM

## 2022-10-13 DIAGNOSIS — I2510 Atherosclerotic heart disease of native coronary artery without angina pectoris: Secondary | ICD-10-CM

## 2022-10-13 MED ORDER — REPATHA SURECLICK 140 MG/ML ~~LOC~~ SOAJ: 1.0000 mL | SUBCUTANEOUS | 3 refills | Status: DC

## 2022-10-13 NOTE — Telephone Encounter (Signed)
Pharmacy Patient Advocate Encounter   Received notification from [place]  that prior authorization for REPATHA 140 MG/ML INJ is needed.    PA submitted on 10/13/22 Key [id number] Status is pending  Karie Soda, [place] Patient Advocate Specialist Direct Number: [phone] Fax: [phone]

## 2022-10-13 NOTE — Addendum Note (Signed)
Addended by: Rollen Sox on: 10/13/2022 11:56 AM   Modules accepted: Orders

## 2022-10-13 NOTE — Telephone Encounter (Signed)
Pharmacy Patient Advocate Encounter  Prior Authorization for REPATHA 140 MG/ML INJ  has been approved.    PA# [id number] Effective dates: 10/13/22 through 04/13/23  Karie Soda, [place] Patient Advocate Specialist Direct Number: [phone] Fax: [phone]

## 2022-10-13 NOTE — Telephone Encounter (Signed)
Rx sent to pharmacy. [employer] grant info:  CARD NO. [id number]   CARD STATUS Active   BIN [id number]   PCN [id number]   PC GROUP [id number]

## 2022-10-13 NOTE — Telephone Encounter (Signed)
-----   Message from Rollen Sox, [facility] sent at 10/12/2022  5:36 PM EST ----- Regarding: PA Please complete Repatha PA.  Thank you

## 2022-10-23 ENCOUNTER — Other Ambulatory Visit: Payer: Self-pay

## 2022-10-25 MED ORDER — VALSARTAN 80 MG PO TABS: ORAL_TABLET | ORAL | 1 refills | Status: DC

## 2022-10-25 MED ORDER — VALSARTAN 40 MG PO TABS: ORAL_TABLET | ORAL | 1 refills | Status: DC

## 2022-10-25 NOTE — Telephone Encounter (Signed)
RX was not sent to [facility]- it was sent over to [facility]. We have no been contacted by their office- patient has been taking medication. If any other needs [facility] can contact.  Thanks!

## 2022-10-25 NOTE — Telephone Encounter (Signed)
May be duplicate fill. Thanks!

## 2022-11-02 ENCOUNTER — Encounter: Payer: Self-pay | Admitting: Radiology

## 2022-11-22 ENCOUNTER — Other Ambulatory Visit: Payer: Self-pay | Admitting: Cardiology

## 2022-11-27 ENCOUNTER — Other Ambulatory Visit: Payer: Self-pay | Admitting: *Deleted

## 2022-11-27 MED ORDER — ESTRADIOL 2 MG PO TABS: 2.0000 mg | ORAL_TABLET | Freq: Every day | ORAL | 4 refills | Status: DC

## 2022-11-28 ENCOUNTER — Telehealth: Payer: Self-pay | Admitting: Cardiology

## 2022-11-28 DIAGNOSIS — R002 Palpitations: Secondary | ICD-10-CM

## 2022-11-28 MED ORDER — CHLORTHALIDONE 25 MG PO TABS: 25.0000 mg | ORAL_TABLET | Freq: Every day | ORAL | 0 refills | Status: DC

## 2022-11-28 NOTE — Telephone Encounter (Signed)
Pt reports that she is having palpitations that comes and goes- takes her breath away for a second, but it is not for long.  Pt reports that it may happen one or two times a day- after about 15 seconds it is gone. It will happen a couple times a week or every couple of weeks  Pt reports that she did have a lot of these episodes before she got her BP under control. States that her BP has been doing good lately- 130/80. She reports that she has not taken chlorthalidone in a few days because her pharmacy was out- [facility]- she got it filled today at [facility] and will pick it up.  She reports that she does not feel any shortness of breath, no dizziness, no sweating, does not feel like she is going to pass out when this happens/or after. (Just takes her breath away for a few seconds)   Given ER precautions and told that I would send this information to her provider for recommendations.

## 2022-11-28 NOTE — Telephone Encounter (Signed)
Patient c/o Palpitations:  High priority if patient c/o lightheadedness, shortness of breath, or chest pain  How long have you had palpitations/irregular HR/ Afib? Are you having the symptoms now? Irregular HR, yes for a second but then it went away. Its coming and going but when it comes it take her breath away for a second as well.   Are you currently experiencing lightheadedness, SOB or CP? Not right now, but that also comes and goes.   Do you have a history of afib (atrial fibrillation) or irregular heart rhythm? "I've mentioned it before but they always say they done find nothing"  Have you checked your BP or HR? (document readings if available): "No, because I'm not home"   Are you experiencing any other symptoms? No

## 2022-11-28 NOTE — Telephone Encounter (Signed)
*  STAT* If patient is at the pharmacy, call can be transferred to refill team.   1. Which medications need to be refilled? (please list name of each medication and dose if known) chlorthalidone (HYGROTON) 25 MG tablet   2. Which pharmacy/location (including street and city if local pharmacy) is medication to be sent to? [place], [place]   3. Do they need a 30 day or 90 day supply? 90 day

## 2022-11-28 NOTE — Telephone Encounter (Signed)
90 day refill Chlorthalidone has been sent to [place], [place].

## 2022-12-01 ENCOUNTER — Ambulatory Visit: Payer: 59 | Attending: Cardiology

## 2022-12-01 DIAGNOSIS — R002 Palpitations: Secondary | ICD-10-CM

## 2022-12-01 NOTE — Telephone Encounter (Signed)
Minus Breeding, MD  Sharee Holster, RN Caller: Unspecified (3 days ago,  2:56 PM) I would send her a 2 week Zio and then we can see her after that is resulted.  Thanks.  Called this patient and gave the provider's message. Told her that the monitor will be mailed to her with directions, she needs to wear it for 2 weeks, and she will mail it back. We will make an appointment after results are received.  She verbalized understanding.

## 2022-12-01 NOTE — Progress Notes (Unsigned)
Enrolled patient for a 14 day Zio XT  monitor to be mailed to patients home  °

## 2022-12-05 DIAGNOSIS — R002 Palpitations: Secondary | ICD-10-CM | POA: Diagnosis not present

## 2022-12-27 DIAGNOSIS — R002 Palpitations: Secondary | ICD-10-CM | POA: Diagnosis not present

## 2022-12-29 ENCOUNTER — Other Ambulatory Visit (HOSPITAL_COMMUNITY): Payer: Self-pay | Admitting: Internal Medicine

## 2022-12-29 DIAGNOSIS — M67912 Unspecified disorder of synovium and tendon, left shoulder: Secondary | ICD-10-CM | POA: Diagnosis not present

## 2022-12-29 DIAGNOSIS — I10 Essential (primary) hypertension: Secondary | ICD-10-CM | POA: Diagnosis not present

## 2022-12-29 DIAGNOSIS — K219 Gastro-esophageal reflux disease without esophagitis: Secondary | ICD-10-CM | POA: Diagnosis not present

## 2022-12-29 DIAGNOSIS — I700 Atherosclerosis of aorta: Secondary | ICD-10-CM | POA: Diagnosis not present

## 2022-12-29 DIAGNOSIS — M25512 Pain in left shoulder: Secondary | ICD-10-CM

## 2023-01-02 ENCOUNTER — Ambulatory Visit (HOSPITAL_COMMUNITY)
Admission: RE | Admit: 2023-01-02 | Discharge: 2023-01-02 | Disposition: A | Discharge status: Home / Self Care | Payer: 59 | Source: Ambulatory Visit | Attending: Internal Medicine | Admitting: Internal Medicine

## 2023-01-02 DIAGNOSIS — M19012 Primary osteoarthritis, left shoulder: Secondary | ICD-10-CM | POA: Diagnosis not present

## 2023-01-02 DIAGNOSIS — M25512 Pain in left shoulder: Secondary | ICD-10-CM | POA: Diagnosis not present

## 2023-01-03 ENCOUNTER — Telehealth: Payer: Self-pay | Admitting: Cardiology

## 2023-01-03 NOTE — Telephone Encounter (Signed)
Pt is requesting to see if Juanda Crumble PA can send pt in to get a breast exam. Pt states she hasn't had one in a while.

## 2023-01-03 NOTE — Telephone Encounter (Signed)
Called patient, advised that this testing and exam should come from primary care office.   Patient verbalized understanding, she will reach out to them to get scheduled.

## 2023-01-07 NOTE — Progress Notes (Deleted)
Cardiology Office Note:   Date:  01/07/2023  ID:  Diane Parker, DOB 04/01/1956, MRN [id number]  History of Present Illness:   Diane Parker is a 67 y.o. female who presents for who was seen by Dr. Purvis [place] with a history of CAD (RCA stent placement for mid RCA 99% stenosis 03/29/2017, also stenosis of 90% and ostium of first diagonal branch), HTN, HLD, palpitations.  When I saw her in 2022 she had chest pain.  Lexiscan Myoview was low risk.    Since I last saw her ***   ***  she has been seen by our clinic APPs.   She had some low BP and had her chlorthalidone was reduced.  She says that she is having aches all over.  She describes it as arthritis.  She says its in her joints and her muscles.  She says this is slowly getting worse.  She had some swelling last week.  She said she took extra chlorthalidone.  Of note the last time she saw she actually had to have her chlorthalidone reduced because her blood pressures were dropping.  She said her swelling did go back down to baseline.  She was also using a lot of salt she thought.  She cut this down.  She has noticed her blood pressure is going up to the 160 to 170 range consistently.  She was getting better blood pressure control.  She denies any new palpitations, presyncope or syncope.  She has had no new chest pressure, neck or arm discomfort.  ROS: ***  Studies Reviewed:    EKG:  ***  ***  Risk Assessment/Calculations:   {Does this patient have ATRIAL FIBRILLATION?:[phone]} No BP recorded.  {Refresh Note OR Click here to enter BP  :1}***        Physical Exam:   VS:  There were no vitals taken for this visit.   Wt Readings from Last 3 Encounters:  07/12/22 173 lb 9.6 oz (78.7 kg)  07/05/22 175 lb 6.4 oz (79.6 kg)  06/06/22 175 lb 8 oz (79.6 kg)     GEN: Well nourished, well developed in no acute distress NECK: No JVD; No carotid bruits CARDIAC: ***RRR, no murmurs, rubs, gallops RESPIRATORY:  Clear to auscultation without  rales, wheezing or rhonchi  ABDOMEN: Soft, non-tender, non-distended EXTREMITIES:  No edema; No deformity   ASSESSMENT AND PLAN:   Coronary artery disease, stable:  ***The patient has no new sypmtoms.  No further cardiovascular testing is indicated.  We will continue with aggressive risk reduction and meds as listed.  She needs continued secondary risk reduction.   Palpitations:  ***  , improved: She has had no new symptoms.   Hypertension : ***  Her blood pressure is not controlled.  I Minna increase her valsartan 120 mg twice daily.   Muscle aches:  ***  To see if this is related to the Crestor and I will have her hold her Crestor for 4 weeks.  If there is no improvement she can go back on the Crestor.  I have actually asked her to rechallenge herself with Crestor if her aches and pains happen to go away.  I want to make sure that the statin is causally related before discontinuing it.  If we do discontinue it she would probably need PCSK9    Hyperlipidemia:  ***  As above.      {Are you ordering a CV Procedure (e.g. stress test, cath, DCCV, TEE, etc)?   Press F2        :  [id number]   Signed, Minus Breeding, MD

## 2023-01-09 ENCOUNTER — Ambulatory Visit: Payer: 59 | Admitting: Cardiology

## 2023-01-09 DIAGNOSIS — I10 Essential (primary) hypertension: Secondary | ICD-10-CM

## 2023-01-09 DIAGNOSIS — I2510 Atherosclerotic heart disease of native coronary artery without angina pectoris: Secondary | ICD-10-CM

## 2023-01-09 DIAGNOSIS — E785 Hyperlipidemia, unspecified: Secondary | ICD-10-CM

## 2023-01-09 DIAGNOSIS — R002 Palpitations: Secondary | ICD-10-CM

## 2023-01-11 ENCOUNTER — Ambulatory Visit: Payer: No Typology Code available for payment source | Admitting: Gastroenterology

## 2023-01-15 ENCOUNTER — Encounter: Payer: Self-pay | Admitting: Orthopedic Surgery

## 2023-01-15 ENCOUNTER — Ambulatory Visit (INDEPENDENT_AMBULATORY_CARE_PROVIDER_SITE_OTHER): Payer: 59 | Admitting: Orthopedic Surgery

## 2023-01-15 VITALS — BP 182/93 | HR 77 | Ht 63.0 in | Wt 172.0 lb

## 2023-01-15 DIAGNOSIS — M19012 Primary osteoarthritis, left shoulder: Secondary | ICD-10-CM | POA: Diagnosis not present

## 2023-01-15 MED ORDER — METHYLPREDNISOLONE ACETATE 40 MG/ML IJ SUSP
40.0000 mg | Freq: Once | INTRAMUSCULAR | Status: AC
  Administered 2023-01-15: 40 mg via INTRA_ARTICULAR

## 2023-01-15 NOTE — Patient Instructions (Signed)
Physical therapy has been ordered for you at . They should call you to schedule, [phone] is the phone number to call, if you want to call to schedule.   

## 2023-01-15 NOTE — Progress Notes (Signed)
   Chief Complaint  Patient presents with   Shoulder Pain    Left     HPI: 67 year old female last seen 2021 for hip pain comes in today with a 4-year history of left shoulder pain no injury  She did say that she pulled her shoulder back in 1994 had therapy and recovered  She has trouble lifting her arm she has pain at night it wakes her up she describes a dull ache  She has not had any physical therapy no injections no medication although she takes 1-2 tramadol and a BC powder as needed for shoulder pain  She was on Mobic but does not take it as prescribed    Past Medical History:  Diagnosis Date   Acute diverticulitis 03/28/2013   Seen on CT - treated with flagyl an cipro   Arthritis    "shoulders, knees, hips" (03/29/2017)   CAD (coronary artery disease), native coronary artery    03/29/17 PCI/DES to the mRCA, EF normal    Essential hypertension    GERD (gastroesophageal reflux disease)    Headache    "bad ones lately; maybe q other day" (03/29/2017)   High cholesterol     BP (!) 182/93   Pulse 77   Ht 5\' 3"  (1.6 m)   Wt 172 lb (78 kg)   BMI 30.47 kg/m    General appearance: Well-developed well-nourished no gross deformities  Cardiovascular normal pulse and perfusion normal color without edema  Neurologically no sensation loss or deficits or pathologic reflexes  Psychological: Awake alert and oriented x3 mood and affect normal  Skin no lacerations or ulcerations no nodularity no palpable masses, no erythema or nodularity  Musculoskeletal:   Shoulder is tender posteriorly laterally and anteriorly Active range of motion only 45 degrees abduction passive external rotation with arm at her side 30 degrees  Painful range of motion at terminal rotation externally  Rotator cuff could not be assessed fully because of the range of motion limitation  Imaging shoulder x-ray shows osteoarthritis of the joint with a bone spur and some narrowing the x-ray was done at an  outside facility so this is my personal interpretation of the imaging  A/P  This problem is chronic for years in nature likely to last greater than a year and worsening symptom was Osteoarthritis left shoulder  Encounter Diagnosis  Name Primary?   Glenohumeral arthritis, left Yes     She is so stiff right now we really cannot make a real assessment of what is going on differential diagnosis includes OA cuff tear bursitis tendinitis  Procedure note the subacromial injection shoulder left   Verbal consent was obtained to inject the  Left   Shoulder  Timeout was completed to confirm the injection site is a subacromial space of the  left  shoulder  Medication used Depo-Medrol 40 mg and lidocaine 1% 3 cc  Anesthesia was provided by ethyl chloride  The injection was performed in the left  posterior subacromial space. After pinning the skin with alcohol and anesthetized the skin with ethyl chloride the subacromial space was injected using a 20-gauge needle. There were no complications  Sterile dressing was applied.

## 2023-01-17 ENCOUNTER — Ambulatory Visit: Payer: 59 | Admitting: Cardiology

## 2023-01-22 ENCOUNTER — Ambulatory Visit (HOSPITAL_COMMUNITY): Payer: 59 | Admitting: Occupational Therapy

## 2023-02-06 ENCOUNTER — Ambulatory Visit (HOSPITAL_COMMUNITY): Payer: 59 | Admitting: Occupational Therapy

## 2023-02-12 ENCOUNTER — Ambulatory Visit: Payer: 59 | Admitting: Orthopedic Surgery

## 2023-02-15 ENCOUNTER — Encounter (HOSPITAL_COMMUNITY): Payer: 59 | Admitting: Occupational Therapy

## 2023-02-25 NOTE — Progress Notes (Unsigned)
  Cardiology Office Note:   Date:  02/27/2023  ID:  Diane Parker, DOB Oct 06, 1955, MRN [id number] PCP: Elfredia Nevins, MD  [facility] Providers Cardiologist:  Rollene Rotunda, MD {  History of Present Illness:   Diane Parker is a 67 y.o. female who presents for who was seen by Dr. Purvis Sheffield with a history of CAD (RCA stent placement for mid RCA 99% stenosis 03/29/2017, also stenosis of 90% and ostium of first diagonal branch), HTN, HLD, palpitations.  When I saw her in 2022 she had chest pain.  Lexiscan Myoview was low risk.     Since I last saw her she has had continued palpitations.  She wore a monitor that demonstrated runs of SVT.  She feels these mostly at night.  She is not having any new presyncope or syncope.  She is not having any new chest pressure, neck or arm.  She has had no weight gain or edema.  She tries to do some yard work and is going to start walking with friends.    ROS: Positive for cough.   Studies Reviewed:    EKG:   Sinus rhythm, rate 68, axis within normal limits, intervals within normal limits, no acute ST-T wave changes.  Risk Assessment/Calculations:          Physical Exam:   VS:  BP (!) 144/72   Pulse 68   Ht 5\' 3"  (1.6 m)   Wt 172 lb 6.4 oz (78.2 kg)   SpO2 97%   BMI 30.54 kg/m    Wt Readings from Last 3 Encounters:  02/27/23 172 lb 6.4 oz (78.2 kg)  01/15/23 172 lb (78 kg)  07/12/22 173 lb 9.6 oz (78.7 kg)     GEN: Well nourished, well developed in no acute distress NECK: No JVD; No carotid bruits CARDIAC: RRR, no murmurs, rubs, gallops RESPIRATORY:  Clear to auscultation without rales, wheezing or rhonchi  ABDOMEN: Soft, non-tender, non-distended EXTREMITIES:  No edema; No deformity   ASSESSMENT AND PLAN:   Coronary artery disease:  The patient has no new sypmtoms.  No further cardiovascular testing is indicated.  We will continue with aggressive risk reduction and meds as listed.  Palpitations; I am going to increase her  carvedilol to 12.5 mg twice daily.  She has brief runs of SVT.   Hypertension : Her blood pressure is elevated.  I will increase the carvedilol as above.    Hyperlipidemia: She did not tolerate statins.  She is on PCSK9 I am going to have her come back for fasting lipid profile.  Goal LDL will be in the 50s.         Follow up me in 12 months.    Signed, Rollene Rotunda, MD

## 2023-02-27 ENCOUNTER — Other Ambulatory Visit: Payer: Self-pay

## 2023-02-27 ENCOUNTER — Encounter: Payer: Self-pay | Admitting: Cardiology

## 2023-02-27 ENCOUNTER — Other Ambulatory Visit: Payer: Self-pay | Admitting: *Deleted

## 2023-02-27 ENCOUNTER — Ambulatory Visit: Payer: 59 | Attending: Cardiology | Admitting: Cardiology

## 2023-02-27 VITALS — BP 144/72 | HR 68 | Ht 63.0 in | Wt 172.4 lb

## 2023-02-27 DIAGNOSIS — R002 Palpitations: Secondary | ICD-10-CM

## 2023-02-27 DIAGNOSIS — I2510 Atherosclerotic heart disease of native coronary artery without angina pectoris: Secondary | ICD-10-CM

## 2023-02-27 DIAGNOSIS — I10 Essential (primary) hypertension: Secondary | ICD-10-CM | POA: Diagnosis not present

## 2023-02-27 DIAGNOSIS — E785 Hyperlipidemia, unspecified: Secondary | ICD-10-CM | POA: Diagnosis not present

## 2023-02-27 DIAGNOSIS — I200 Unstable angina: Secondary | ICD-10-CM

## 2023-02-27 MED ORDER — CARVEDILOL 12.5 MG PO TABS: 12.5000 mg | ORAL_TABLET | Freq: Two times a day (BID) | ORAL | 3 refills | Status: DC

## 2023-02-27 NOTE — Patient Instructions (Signed)
Medication Instructions:   INCREASE CARVEDILOL TO 12.5 MG TWICE DAILY= 2 OF THE 6.25 MG TABLETS TWICE DAILY  *If you need a refill on your cardiac medications before your next appointment, please call your pharmacy*   Lab Work:  Your physician recommends that you return for lab work FASTING  If you have labs (blood work) drawn today and your tests are completely normal, you will receive your results only by: MyChart Message (if you have MyChart) OR A paper copy in the mail If you have any lab test that is abnormal or we need to change your treatment, we will call you to review the results.   Follow-Up: At [facility], you and your health needs are our priority.  As part of our continuing mission to provide you with exceptional heart care, we have created designated Provider Care Teams.  These Care Teams include your primary Cardiologist (physician) and Advanced Practice Providers (APPs -  Physician Assistants and Nurse Practitioners) who all work together to provide you with the care you need, when you need it.  We recommend signing up for the patient portal called "MyChart".  Sign up information is provided on this After Visit Summary.  MyChart is used to connect with patients for Virtual Visits (Telemedicine).  Patients are able to view lab/test results, encounter notes, upcoming appointments, etc.  Non-urgent messages can be sent to your provider as well.   To learn more about what you can do with MyChart, go to [email or web address].    Your next appointment:   12 month(s)  Provider:   Rollene Rotunda, MD

## 2023-03-01 ENCOUNTER — Ambulatory Visit: Payer: 59 | Admitting: Orthopedic Surgery

## 2023-03-19 DIAGNOSIS — E785 Hyperlipidemia, unspecified: Secondary | ICD-10-CM | POA: Diagnosis not present

## 2023-03-19 DIAGNOSIS — I2510 Atherosclerotic heart disease of native coronary artery without angina pectoris: Secondary | ICD-10-CM | POA: Diagnosis not present

## 2023-03-19 DIAGNOSIS — Z9861 Coronary angioplasty status: Secondary | ICD-10-CM | POA: Diagnosis not present

## 2023-03-19 DIAGNOSIS — I200 Unstable angina: Secondary | ICD-10-CM | POA: Diagnosis not present

## 2023-03-20 ENCOUNTER — Ambulatory Visit (INDEPENDENT_AMBULATORY_CARE_PROVIDER_SITE_OTHER): Payer: 59 | Admitting: Adult Health

## 2023-03-20 ENCOUNTER — Encounter: Payer: Self-pay | Admitting: Adult Health

## 2023-03-20 VITALS — BP 162/84 | HR 69 | Ht 63.0 in | Wt 168.5 lb

## 2023-03-20 DIAGNOSIS — R1032 Left lower quadrant pain: Secondary | ICD-10-CM

## 2023-03-20 DIAGNOSIS — Z90710 Acquired absence of both cervix and uterus: Secondary | ICD-10-CM | POA: Diagnosis not present

## 2023-03-20 LAB — LIPID PANEL
Chol/HDL Ratio: 2 ratio (ref 0.0–4.4)
Cholesterol, Total: 148 mg/dL (ref 100–199)
HDL: 74 mg/dL (ref >39)
LDL Chol Calc (NIH): 42 mg/dL (ref 0–99)
Triglycerides: 206 mg/dL — ABNORMAL HIGH (ref 0–149)
VLDL Cholesterol Cal: 32 mg/dL (ref 5–40)

## 2023-03-20 NOTE — Progress Notes (Signed)
  Subjective:     Patient ID: Diane Parker, female   DOB: 1956/05/01, 67 y.o.   MRN: [id number]  HPI Diane Parker is a 67 year old black female, single, sp hysterectomy in complaining of LLQ pain on and off, like when trying to have a period.  PCP is Dr Sherwood Gambler.  Review of Systems LLQ pain on and off. Reviewed past medical,surgical, social and family history. Reviewed medications and allergies.     Objective:   Physical Exam BP (!) 162/84 (BP Location: Right Arm, Patient Position: Sitting, Cuff Size: Normal)   Pulse 69   Ht 5\' 3"  (1.6 m)   Wt 168 lb 8 oz (76.4 kg)   BMI 29.85 kg/m     Skin warm and dry.Pelvic: external genitalia is normal in appearance no lesions, vagina: pink, scant white discharge, no odor,urethra has no lesions or masses noted, cervix and uterus are absent, adnexa: no masses, +LLQ tenderness noted. Bladder is non tender and no masses felt.  Fall risk is low  Upstream - 03/20/23 1154       Pregnancy Intention Screening   Does the patient want to become pregnant in the next year? N/A    Does the patient's partner want to become pregnant in the next year? N/A    Would the patient like to discuss contraceptive options today? N/A      Contraception Wrap Up   Current Method Female Sterilization   hyst   End Method Female Sterilization   hyst   Contraception Counseling Provided No            Examination chaperoned by Malachy Mood LPN  Assessment:     1. LLQ pain LLQ pain on and off, like when trying to start a period Pelvic US scheduled for [facility] 04/02/23 at 2:30 pm to evaluate ovaries  - US PELVIC COMPLETE WITH TRANSVAGINAL; Future  2. S/P hysterectomy - US PELVIC COMPLETE WITH TRANSVAGINAL; Future     Plan:    Will talk when results back  Follow up TBD

## 2023-03-23 ENCOUNTER — Encounter: Payer: Self-pay | Admitting: *Deleted

## 2023-04-02 ENCOUNTER — Other Ambulatory Visit (HOSPITAL_COMMUNITY): Payer: Self-pay | Admitting: Internal Medicine

## 2023-04-02 ENCOUNTER — Ambulatory Visit (HOSPITAL_COMMUNITY)
Admission: RE | Admit: 2023-04-02 | Discharge: 2023-04-02 | Disposition: A | Discharge status: Home / Self Care | Payer: 59 | Source: Ambulatory Visit | Attending: Adult Health | Admitting: Adult Health

## 2023-04-02 DIAGNOSIS — Z1231 Encounter for screening mammogram for malignant neoplasm of breast: Secondary | ICD-10-CM

## 2023-04-02 DIAGNOSIS — Z90710 Acquired absence of both cervix and uterus: Secondary | ICD-10-CM | POA: Insufficient documentation

## 2023-04-02 DIAGNOSIS — R1032 Left lower quadrant pain: Secondary | ICD-10-CM | POA: Diagnosis not present

## 2023-04-03 ENCOUNTER — Encounter: Payer: Self-pay | Admitting: Gastroenterology

## 2023-04-03 ENCOUNTER — Ambulatory Visit (INDEPENDENT_AMBULATORY_CARE_PROVIDER_SITE_OTHER): Payer: 59 | Admitting: Gastroenterology

## 2023-04-03 VITALS — BP 175/81 | HR 59 | Temp 98.3°F | Ht 63.0 in | Wt 172.0 lb

## 2023-04-03 DIAGNOSIS — R110 Nausea: Secondary | ICD-10-CM | POA: Diagnosis not present

## 2023-04-03 DIAGNOSIS — K219 Gastro-esophageal reflux disease without esophagitis: Secondary | ICD-10-CM | POA: Diagnosis not present

## 2023-04-03 NOTE — Progress Notes (Signed)
Gastroenterology Office Note     Primary Care Physician:  Elfredia Nevins, MD  Primary Gastroenterologist: Dr. Jena Gauss    Chief Complaint   Chief Complaint  Patient presents with   Nausea    Having nausea every morning since beginning of June. Had some vomiting one day about 2 weeks ago. Was also having chest pain that day and took nitroglycerin. Takes omeprazole bid.      History of Present Illness   Diane Parker is a 67 y.o. female presenting today with a history of adenomas, undergoing surveillance colonoscopy Jan 2023 with multiple adenomas and one 20 mm adenoma removed completely. Surveillance due in 2026. Chronic GERD.   Last seen in Nov 2023 and had reported positive H.pylori serologies through PCP s/p incomplete treatment. We checked urea breath test, and this was negative.   Now returning with nausea each morning since early June. Vomiting a few times. Had LLQ pelvic pain and had pelvic US yesterday. Saw GYN. Feels nausea in upper abdomen. Has to go straight to taking omeprazole 40 mg that morning. Taking BID. Was eating ice cream and cake at night but has slacked off. Will eat about 730 or 8pm but goes to bed 1130 or 12am. Omeprazole relieves the nausea in the mornings. Takes omeprazole on empty stomach in am. Will take evening dose on empty stomach as well. Good appetite. Lemon water has helped. One cup of coffee a day. Eating fried/fatty foods. Has taken pantoprazole in the past without improvement. Had been on meloxicam in the past but now on Voltaren pill and gel. Noted association with symptoms while on Voltaren. Did not have issues with meloxicam. Wants to stay on omeprazole.   Last saw cardiology February 27, 2023. Has chronic chest pain.       Jan 2023 EGD: normal other than small hiatal hernia. Normocytic anemia noted but without iron deficiency    Jan 2023 colonoscopy with multiple adenomas and one 20 mm adenoma removed completely. Surveillance due in 2026.    Past Medical History:  Diagnosis Date   Acute diverticulitis 03/28/2013   Seen on CT - treated with flagyl an cipro   Arthritis    "shoulders, knees, hips" (03/29/2017)   CAD (coronary artery disease), native coronary artery    03/29/17 PCI/DES to the mRCA, EF normal    Essential hypertension    GERD (gastroesophageal reflux disease)    Headache    "bad ones lately; maybe q other day" (03/29/2017)   High cholesterol     Past Surgical History:  Procedure Laterality Date   ABDOMINAL HYSTERECTOMY     COLONOSCOPY  11/08/2007   WUX:[id number], rectosigmoid junction/hyperplastic-appearing polypoid    mucosa, 1 of these areas biopsied.  Otherwise, normal rectum/Left-sided transverse diverticula/Diminutive polyp just distal to the ileocecal valve. Hyperplastic and adenomatous polyp.    COLONOSCOPY N/A 06/26/2013   GUY:[id number] diverticulosis. Colonic polyps removed   COLONOSCOPY WITH PROPOFOL N/A 09/22/2021   sigmoid and descending colon diverticulosis, two 3-4 mm polyps in ascending colon and cecum, one 20 mm polyp in ascending colon completely removed. Tubular adenomas. 2026 surveillance.   CORONARY ANGIOPLASTY WITH STENT PLACEMENT  03/29/2017   CORONARY STENT INTERVENTION N/A 03/29/2017   Procedure: Coronary Stent Intervention;  Surgeon: Runell Gess, MD;  Location: [facility] INVASIVE CV LAB;  Service: Cardiovascular;  Laterality: N/A;   DILATION AND CURETTAGE OF UTERUS     ESOPHAGOGASTRODUODENOSCOPY  2008   RMR: normal   ESOPHAGOGASTRODUODENOSCOPY (EGD) WITH PROPOFOL N/A 09/22/2021  small hiatal hernia, otherwise normal   LEFT HEART CATH AND CORONARY ANGIOGRAPHY N/A 03/29/2017   Procedure: Left Heart Cath and Coronary Angiography;  Surgeon: Runell Gess, MD;  Location: [facility] INVASIVE CV LAB;  Service: Cardiovascular;  Laterality: N/A;   POLYPECTOMY  09/22/2021   Procedure: POLYPECTOMY;  Surgeon: Corbin Ade, MD;  Location: [facility] ENDO SUITE;  Service: Endoscopy;;  cecal polyp,  ascending colon polyp hot snare    TUBAL LIGATION      Current Outpatient Medications  Medication Sig Dispense Refill   acetaminophen (TYLENOL) 650 MG CR tablet Take 1,300 mg by mouth every 8 (eight) hours as needed for pain.     aspirin 81 MG chewable tablet Chew 1 tablet (81 mg total) by mouth daily. 30 tablet 0   b complex vitamins tablet Take 1 tablet by mouth daily.     Baclofen 5 MG TABS Take 5 mg by mouth at bedtime.     carvedilol (COREG) 12.5 MG tablet Take 1 tablet (12.5 mg total) by mouth 2 (two) times daily. 180 tablet 3   cetirizine (ZYRTEC) 10 MG tablet Take 10 mg by mouth daily as needed for allergies.     chlorthalidone (HYGROTON) 25 MG tablet Take 1 tablet (25 mg total) by mouth daily. 180 tablet 0   Cholecalciferol (VITAMIN D-3) 125 MCG (5000 UT) TABS Take 5,000 Units by mouth daily at 6 (six) AM.     diclofenac sodium (VOLTAREN) 1 % GEL Apply 2 g topically daily as needed (arthritis pain).     diltiazem (CARDIZEM LA) 180 MG 24 hr tablet Take 180 mg by mouth daily. Take 1 Tablet Daily     estradiol (ESTRACE) 2 MG tablet Take 1 tablet (2 mg total) by mouth daily. 90 tablet 4   Evolocumab (REPATHA SURECLICK) 140 MG/ML SOAJ Inject 140 mg into the skin every 14 (fourteen) days. 6 mL 3   fluticasone (FLONASE) 50 MCG/ACT nasal spray Place 2 sprays into both nostrils at bedtime as needed for allergies.     hydrOXYzine (ATARAX) 25 MG tablet Take 25 mg by mouth every 8 (eight) hours as needed.     KLOR-CON M20 20 MEQ tablet Take 20 mEq by mouth daily.     meloxicam (MOBIC) 7.5 MG tablet Take 7.5 mg by mouth as needed.     Multiple Vitamin (MULTIVITAMIN WITH MINERALS) TABS tablet Take 1 tablet by mouth daily.     nitroGLYCERIN (NITROSTAT) 0.4 MG SL tablet Place 1 tablet (0.4 mg total) under the tongue every 5 (five) minutes x 3 doses as needed. 25 tablet 3   omeprazole (PRILOSEC) 40 MG capsule Take 40 mg by mouth in the morning and at bedtime.     OVER THE COUNTER MEDICATION Pre and  probiotic daily     sertraline (ZOLOFT) 50 MG tablet Take 1 tablet (50 mg total) by mouth daily. (Patient taking differently: Take 25 mg by mouth daily.) 90 tablet 4   traMADol (ULTRAM) 50 MG tablet Take 50 mg by mouth 4 (four) times daily as needed.     valsartan (DIOVAN) 40 MG tablet TAKE 1 TABLET BY MOUTH TWICE  DAILY ALONG WITH 80 MG TABLET  FOR TOTAL 120 MG 180 tablet 3   valsartan (DIOVAN) 80 MG tablet TAKE 1 TABLET BY MOUTH TWICE  DAILY ALONG WITH 40 MG TABLET  FOR TOTAL 120 MG 180 tablet 3   No current facility-administered medications for this visit.    Allergies as of 04/03/2023 - Review  Complete 04/03/2023  Allergen Reaction Noted   Pollen extract Other (See Comments) 04/22/2021   Rosuvastatin Other (See Comments) 10/12/2022   Tape  04/22/2021   Penicillins Rash 05/30/2019    Family History  Problem Relation Age of Onset   Heart disease Father    Aneurysm Mother    Cancer Cousin        breast   Other Daughter        PTSD   Colon cancer Neg Hx     Social History   Socioeconomic History   Marital status: Single    Spouse name: Not on file   Number of children: Not on file   Years of education: Not on file   Highest education level: Not on file  Occupational History   Not on file  Tobacco Use   Smoking status: Former    Current packs/day: 0.00    Average packs/day: 0.1 packs/day for 3.0 years (0.4 ttl pk-yrs)    Types: Cigarettes    Start date: 69    Quit date: 70    Years since quitting: 34.6    Passive exposure: Current   Smokeless tobacco: Never  Vaping Use   Vaping status: Never Used  Substance and Sexual Activity   Alcohol use: Yes    Alcohol/week: 6.0 standard drinks of alcohol    Types: 6 Cans of beer per week   Drug use: Yes    Types: Marijuana    Comment: "very seldom"   Sexual activity: Not Currently    Birth control/protection: Surgical    Comment: tubal/hyst  Other Topics Concern   Not on file  Social History Narrative   Not on  file   Social Determinants of Health   Financial Resource Strain: Low Risk  (06/06/2022)   Overall Financial Resource Strain (CARDIA)    Difficulty of Paying Living Expenses: Not hard at all  Food Insecurity: No Food Insecurity (06/06/2022)   Hunger Vital Sign    Worried About Running Out of Food in the Last Year: Never true    Ran Out of Food in the Last Year: Never true  Transportation Needs: No Transportation Needs (06/06/2022)   PRAPARE - Administrator, Civil Service (Medical): No    Lack of Transportation (Non-Medical): No  Physical Activity: Sufficiently Active (06/06/2022)   Exercise Vital Sign    Days of Exercise per Week: 7 days    Minutes of Exercise per Session: 60 min  Stress: No Stress Concern Present (06/06/2022)   [employer] of Occupational Health - Occupational Stress Questionnaire    Feeling of Stress : Only a little  Social Connections: Socially Integrated (06/06/2022)   Social Connection and Isolation Panel [NHANES]    Frequency of Communication with Friends and Family: More than three times a week    Frequency of Social Gatherings with Friends and Family: More than three times a week    Attends Religious Services: 1 to 4 times per year    Active Member of [employer] or Organizations: Yes    Attends Banker Meetings: 1 to 4 times per year    Marital Status: Married  Catering manager Violence: Not At Risk (06/06/2022)   Humiliation, Afraid, Rape, and Kick questionnaire    Fear of Current or Ex-Partner: No    Emotionally Abused: No    Physically Abused: No    Sexually Abused: No     Review of Systems   Gen: Denies any fever, chills, fatigue, weight loss,  lack of appetite.  CV: Denies chest pain, heart palpitations, peripheral edema, syncope.  Resp: Denies shortness of breath at rest or with exertion. Denies wheezing or cough.  GI: Denies dysphagia or odynophagia. Denies jaundice, hematemesis, fecal incontinence. GU : Denies urinary  burning, urinary frequency, urinary hesitancy MS: Denies joint pain, muscle weakness, cramps, or limitation of movement.  Derm: Denies rash, itching, dry skin Psych: Denies depression, anxiety, memory loss, and confusion Heme: Denies bruising, bleeding, and enlarged lymph nodes.   Physical Exam   BP (!) 175/81   Pulse (!) 59   Temp 98.3 F (36.8 C) (Oral)   Ht 5\' 3"  (1.6 m)   Wt 172 lb (78 kg)   BMI 30.47 kg/m  General:   Alert and oriented. Pleasant and cooperative. Well-nourished and well-developed.  Head:  Normocephalic and atraumatic. Eyes:  Without icterus Abdomen:  +BS, soft, non-tender and non-distended. No HSM noted. No guarding or rebound. No masses appreciated.  Rectal:  Deferred  Msk:  Symmetrical without gross deformities. Normal posture. Extremities:  Without edema. Neurologic:  Alert and  oriented x4;  grossly normal neurologically. Skin:  Intact without significant lesions or rashes. Psych:  Alert and cooperative. Normal mood and affect.   Assessment   67 year old female presenting today with a history of adenomas, undergoing surveillance colonoscopy Jan 2023 with multiple adenomas and one 20 mm adenoma removed completely. Surveillance due in 2026. Chronic GERD.   Now with nausea in setting of diclofenac, relieved with omeprazole each morning. Endorses fried/fatty foods as well. She feels omeprazole helps typical GERD symptoms overall and declining change in PPI therapy. EGD on file recently. No alarm signs/symptoms. Suspect multifactorial in setting of NSAIDs and diet.   Will continue with PPI BID, add Pepcid. Stop diclofenac. GERD diet discussed.   PLAN    Continue omeprazole BID Add Pepcid in evenings GERD diet Stop diclofenac Return in 3 months Colonoscopy 2026   Gelene Mink, PhD, [facility]astroenter[facility]

## 2023-04-03 NOTE — Patient Instructions (Signed)
Continue omeprazole twice a day, making sure you take on an empty stomach before meals.  You can add Pepcid at bedtime (this is over the counter).  I would avoid diclofenac.   I have attached a diet sheet that will help with reflux control.  Call if no improvement or worsening!  We will see you in 3 months!  I enjoyed seeing you again today! I value our relationship and want to provide genuine, compassionate, and quality care. You may receive a survey regarding your visit with me, and I welcome your feedback! Thanks so much for taking the time to complete this. I look forward to seeing you again.      Gelene Mink, PhD, ANP-BC [facility] Gastroenterology

## 2023-04-09 ENCOUNTER — Encounter (HOSPITAL_COMMUNITY): Payer: Self-pay

## 2023-04-09 ENCOUNTER — Ambulatory Visit (HOSPITAL_COMMUNITY)
Admission: RE | Admit: 2023-04-09 | Discharge: 2023-04-09 | Disposition: A | Discharge status: Home / Self Care | Payer: 59 | Source: Ambulatory Visit | Attending: Internal Medicine | Admitting: Internal Medicine

## 2023-04-09 DIAGNOSIS — Z1231 Encounter for screening mammogram for malignant neoplasm of breast: Secondary | ICD-10-CM | POA: Diagnosis not present

## 2023-04-11 ENCOUNTER — Other Ambulatory Visit (HOSPITAL_COMMUNITY): Payer: Self-pay | Admitting: Internal Medicine

## 2023-04-11 DIAGNOSIS — R928 Other abnormal and inconclusive findings on diagnostic imaging of breast: Secondary | ICD-10-CM

## 2023-04-19 ENCOUNTER — Telehealth: Payer: Self-pay

## 2023-04-19 ENCOUNTER — Other Ambulatory Visit (HOSPITAL_COMMUNITY): Payer: Self-pay

## 2023-04-19 NOTE — Telephone Encounter (Signed)
Pharmacy Patient Advocate Encounter   Received notification from CoverMyMeds that prior authorization for REPATHA is required/requested.   Insurance verification completed.   The patient is insured through [facility] .   Per test claim: PA required; PA submitted to [facility] via CoverMyMeds Key/confirmation #/EOC [id number]    Status is pending

## 2023-04-20 ENCOUNTER — Telehealth: Payer: Self-pay | Admitting: Cardiology

## 2023-04-20 MED ORDER — NITROGLYCERIN 0.4 MG SL SUBL: 0.4000 mg | SUBLINGUAL_TABLET | SUBLINGUAL | 11 refills | Status: DC | PRN

## 2023-04-20 NOTE — Telephone Encounter (Signed)
Pt's medication was sent to pt's pharmacy as requested. Confirmation received.  °

## 2023-04-20 NOTE — Telephone Encounter (Signed)
*  STAT* If patient is at the pharmacy, call can be transferred to refill team.   1. Which medications need to be refilled? (please list name of each medication and dose if known)   nitroGLYCERIN (NITROSTAT) 0.4 MG SL tablet     2. Would you like to learn more about the convenience, safety, & potential cost savings by using the [facility]? No  3. Are you open to using the [facility] (Type [facility].)No   4. Which pharmacy/location (including street and city if local pharmacy) is medication to be sent to?  [facility] [address], [place] - [address]     5. Do they need a 30 day or 90 day supply? 30 day   Pt is out of medication

## 2023-04-24 ENCOUNTER — Other Ambulatory Visit (HOSPITAL_COMMUNITY): Payer: Self-pay

## 2023-04-24 NOTE — Telephone Encounter (Signed)
Pharmacy Patient Advocate Encounter  Received notification from [facility] that Prior Authorization for REPATHA has been APPROVED from through 12.31.24    I have ran a test claim and the medication is currently ''too soon to be filled, and the refill is payable on/after 06/25/23''

## 2023-05-01 ENCOUNTER — Ambulatory Visit (HOSPITAL_COMMUNITY)
Admission: RE | Admit: 2023-05-01 | Discharge: 2023-05-01 | Disposition: A | Discharge status: Home / Self Care | Payer: 59 | Source: Ambulatory Visit | Attending: Internal Medicine | Admitting: Internal Medicine

## 2023-05-01 ENCOUNTER — Encounter (HOSPITAL_COMMUNITY): Payer: Self-pay

## 2023-05-01 DIAGNOSIS — R928 Other abnormal and inconclusive findings on diagnostic imaging of breast: Secondary | ICD-10-CM | POA: Diagnosis not present

## 2023-05-01 DIAGNOSIS — N6012 Diffuse cystic mastopathy of left breast: Secondary | ICD-10-CM | POA: Diagnosis not present

## 2023-05-01 DIAGNOSIS — N6002 Solitary cyst of left breast: Secondary | ICD-10-CM | POA: Diagnosis not present

## 2023-05-01 DIAGNOSIS — R92333 Mammographic heterogeneous density, bilateral breasts: Secondary | ICD-10-CM | POA: Diagnosis not present

## 2023-06-19 DIAGNOSIS — M5030 Other cervical disc degeneration, unspecified cervical region: Secondary | ICD-10-CM | POA: Diagnosis not present

## 2023-06-19 DIAGNOSIS — I10 Essential (primary) hypertension: Secondary | ICD-10-CM | POA: Diagnosis not present

## 2023-06-19 DIAGNOSIS — M1991 Primary osteoarthritis, unspecified site: Secondary | ICD-10-CM | POA: Diagnosis not present

## 2023-06-19 DIAGNOSIS — G894 Chronic pain syndrome: Secondary | ICD-10-CM | POA: Diagnosis not present

## 2023-06-19 DIAGNOSIS — I700 Atherosclerosis of aorta: Secondary | ICD-10-CM | POA: Diagnosis not present

## 2023-06-19 DIAGNOSIS — K2100 Gastro-esophageal reflux disease with esophagitis, without bleeding: Secondary | ICD-10-CM | POA: Diagnosis not present

## 2023-06-19 DIAGNOSIS — M67912 Unspecified disorder of synovium and tendon, left shoulder: Secondary | ICD-10-CM | POA: Diagnosis not present

## 2023-06-19 DIAGNOSIS — Z0001 Encounter for general adult medical examination with abnormal findings: Secondary | ICD-10-CM | POA: Diagnosis not present

## 2023-06-19 DIAGNOSIS — K219 Gastro-esophageal reflux disease without esophagitis: Secondary | ICD-10-CM | POA: Diagnosis not present

## 2023-06-22 DIAGNOSIS — R7309 Other abnormal glucose: Secondary | ICD-10-CM | POA: Diagnosis not present

## 2023-06-22 DIAGNOSIS — Z0001 Encounter for general adult medical examination with abnormal findings: Secondary | ICD-10-CM | POA: Diagnosis not present

## 2023-06-22 DIAGNOSIS — I10 Essential (primary) hypertension: Secondary | ICD-10-CM | POA: Diagnosis not present

## 2023-06-22 DIAGNOSIS — E119 Type 2 diabetes mellitus without complications: Secondary | ICD-10-CM | POA: Diagnosis not present

## 2023-06-22 DIAGNOSIS — G9332 Myalgic encephalomyelitis/chronic fatigue syndrome: Secondary | ICD-10-CM | POA: Diagnosis not present

## 2023-06-22 DIAGNOSIS — D518 Other vitamin B12 deficiency anemias: Secondary | ICD-10-CM | POA: Diagnosis not present

## 2023-06-22 DIAGNOSIS — E559 Vitamin D deficiency, unspecified: Secondary | ICD-10-CM | POA: Diagnosis not present

## 2023-06-27 DIAGNOSIS — E042 Nontoxic multinodular goiter: Secondary | ICD-10-CM | POA: Insufficient documentation

## 2023-06-29 ENCOUNTER — Ambulatory Visit: Payer: 59 | Admitting: Orthopedic Surgery

## 2023-07-02 ENCOUNTER — Ambulatory Visit (INDEPENDENT_AMBULATORY_CARE_PROVIDER_SITE_OTHER): Payer: 59 | Admitting: Orthopedic Surgery

## 2023-07-02 ENCOUNTER — Other Ambulatory Visit (INDEPENDENT_AMBULATORY_CARE_PROVIDER_SITE_OTHER): Payer: 59

## 2023-07-02 ENCOUNTER — Encounter: Payer: Self-pay | Admitting: Orthopedic Surgery

## 2023-07-02 VITALS — BP 198/83 | HR 62 | Ht 63.0 in | Wt 170.0 lb

## 2023-07-02 DIAGNOSIS — M25552 Pain in left hip: Secondary | ICD-10-CM

## 2023-07-02 DIAGNOSIS — M1610 Unilateral primary osteoarthritis, unspecified hip: Secondary | ICD-10-CM

## 2023-07-02 DIAGNOSIS — M1612 Unilateral primary osteoarthritis, left hip: Secondary | ICD-10-CM | POA: Diagnosis not present

## 2023-07-02 MED ORDER — METHYLPREDNISOLONE ACETATE 40 MG/ML IJ SUSP
40.0000 mg | Freq: Once | INTRAMUSCULAR | Status: AC
Start: 2023-07-02 — End: 2023-07-02
  Administered 2023-07-02: 40 mg via INTRA_ARTICULAR

## 2023-07-02 NOTE — Progress Notes (Signed)
Office Visit Note   Patient: Diane Parker           Date of Birth: 1956/03/16           MRN: [id number] Visit Date: 07/02/2023 Requested by: Elfredia Nevins, MD [address] [place],  [place] [address] PCP: Elfredia Nevins, MD   Assessment & Plan:   Encounter Diagnoses  Name Primary?   Pain in left hip Yes   Hip arthritis-left     No orders of the defined types were placed in this encounter.  67 year old female coronary artery disease 1 stent currently stable heart disease with osteoarthritis of the left hip she has had the intra-articular injection she has had the physical therapy her hip is worn out.  We offered her the same treatment options and she opted for an IM injection in the left hip  IM injection left hip  40 mg of Depo-Medrol 2 cc 1% lidocaine injected left hip via IM technique this was done with patient consent and verbal timeout   Subjective: Chief Complaint  Patient presents with   Hip Pain    Left / long duration has seen Dr Romeo Apple before for this, getting worse.     HPI: Recurrent pain left hip status post intra-articular injection physical therapy and anti-inflammatory medication.  She appears to be having heart palpitations from the Celebrex              ROS: Back pain   Images personally read and my interpretation : OA left hip severe  Visit Diagnoses:  1. Pain in left hip   2. Hip arthritis-left      Follow-Up Instructions: Return in about 1 year (around 07/01/2024) for FOLLOW UP, LEFT, HIP.    Objective: Vital Signs: BP (!) 198/83   Pulse 62   Ht 5\' 3"  (1.6 m)   Wt 170 lb (77.1 kg)   BMI 30.11 kg/m   Physical Exam Vitals and nursing note reviewed.  Constitutional:      Appearance: Normal appearance.  HENT:     Head: Normocephalic and atraumatic.  Eyes:     General: No scleral icterus.       Right eye: No discharge.        Left eye: No discharge.     Extraocular Movements: Extraocular movements intact.      Conjunctiva/sclera: Conjunctivae normal.     Pupils: Pupils are equal, round, and reactive to light.  Cardiovascular:     Rate and Rhythm: Normal rate.     Pulses: Normal pulses.  Skin:    General: Skin is warm and dry.     Capillary Refill: Capillary refill takes less than 2 seconds.  Neurological:     General: No focal deficit present.     Mental Status: She is alert and oriented to person, place, and time.     Sensory: No sensory deficit.     Motor: No weakness.     Gait: Gait abnormal.  Psychiatric:        Mood and Affect: Mood normal.        Behavior: Behavior normal.        Thought Content: Thought content normal.        Judgment: Judgment normal.      Right Hip Exam  Right hip exam is normal.   Tenderness  The patient is experiencing no tenderness.   Range of Motion  The patient has normal right hip ROM.  Muscle Strength  The patient has normal  right hip strength.  Tests  FABER: negative  Other  Erythema: absent Sensation: normal Pulse: present  Comments:  HIP STABILITY NORMAL    Left Hip Exam   Tenderness  The patient is experiencing no tenderness.   Range of Motion  Abduction:  abnormal  Adduction:  abnormal  Extension:  abnormal  Flexion:  abnormal  External rotation:  abnormal  Internal rotation: abnormal   Muscle Strength  The patient has normal left hip strength.   Tests  FABER: negative  Other  Erythema: absent Sensation: normal Pulse: present  Comments:  HIP STABILITY NORMAL        Specialty Comments:  No specialty comments available.  Imaging: DG HIP UNILAT WITH PELVIS 2-3 VIEWS LEFT  Result Date: 07/02/2023 AP, lateral and AP pelvis X-ray shows osteoarthritis of the left hip X-ray done for left hip and groin pain radiating to the left knee Femur is type a Narrowing is severe there is cyst on both sides the joint there is a osteophyte on the femoral head and the lateral acetabulum and they abut each other Impression  osteoarthritis left hip severe this is near bone-on-bone     PMFS History: Patient Active Problem List   Diagnosis Date Noted   Multinodular goiter 06/27/2023   Myalgia due to statin 10/12/2022   H. pylori infection 07/12/2022   Anxiety 06/06/2022   Bloating 01/05/2022   Anemia 09/09/2021   Rectal bleeding 09/09/2021   Black stools 09/09/2021   Abdominal pain 09/09/2021   Breast discharge 04/22/2021   Hot flashes 04/22/2021   Current use of estrogen therapy 04/22/2021   Palpitations 11/18/2020   Encounter for screening fecal occult blood testing 04/21/2020   S/P hysterectomy 04/21/2020   Encounter for well woman exam with routine gynecological exam 04/21/2020   CAD S/P percutaneous coronary angioplasty 03/30/2017   Essential hypertension 03/30/2017   Hyperlipidemia with target low density lipoprotein (LDL) cholesterol less than 70 mg/dL 47/42/5956   Chest pain due to myocardial ischemia 03/30/2017   Unstable angina pectoris (HCC) 03/29/2017   GERD (gastroesophageal reflux disease) 06/12/2013   Adenomatous polyp 06/12/2013   Acute diverticulitis 03/28/2013   LLQ pain 03/26/2013   History of diverticulosis 03/26/2013   Past Medical History:  Diagnosis Date   Acute diverticulitis 03/28/2013   Seen on CT - treated with flagyl an cipro   Arthritis    "shoulders, knees, hips" (03/29/2017)   CAD (coronary artery disease), native coronary artery    03/29/17 PCI/DES to the mRCA, EF normal    Essential hypertension    GERD (gastroesophageal reflux disease)    Headache    "bad ones lately; maybe q other day" (03/29/2017)   High cholesterol     Family History  Problem Relation Age of Onset   Heart disease Father    Aneurysm Mother    Cancer Cousin        breast   Other Daughter        PTSD   Colon cancer Neg Hx     Past Surgical History:  Procedure Laterality Date   ABDOMINAL HYSTERECTOMY     BREAST BIOPSY Left 06/2021   Fibrocystic changes with calcifications    COLONOSCOPY  11/08/2007   LOV:[id number], rectosigmoid junction/hyperplastic-appearing polypoid    mucosa, 1 of these areas biopsied.  Otherwise, normal rectum/Left-sided transverse diverticula/Diminutive polyp just distal to the ileocecal valve. Hyperplastic and adenomatous polyp.    COLONOSCOPY N/A 06/26/2013   ZYS:[id number] diverticulosis. Colonic polyps removed   COLONOSCOPY WITH PROPOFOL  N/A 09/22/2021   sigmoid and descending colon diverticulosis, two 3-4 mm polyps in ascending colon and cecum, one 20 mm polyp in ascending colon completely removed. Tubular adenomas. 2026 surveillance.   CORONARY ANGIOPLASTY WITH STENT PLACEMENT  03/29/2017   CORONARY STENT INTERVENTION N/A 03/29/2017   Procedure: Coronary Stent Intervention;  Surgeon: Runell Gess, MD;  Location: [facility] INVASIVE CV LAB;  Service: Cardiovascular;  Laterality: N/A;   DILATION AND CURETTAGE OF UTERUS     ESOPHAGOGASTRODUODENOSCOPY  2008   RMR: normal   ESOPHAGOGASTRODUODENOSCOPY (EGD) WITH PROPOFOL N/A 09/22/2021   small hiatal hernia, otherwise normal   LEFT HEART CATH AND CORONARY ANGIOGRAPHY N/A 03/29/2017   Procedure: Left Heart Cath and Coronary Angiography;  Surgeon: Runell Gess, MD;  Location: [facility] INVASIVE CV LAB;  Service: Cardiovascular;  Laterality: N/A;   POLYPECTOMY  09/22/2021   Procedure: POLYPECTOMY;  Surgeon: Corbin Ade, MD;  Location: [facility]O SU[facility];  Service: Endoscopy;;  cecal polyp, ascending colon polyp hot snare    TUBAL LIGATION     Social History   Occupational History   Not on file  Tobacco Use   Smoking status: Former    Current packs/day: 0.00    Average packs/day: 0.1 packs/day for 3.0 years (0.4 ttl pk-yrs)    Types: Cigarettes    Start date: 63    Quit date: 64    Years since quitting: 34.8    Passive exposure: Current   Smokeless tobacco: Never  Vaping Use   Vaping status: Never Used  Substance and Sexual Activity   Alcohol use: Yes    Alcohol/week: 6.0 standard  drinks of alcohol    Types: 6 Cans of beer per week   Drug use: Yes    Types: Marijuana    Comment: "very seldom"   Sexual activity: Not Currently    Birth control/protection: Surgical    Comment: tubal/hyst

## 2023-07-04 ENCOUNTER — Ambulatory Visit: Payer: 59 | Admitting: Gastroenterology

## 2023-07-04 ENCOUNTER — Encounter: Payer: Self-pay | Admitting: Gastroenterology

## 2023-07-04 VITALS — BP 170/77 | HR 63 | Temp 98.5°F | Ht 63.0 in | Wt 169.7 lb

## 2023-07-04 DIAGNOSIS — R110 Nausea: Secondary | ICD-10-CM | POA: Diagnosis not present

## 2023-07-04 DIAGNOSIS — K219 Gastro-esophageal reflux disease without esophagitis: Secondary | ICD-10-CM

## 2023-07-04 DIAGNOSIS — D649 Anemia, unspecified: Secondary | ICD-10-CM

## 2023-07-04 DIAGNOSIS — Z860101 Personal history of adenomatous and serrated colon polyps: Secondary | ICD-10-CM

## 2023-07-04 LAB — IRON,TIBC AND FERRITIN PANEL
%SAT: 21 % (ref 16–45)
Ferritin: 235 ng/mL (ref 16–288)
Iron: 82 ug/dL (ref 45–160)
TIBC: 396 ug/dL (ref 250–450)

## 2023-07-04 NOTE — Patient Instructions (Addendum)
Continue omeprazole each morning 30 minutes before eating.   If needing extra control in evening, you can take famotidine over-the-counter (Pepcid 20 mg).   Please have iron studies done.   We will see you in 6 months!  I enjoyed seeing you again today! I value our relationship and want to provide genuine, compassionate, and quality care. You may receive a survey regarding your visit with me, and I welcome your feedback! Thanks so much for taking the time to complete this. I look forward to seeing you again.      Gelene Mink, PhD, ANP-BC [facility]

## 2023-07-04 NOTE — Progress Notes (Signed)
Gastroenterology Office Note     Primary Care Physician:  Elfredia Nevins, MD  Primary Gastroenterologist: Dr. Jena Gauss    Chief Complaint   Chief Complaint  Patient presents with   Gastroesophageal Reflux    Follow up on GERD and nausea. Has nausea most mornings. Last about 15 minutes. Takes omeprazole 40mg  one every morning and sometimes a second one at night.      History of Present Illness   Diane Parker is a 67 y.o. female presenting today with a history of adenomas, undergoing surveillance colonoscopy Jan 2023 with multiple adenomas and one 20 mm adenoma removed completely. Surveillance due in 2026. Chronic GERD. Last seen July 2024 with nausea  Omeprazole daily. Will only take in evenings also if needed. Good appetite. Nausea in evenings at times. Notices that when BP is high, she has nausea. No abdominal pain.   Recent labs Oct 2024 through [facility]: Hgb 11.1, Hct 34.3, Cr 1.15, GFR 52, Alk Phos 65, AST 24, ALT 20, B12 948, Folate greater than 20. No recent iron studies.   Jan 2023 EGD: normal other than small hiatal hernia. Normocytic anemia noted but without iron deficiency      Jan 2023 colonoscopy with multiple adenomas and one 20 mm adenoma removed completely. Surveillance due in 2026.    Past Medical History:  Diagnosis Date   Acute diverticulitis 03/28/2013   Seen on CT - treated with flagyl an cipro   Arthritis    "shoulders, knees, hips" (03/29/2017)   CAD (coronary artery disease), native coronary artery    03/29/17 PCI/DES to the mRCA, EF normal    Essential hypertension    GERD (gastroesophageal reflux disease)    Headache    "bad ones lately; maybe q other day" (03/29/2017)   High cholesterol     Past Surgical History:  Procedure Laterality Date   ABDOMINAL HYSTERECTOMY     BREAST BIOPSY Left 06/2021   Fibrocystic changes with calcifications   COLONOSCOPY  11/08/2007   ZOX:[id number], rectosigmoid junction/hyperplastic-appearing polypoid     mucosa, 1 of these areas biopsied.  Otherwise, normal rectum/Left-sided transverse diverticula/Diminutive polyp just distal to the ileocecal valve. Hyperplastic and adenomatous polyp.    COLONOSCOPY N/A 06/26/2013   BJY:[id number] diverticulosis. Colonic polyps removed   COLONOSCOPY WITH PROPOFOL N/A 09/22/2021   sigmoid and descending colon diverticulosis, two 3-4 mm polyps in ascending colon and cecum, one 20 mm polyp in ascending colon completely removed. Tubular adenomas. 2026 surveillance.   CORONARY ANGIOPLASTY WITH STENT PLACEMENT  03/29/2017   CORONARY STENT INTERVENTION N/A 03/29/2017   Procedure: Coronary Stent Intervention;  Surgeon: Runell Gess, MD;  Location: [facility] INVASIVE CV LAB;  Service: Cardiovascular;  Laterality: N/A;   DILATION AND CURETTAGE OF UTERUS     ESOPHAGOGASTRODUODENOSCOPY  2008   RMR: normal   ESOPHAGOGASTRODUODENOSCOPY (EGD) WITH PROPOFOL N/A 09/22/2021   small hiatal hernia, otherwise normal   LEFT HEART CATH AND CORONARY ANGIOGRAPHY N/A 03/29/2017   Procedure: Left Heart Cath and Coronary Angiography;  Surgeon: Runell Gess, MD;  Location: [facility] INVASIVE CV LAB;  Service: Cardiovascular;  Laterality: N/A;   POLYPECTOMY  09/22/2021   Procedure: POLYPECTOMY;  Surgeon: Corbin Ade, MD;  Location: [facility];  Service: Endoscopy;;  cecal polyp, ascending colon polyp hot snare    TUBAL LIGATION      Current Outpatient Medications  Medication Sig Dispense Refill   acetaminophen (TYLENOL) 650 MG CR tablet Take 1,300 mg by mouth every 8 (eight)  hours as needed for pain.     aspirin 81 MG chewable tablet Chew 1 tablet (81 mg total) by mouth daily. 30 tablet 0   b complex vitamins tablet Take 1 tablet by mouth daily.     Baclofen 5 MG TABS Take 5 mg by mouth at bedtime.     carvedilol (COREG) 12.5 MG tablet Take 1 tablet (12.5 mg total) by mouth 2 (two) times daily. 180 tablet 3   cetirizine (ZYRTEC) 10 MG tablet Take 10 mg by mouth daily as needed for  allergies.     chlorthalidone (HYGROTON) 25 MG tablet Take 1 tablet (25 mg total) by mouth daily. 180 tablet 0   Cholecalciferol (VITAMIN D-3) 125 MCG (5000 UT) TABS Take 5,000 Units by mouth daily at 6 (six) AM.     diclofenac sodium (VOLTAREN) 1 % GEL Apply 2 g topically daily as needed (arthritis pain).     diltiazem (CARDIZEM LA) 180 MG 24 hr tablet Take 180 mg by mouth daily. Take 1 Tablet Daily     estradiol (ESTRACE) 2 MG tablet Take 1 tablet (2 mg total) by mouth daily. 90 tablet 4   Evolocumab (REPATHA SURECLICK) 140 MG/ML SOAJ Inject 140 mg into the skin every 14 (fourteen) days. 6 mL 3   fluticasone (FLONASE) 50 MCG/ACT nasal spray Place 2 sprays into both nostrils at bedtime as needed for allergies.     hydrOXYzine (ATARAX) 25 MG tablet Take 25 mg by mouth every 8 (eight) hours as needed.     KLOR-CON M20 20 MEQ tablet Take 20 mEq by mouth daily.     meloxicam (MOBIC) 7.5 MG tablet Take 7.5 mg by mouth as needed.     Multiple Vitamin (MULTIVITAMIN WITH MINERALS) TABS tablet Take 1 tablet by mouth daily.     nitroGLYCERIN (NITROSTAT) 0.4 MG SL tablet Place 1 tablet (0.4 mg total) under the tongue every 5 (five) minutes x 3 doses as needed. 25 tablet 11   Omega-3 Fatty Acids (FISH OIL PO) Take by mouth. 2 at night.     omeprazole (PRILOSEC) 40 MG capsule Take 40 mg by mouth in the morning and at bedtime.     OVER THE COUNTER MEDICATION Pre and probiotic daily     OVER THE COUNTER MEDICATION OTC supplement for brain     sertraline (ZOLOFT) 50 MG tablet Take 1 tablet (50 mg total) by mouth daily. (Patient taking differently: Take 25 mg by mouth daily.) 90 tablet 4   sucralfate (CARAFATE) 1 g tablet Take 1 g by mouth. Takes as needed     traMADol (ULTRAM) 50 MG tablet Take 50 mg by mouth 4 (four) times daily as needed.     valsartan (DIOVAN) 40 MG tablet TAKE 1 TABLET BY MOUTH TWICE  DAILY ALONG WITH 80 MG TABLET  FOR TOTAL 120 MG 180 tablet 3   valsartan (DIOVAN) 80 MG tablet TAKE 1  TABLET BY MOUTH TWICE  DAILY ALONG WITH 40 MG TABLET  FOR TOTAL 120 MG 180 tablet 3   celecoxib (CELEBREX) 100 MG capsule Take 100 mg by mouth 2 (two) times daily. (Patient not taking: Reported on 07/04/2023)     ezetimibe-simvastatin (VYTORIN) 10-80 MG tablet TAKE 1 Tablet BY MOUTH ONCE DAILY Oral for 90 (Patient not taking: Reported on 07/04/2023)     No current facility-administered medications for this visit.    Allergies as of 07/04/2023 - Review Complete 07/02/2023  Allergen Reaction Noted   Pollen extract Other (See Comments)  04/22/2021   Rosuvastatin Other (See Comments) 10/12/2022   Tape  04/22/2021   Penicillins Rash 05/30/2019    Family History  Problem Relation Age of Onset   Heart disease Father    Aneurysm Mother    Cancer Cousin        breast   Other Daughter        PTSD   Colon cancer Neg Hx     Social History   Socioeconomic History   Marital status: Single    Spouse name: Not on file   Number of children: Not on file   Years of education: Not on file   Highest education level: Not on file  Occupational History   Not on file  Tobacco Use   Smoking status: Former    Current packs/day: 0.00    Average packs/day: 0.1 packs/day for 3.0 years (0.4 ttl pk-yrs)    Types: Cigarettes    Start date: 89    Quit date: 50    Years since quitting: 34.8    Passive exposure: Current   Smokeless tobacco: Never  Vaping Use   Vaping status: Never Used  Substance and Sexual Activity   Alcohol use: Yes    Alcohol/week: 6.0 standard drinks of alcohol    Types: 6 Cans of beer per week   Drug use: Yes    Types: Marijuana    Comment: "very seldom"   Sexual activity: Not Currently    Birth control/protection: Surgical    Comment: tubal/hyst  Other Topics Concern   Not on file  Social History Narrative   Not on file   Social Determinants of Health   Financial Resource Strain: Low Risk  (06/06/2022)   Overall Financial Resource Strain (CARDIA)    Difficulty  of Paying Living Expenses: Not hard at all  Food Insecurity: No Food Insecurity (06/06/2022)   Hunger Vital Sign    Worried About Running Out of Food in the Last Year: Never true    Ran Out of Food in the Last Year: Never true  Transportation Needs: No Transportation Needs (06/06/2022)   PRAPARE - Administrator, Civil Service (Medical): No    Lack of Transportation (Non-Medical): No  Physical Activity: Sufficiently Active (06/06/2022)   Exercise Vital Sign    Days of Exercise per Week: 7 days    Minutes of Exercise per Session: 60 min  Stress: No Stress Concern Present (06/06/2022)   [employer] of Occupational Health - Occupational Stress Questionnaire    Feeling of Stress : Only a little  Social Connections: Socially Integrated (06/06/2022)   Social Connection and Isolation Panel [NHANES]    Frequency of Communication with Friends and Family: More than three times a week    Frequency of Social Gatherings with Friends and Family: More than three times a week    Attends Religious Services: 1 to 4 times per year    Active Member of [employer] or Organizations: Yes    Attends Banker Meetings: 1 to 4 times per year    Marital Status: Married  Catering manager Violence: Not At Risk (06/06/2022)   Humiliation, Afraid, Rape, and Kick questionnaire    Fear of Current or Ex-Partner: No    Emotionally Abused: No    Physically Abused: No    Sexually Abused: No     Review of Systems   Gen: Denies any fever, chills, fatigue, weight loss, lack of appetite.  CV: Denies chest pain, heart palpitations, peripheral edema, syncope.  Resp: Denies shortness of breath at rest or with exertion. Denies wheezing or cough.  GI: Denies dysphagia or odynophagia. Denies jaundice, hematemesis, fecal incontinence. GU : Denies urinary burning, urinary frequency, urinary hesitancy MS: Denies joint pain, muscle weakness, cramps, or limitation of movement.  Derm: Denies rash, itching, dry  skin Psych: Denies depression, anxiety, memory loss, and confusion Heme: Denies bruising, bleeding, and enlarged lymph nodes.   Physical Exam   BP (!) 170/77   Pulse 63   Temp 98.5 F (36.9 C) (Oral)   Ht 5\' 3"  (1.6 m)   Wt 169 lb 11.2 oz (77 kg)   BMI 30.06 kg/m  General:   Alert and oriented. Pleasant and cooperative. Well-nourished and well-developed.  Head:  Normocephalic and atraumatic. Eyes:  Without icterus Abdomen:  +BS, soft, non-tender and non-distended. No HSM noted. No guarding or rebound. No masses appreciated.  Rectal:  Deferred  Msk:  Symmetrical without gross deformities. Normal posture. Extremities:  Without edema. Neurologic:  Alert and  oriented x4;  grossly normal neurologically. Skin:  Intact without significant lesions or rashes. Psych:  Alert and cooperative. Normal mood and affect.   Assessment   Diane Parker is a 67 y.o. female presenting today with a history of adenomas, undergoing surveillance colonoscopy Jan 2023 with multiple adenomas and one 20 mm adenoma removed completely. Surveillance due in 2026. Chronic GERD. Returning today for follow-up.  GERD managed primarily with omeprazole just once daily. Rarely will take in evening as well. She does endorse sporadic nausea but only when she has BP spikes. Labs reviewed recently and likely dealing with anemia of chronic disease. Will update iron studies to be thorough.      PLAN    Continue PPI daily Call if nausea worsens or not related to hypertension Return in 6 months Colonoscopy in 2026   Gelene Mink, PhD, ANP-BC [facility]

## 2023-08-18 ENCOUNTER — Other Ambulatory Visit: Payer: Self-pay | Admitting: Cardiology

## 2023-09-14 DIAGNOSIS — M2550 Pain in unspecified joint: Secondary | ICD-10-CM | POA: Diagnosis not present

## 2023-09-14 DIAGNOSIS — M5030 Other cervical disc degeneration, unspecified cervical region: Secondary | ICD-10-CM | POA: Diagnosis not present

## 2023-09-14 DIAGNOSIS — I10 Essential (primary) hypertension: Secondary | ICD-10-CM | POA: Diagnosis not present

## 2023-09-14 DIAGNOSIS — M1991 Primary osteoarthritis, unspecified site: Secondary | ICD-10-CM | POA: Diagnosis not present

## 2023-09-26 ENCOUNTER — Ambulatory Visit: Payer: 59 | Admitting: Orthopedic Surgery

## 2023-10-01 ENCOUNTER — Telehealth: Payer: Self-pay | Admitting: Cardiology

## 2023-10-01 MED ORDER — CHLORTHALIDONE 25 MG PO TABS: 25.0000 mg | ORAL_TABLET | Freq: Every day | ORAL | 1 refills | Status: AC

## 2023-10-01 NOTE — Telephone Encounter (Signed)
Pt's medication was sent to pt's pharmacy as requested. Confirmation received.

## 2023-10-01 NOTE — Telephone Encounter (Signed)
*  STAT* If patient is at the pharmacy, call can be transferred to refill team.   1. Which medications need to be refilled? (please list name of each medication and dose if known) chlorthalidone (HYGROTON) 25 MG tablet   2. Which pharmacy/location (including street and city if local pharmacy) is medication to be sent to?  [facility] [address], [place] - [address]    3. Do they need a 30 day or 90 day supply? 90

## 2023-10-10 ENCOUNTER — Other Ambulatory Visit: Payer: Self-pay | Admitting: Cardiology

## 2023-10-10 DIAGNOSIS — I2510 Atherosclerotic heart disease of native coronary artery without angina pectoris: Secondary | ICD-10-CM

## 2023-10-10 DIAGNOSIS — E785 Hyperlipidemia, unspecified: Secondary | ICD-10-CM

## 2023-10-10 DIAGNOSIS — I200 Unstable angina: Secondary | ICD-10-CM

## 2023-10-30 ENCOUNTER — Other Ambulatory Visit: Payer: Self-pay | Admitting: Cardiology

## 2023-11-07 ENCOUNTER — Other Ambulatory Visit: Payer: Self-pay | Admitting: Cardiology

## 2023-11-22 ENCOUNTER — Other Ambulatory Visit: Payer: Self-pay | Admitting: Adult Health

## 2023-12-11 ENCOUNTER — Ambulatory Visit: Attending: Nurse Practitioner | Admitting: Nurse Practitioner

## 2023-12-11 VITALS — BP 132/74 | HR 64 | Ht 63.0 in | Wt 169.8 lb

## 2023-12-11 DIAGNOSIS — I25118 Atherosclerotic heart disease of native coronary artery with other forms of angina pectoris: Secondary | ICD-10-CM

## 2023-12-11 DIAGNOSIS — I471 Supraventricular tachycardia, unspecified: Secondary | ICD-10-CM

## 2023-12-11 DIAGNOSIS — R002 Palpitations: Secondary | ICD-10-CM | POA: Diagnosis not present

## 2023-12-11 DIAGNOSIS — I2510 Atherosclerotic heart disease of native coronary artery without angina pectoris: Secondary | ICD-10-CM

## 2023-12-11 DIAGNOSIS — R0609 Other forms of dyspnea: Secondary | ICD-10-CM | POA: Diagnosis not present

## 2023-12-11 DIAGNOSIS — I2089 Other forms of angina pectoris: Secondary | ICD-10-CM | POA: Diagnosis not present

## 2023-12-11 DIAGNOSIS — E785 Hyperlipidemia, unspecified: Secondary | ICD-10-CM

## 2023-12-11 NOTE — Progress Notes (Unsigned)
 Office Visit    Patient Name: Diane Parker Date of Encounter: 12/11/2023  Primary Care Provider:  Elfredia Nevins, MD Primary Cardiologist:  Rollene Rotunda, MD  Chief Complaint    68 year old female  with a history of CAD s/p DES-RCA in 2018, palpitations, hypertension, hyperlipidemia, arthritis, and GERD who presents for follow-up related to CAD.  Past Medical History    Past Medical History:  Diagnosis Date   Acute diverticulitis 03/28/2013   Seen on CT - treated with flagyl an cipro   Arthritis    "shoulders, knees, hips" (03/29/2017)   CAD (coronary artery disease), native coronary artery    03/29/17 PCI/DES to the mRCA, EF normal    Essential hypertension    GERD (gastroesophageal reflux disease)    Headache    "bad ones lately; maybe q other day" (03/29/2017)   High cholesterol    Past Surgical History:  Procedure Laterality Date   ABDOMINAL HYSTERECTOMY     BREAST BIOPSY Left 06/2021   Fibrocystic changes with calcifications   COLONOSCOPY  11/08/2007   DGL:OVFIEPPIRJJO, rectosigmoid junction/hyperplastic-appearing polypoid    mucosa, 1 of these areas biopsied.  Otherwise, normal rectum/Left-sided transverse diverticula/Diminutive polyp just distal to the ileocecal valve. Hyperplastic and adenomatous polyp.    COLONOSCOPY N/A 06/26/2013   ACZ:YSAYTKZ diverticulosis. Colonic polyps removed   COLONOSCOPY WITH PROPOFOL N/A 09/22/2021   sigmoid and descending colon diverticulosis, two 3-4 mm polyps in ascending colon and cecum, one 20 mm polyp in ascending colon completely removed. Tubular adenomas. 2026 surveillance.   CORONARY ANGIOPLASTY WITH STENT PLACEMENT  03/29/2017   CORONARY STENT INTERVENTION N/A 03/29/2017   Procedure: Coronary Stent Intervention;  Surgeon: Runell Gess, MD;  Location: [facility] INVASIVE CV LAB;  Service: Cardiovascular;  Laterality: N/A;   DILATION AND CURETTAGE OF UTERUS     ESOPHAGOGASTRODUODENOSCOPY  2008   RMR: normal    ESOPHAGOGASTRODUODENOSCOPY (EGD) WITH PROPOFOL N/A 09/22/2021   small hiatal hernia, otherwise normal   LEFT HEART CATH AND CORONARY ANGIOGRAPHY N/A 03/29/2017   Procedure: Left Heart Cath and Coronary Angiography;  Surgeon: Runell Gess, MD;  Location: [facility] INVASIVE CV LAB;  Service: Cardiovascular;  Laterality: N/A;   POLYPECTOMY  09/22/2021   Procedure: POLYPECTOMY;  Surgeon: Corbin Ade, MD;  Location: [facility] ENDO SUITE;  Service: Endoscopy;;  cecal polyp, ascending colon polyp hot snare    TUBAL LIGATION      Allergies  Allergies  Allergen Reactions   Pollen Extract Other (See Comments)    Sinus symptoms, headache   Rosuvastatin Other (See Comments)    myalgias   Tape     Band aid-redness and brusing   Penicillins Rash     Labs/Other Studies Reviewed    The following studies were reviewed today:  Cardiac Studies & Procedures   ______________________________________________________________________________________________ CARDIAC CATHETERIZATION  CARDIAC CATHETERIZATION 03/29/2017  Narrative Images from the original result were not included.   Prox LAD lesion, 40 %stenosed.  Ost 1st Diag lesion, 95 %stenosed.  Mid RCA lesion, 99 %stenosed.  Post intervention, there is a 0% residual stenosis.  A stent was successfully placed.  The left ventricular systolic function is normal.  LV end diastolic pressure is normal.  The left ventricular ejection fraction is 55-65% by visual estimate.  Diane Parker is a 68 y.o. female   [id number] LOCATION:  FACILITY: [facility] PHYSICIAN: Diane Parker, M.D. Jul 19, 1956   DATE OF PROCEDURE:  03/29/2017  DATE OF DISCHARGE:     CARDIAC CATHETERIZATION / PCI-  DES RCA    History obtained from chart review.Diane Parker is a 68 y.o.female with history of hypertension, hyperlipidemia, and recent palpitations. She was seen by Dr. Purvis Sheffield on 03/19/2017 for additional complaint of exertional chest pain. The patient was  scheduled for a stress Myoview today. During the Lexus scan infusion she developed chest pain and EKG changes. Because of this the stress test was terminated and the patient was taken to the emergency room where she was stabilized and transferred to [facility] for Cardiac Catheterization to Define Her Anatomy.   PROCEDURE DESCRIPTION:  The patient was brought to the second floor [place] Cardiac cath lab in the postabsorptive state. She was not premedicated . Her right wristwas prepped and shaved in usual sterile fashion. Xylocaine 1% was used for local anesthesia. A 6 French sheath was inserted into the right radial artery using standard Seldinger technique. The patient received 3500 units  of heparin  intravenously.  A 5 [place] TIG catheter and pigtail catheters were used for selective coronary angiography and left ventriculography respectively. Isovue dye was used for the entirety of the case. Retrograde aorta, left ventricular and pullback pressures were recorded. The patient received radial cocktail via the SideArm sheath.  The patient received Plavix 600 mg by mouth addition to IV Pepcid and Angiomax IV followed by infusion. She had a therapeutic ACT demonstrated. Using a 6 [place] a.l. 0.75 cm guide catheter along with an 014 per Pro water guidewire and a 2 mm x 12 mm balloon the subtotal mid dominant RCA stenosis was crossed with mild difficulty. Followed following this angiography revealed a widely patent lumen. The wire was then a subbranch. I then stented the disease segment with a 2.25 mm x 16 mm long synergy drug-eluting stent up to 16 atm (2.4 mm) resulting in reduction of a 90% stenosis with TIMI 1 flow to 0% residual with TIMI-3 flow. The patient tolerated the procedure well. The guidewire and catheter were removed and the sheath was removed as well. A TR band was placed on the right wrist to achieve patent hemostasis. The patient left the lab in stable  condition.  Impression Successful PCI and drug-eluting stenting of the subtotal mid dominant RCA stenosis with left-to-right collaterals using a 2.5 mm synergy drug-eluting stent. Patient did have a high grade ostial first diagonal branch stenosis which was a small vessel and can be treated medically. She had normal LV function. I placed her on IV nitroglycerin because of systemic hypertension. Angiomax will be continued a focus for 4 hours. The patient will be discharged home in the morning on 2 antiplatelet therapy for 1 year uninterrupted.  Diane Parker. MD, [facility] 03/29/2017 5:08 PM  Findings Coronary Findings Diagnostic  Dominance: Right  Left Anterior Descending  First Diagonal Branch  Right Coronary Artery  Right Posterior Descending Artery Collaterals RPDA filled by collaterals from Mid LAD.  Intervention  Mid RCA lesion Angioplasty A stent was successfully placed. There is a 0% residual stenosis post intervention.   STRESS TESTS  NM MYOCAR MULTI W/SPECT W 11/25/2020  Narrative  Lexiscan stress is electrically negative for ischemia  Myovue scan shows normal perfusion No ischemia or scar  LVEF is 70%  Low risk study   ECHOCARDIOGRAM  ECHOCARDIOGRAM COMPLETE 07/02/2019  Narrative ECHOCARDIOGRAM REPORT    Patient Name:   Diane Parker Date of Exam: 07/02/2019 Medical Rec #:  [id number]        Height:       63.5 in Accession #:  [id number]       Weight:       178.0 lb Date of Birth:  11-Apr-1956        BSA:          1.85 m Patient Age:    63 years         BP:           140/80 mmHg Patient Gender: F                HR:           75 bpm. Exam Location:  [place]  Procedure: 2D Echo, Cardiac Doppler and Color Doppler  Indications:     I25.118 CAD  History:         Patient has no prior history of Echocardiogram examinations. CAD; Arrythmias:Palpitations Risk Factors:Hypertension, Dyslipidemia and Former Smoker. Cardiac catheterization done 03/29/17  showed an EF of 65%.  Sonographer:     Jake Seats RDMS, RVT, RDCS Referring Phys:  Prentice Docker, A Diagnosing Phys: Nona Dell MD  IMPRESSIONS   1. Left ventricular ejection fraction, by visual estimation, is 60 to 65%. The left ventricle has normal function. Normal left ventricular size. There is borderline left ventricular hypertrophy. 2. Left ventricular diastolic Doppler parameters are consistent with impaired relaxation pattern of LV diastolic filling. 3. Global right ventricle has normal systolic function.The right ventricular size is normal. No increase in right ventricular wall thickness. 4. Left atrial size was normal. 5. Right atrial size was normal. 6. The mitral valve is grossly normal. Mild mitral valve regurgitation. 7. The tricuspid valve is grossly normal. Tricuspid valve regurgitation is trivial. 8. The aortic valve is tricuspid Aortic valve regurgitation is trivial by color flow Doppler. Mild aortic valve sclerosis without stenosis. 9. The pulmonic valve was grossly normal. Pulmonic valve regurgitation is not visualized by color flow Doppler. 10. TR signal is inadequate for assessing pulmonary artery systolic pressure. 11. The inferior vena cava is normal in size with greater than 50% respiratory variability, suggesting right atrial pressure of 3 mmHg.  In comparison to the previous echocardiogram(s): There are no prior studies on this patient for comparison purposes. FINDINGS Left Ventricle: Left ventricular ejection fraction, by visual estimation, is 60 to 65%. The left ventricle has normal function. There is borderline left ventricular hypertrophy. Normal left ventricular size. Spectral Doppler shows Left ventricular diastolic Doppler parameters are consistent with impaired relaxation pattern of LV diastolic filling.   LV Wall Scoring: All segments are normal.  Right Ventricle: The right ventricular size is normal. No increase in right ventricular  wall thickness. Global RV systolic function is has normal systolic function. The tricuspid regurgitant velocity is 0.98 m/s, and with an assumed right atrial pressure of 3 mmHg, the estimated right ventricular systolic pressure is TR signal is inadequate for assessing PA pressure at 6.8 mmHg.  Left Atrium: Left atrial size was normal in size.  Right Atrium: Right atrial size was normal in size  Pericardium: There is no evidence of pericardial effusion.  Mitral Valve: The mitral valve is grossly normal. There is mild thickening of the mitral valve leaflet(s). Mild mitral valve regurgitation.  Tricuspid Valve: The tricuspid valve is grossly normal. Tricuspid valve regurgitation is trivial by color flow Doppler.  Aortic Valve: The aortic valve is tricuspid. Aortic valve regurgitation is trivial by color flow Doppler. Mild aortic valve sclerosis is present, with no evidence of aortic valve stenosis.  Pulmonic Valve: The pulmonic valve was grossly normal. Pulmonic valve regurgitation is  not visualized by color flow Doppler.  Aorta: The aortic root is normal in size and structure.  Venous: The inferior vena cava is normal in size with greater than 50% respiratory variability, suggesting right atrial pressure of 3 mmHg.  IAS/Shunts: No atrial level shunt detected by color flow Doppler.    LEFT VENTRICLE PLAX 2D LVIDd:         4.85 cm       Diastology LVIDs:         2.83 cm       LV e' lateral:   5.80 cm/s LV PW:         1.18 cm       LV E/e' lateral: 15.5 LV IVS:        0.98 cm       LV e' medial:    5.22 cm/s LVOT diam:     1.90 cm       LV E/e' medial:  17.2 LV SV:         80 ml LV SV Index:   41.34 LVOT Area:     2.84 cm  LV Volumes (MOD) LV area d, A2C:    19.10 cm LV area d, A4C:    23.90 cm LV area s, A2C:    8.57 cm LV area s, A4C:    12.90 cm LV major d, A2C:   7.03 cm LV major d, A4C:   7.48 cm LV major s, A2C:   5.91 cm LV major s, A4C:   6.68 cm LV vol d, MOD  A2C: 44.7 ml LV vol d, MOD A4C: 64.4 ml LV vol s, MOD A2C: 11.9 ml LV vol s, MOD A4C: 22.2 ml LV SV MOD A2C:     32.8 ml LV SV MOD A4C:     64.4 ml LV SV MOD BP:      38.1 ml  RIGHT VENTRICLE RV S prime:     12.50 cm/s TAPSE (M-mode): 2.4 cm  LEFT ATRIUM           Index LA diam:      2.50 cm 1.35 cm/m LA Vol (A2C): 56.1 ml 30.31 ml/m LA Vol (A4C): 53.8 ml 29.06 ml/m AORTIC VALVE LVOT Vmax:   126.00 cm/s LVOT Vmean:  84.000 cm/s LVOT VTI:    0.272 m  AORTA Ao Root diam: 3.50 cm  MITRAL VALVE                         TRICUSPID VALVE MV Area (PHT): 3.54 cm              TR Peak grad:   3.8 mmHg MV PHT:        62.06 msec            TR Vmax:        97.70 cm/s MV Decel Time: 214 msec MR Peak grad: 72.7 mmHg              SHUNTS MR Vmax:      426.33 cm/s            Systemic VTI:  0.27 m MV E velocity: 89.70 cm/s  103 cm/s  Systemic Diam: 1.90 cm MV A velocity: 107.00 cm/s 70.3 cm/s MV E/A ratio:  0.84        1.5   Nona Dell MD Electronically signed by Nona Dell MD Signature Date/Time: 07/02/2019/2:27:58 PM    Final    MONITORS  LONG  TERM MONITOR (3-14 DAYS) 12/27/2022  Narrative Normal sinus rhythm Rare supraventricular ectopy with the longest run being 7 beats. No sustained arrhythmias.       ______________________________________________________________________________________________     Recent Labs: No results found for requested labs within last 365 days.  Recent Lipid Panel    Component Value Date/Time   CHOL 148 03/19/2023 1132   TRIG 206 (H) 03/19/2023 1132   HDL 74 03/19/2023 1132   CHOLHDL 2.0 03/19/2023 1132   LDLCALC 42 03/19/2023 1132    History of Present Illness    68 year old female  with the above past medical history including CAD s/p DES-RCA in 2018, palpitations, hypertension, hyperlipidemia, arthritis, and GERD.  Cardiac catheterization in 2018 revealed 99% mid RCA stenosis, 90% stenosis of the ostium of the first  diagonal branch. She underwent stenting to her mid RCA.  Echocardiogram in 06/2019 showed EF 60 to 65%, normal LV function, borderline LVH, mild mitral valve regurgitation, mild aortic valve sclerosis without evidence of stenosis. Lexiscan Myoview in 2022 in the setting of chest pain was low risk. Cardiac monitor in 12/2022 the setting of palpitations revealed predominantly sinus rhythm, 4 runs of SVT, rare PACs and PVCs.  She was last seen in the office on 02/27/2023 and was stable from a cardiac standpoint.  She noted intermittent palpitations, she denied symptoms concerning for angina. Carvedilol was increased to 12.5 mg twice daily.  She presents today for follow-up.  Since her last visit she has Able overall from a cardiac standpoint.  She notes intermittent palpitations that occur predominantly at night.  She drinks alcohol and caffeine.  2-3 beers a day.  She notes chest tightness with exertion, relieved with nitroglycerin, increased dyspnea on exertion.  Muscle cramps in her calves and thighs.  Will pursue cardiac PET stress test.  Will check BMET today.  She would like to switch cardiologist from Dr. Antoine Poche to Dr. Herbie Baltimore.  I will reach out to both providers to see if they are agreeable to this.  Follow-up in 6 to 8 weeks with APP.  Reviewed ED precautions. 1. CAD: S/p DES-RCA in 2018  Informed Consent   Shared Decision Making/Informed Consent{ All outpatient stress tests require an informed consent ([id number]) ATTESTATION ORDER       :[id number]} The risks [chest pain, shortness of breath, cardiac arrhythmias, dizziness, blood pressure fluctuations, myocardial infarction, stroke/transient ischemic attack, nausea, vomiting, allergic reaction, radiation exposure, metallic taste sensation and life-threatening complications (estimated to be 1 in 10,000)], benefits (risk stratification, diagnosing coronary artery disease, treatment guidance) and alternatives of a cardiac PET stress test were discussed in  detail with Ms. Samek and she agrees to proceed.     Palpitation/SVT: Cardiac monitor in 12/2022 the setting of palpitations revealed predominantly sinus rhythm, 4 runs of SVT, rare PACs and PVCs.   Hypertension: BP well controlled. Continue current antihypertensive regimen.  Hyperlipidemia: LDL was 42 in 03/2023. Continue  Disposition: Follow-up in   Home Medications    Current Outpatient Medications  Medication Sig Dispense Refill   acetaminophen (TYLENOL) 650 MG CR tablet Take 1,300 mg by mouth every 8 (eight) hours as needed for pain.     aspirin 81 MG chewable tablet Chew 1 tablet (81 mg total) by mouth daily. 30 tablet 0   b complex vitamins tablet Take 1 tablet by mouth daily.     Baclofen 5 MG TABS Take 5 mg by mouth at bedtime.     carvedilol (COREG) 12.5 MG tablet TAKE 1 TABLET BY  MOUTH TWICE  DAILY 180 tablet 1   cetirizine (ZYRTEC) 10 MG tablet Take 10 mg by mouth daily as needed for allergies.     chlorthalidone (HYGROTON) 25 MG tablet Take 1 tablet (25 mg total) by mouth daily. 180 tablet 1   Cholecalciferol (VITAMIN D-3) 125 MCG (5000 UT) TABS Take 5,000 Units by mouth daily at 6 (six) AM.     diclofenac sodium (VOLTAREN) 1 % GEL Apply 2 g topically daily as needed (arthritis pain).     diltiazem (CARDIZEM LA) 180 MG 24 hr tablet Take 360 mg by mouth daily. Take 1 Tablet Daily. Taking 360 daily     estradiol (ESTRACE) 2 MG tablet TAKE 1 TABLET BY MOUTH DAILY 100 tablet 2   Evolocumab (REPATHA SURECLICK) 140 MG/ML SOAJ INJECT 140 MG INTO THE SKIN EVERY 14 DAYS 6 mL 3   fluticasone (FLONASE) 50 MCG/ACT nasal spray Place 2 sprays into both nostrils at bedtime as needed for allergies.     hydrOXYzine (ATARAX) 25 MG tablet Take 25 mg by mouth every 8 (eight) hours as needed.     KLOR-CON M20 20 MEQ tablet Take 20 mEq by mouth daily.     meloxicam (MOBIC) 7.5 MG tablet Take 7.5 mg by mouth as needed.     Multiple Vitamin (MULTIVITAMIN WITH MINERALS) TABS tablet Take 1 tablet by  mouth daily.     nitroGLYCERIN (NITROSTAT) 0.4 MG SL tablet Place 1 tablet (0.4 mg total) under the tongue every 5 (five) minutes x 3 doses as needed. 25 tablet 11   Omega-3 Fatty Acids (FISH OIL PO) Take by mouth. 2 at night.     omeprazole (PRILOSEC) 40 MG capsule Take 40 mg by mouth in the morning and at bedtime.     OVER THE COUNTER MEDICATION Pre and probiotic daily     OVER THE COUNTER MEDICATION OTC supplement for brain     sertraline (ZOLOFT) 50 MG tablet Take 1 tablet (50 mg total) by mouth daily. (Patient taking differently: Take 25 mg by mouth daily.) 90 tablet 4   sucralfate (CARAFATE) 1 g tablet Take 1 g by mouth. Takes as needed (Patient not taking: Reported on 12/11/2023)     traMADol (ULTRAM) 50 MG tablet Take 50 mg by mouth 4 (four) times daily as needed.     valsartan (DIOVAN) 40 MG tablet TAKE 1 TABLET BY MOUTH TWICE  DAILY ALONG WITH 80 MG TABLET  FOR TOTAL 120 MG TWICE DAILY (Patient taking differently: Take 40 mg by mouth 2 (two) times daily. TAKE 1 TABLET BY MOUTH TWICE  DAILY ALONG WITH 80 MG TABLET  FOR TOTAL 120 MG TWICE DAILY) 180 tablet 1   valsartan (DIOVAN) 80 MG tablet TAKE 1 TABLET BY MOUTH TWICE  DAILY ALONG WITH 40 MG TABLET  FOR TOTAL 120 MG 180 tablet 1   No current facility-administered medications for this visit.     Review of Systems    ***.  All other systems reviewed and are otherwise negative except as noted above.    Physical Exam    VS:  There were no vitals taken for this visit. , BMI There is no height or weight on file to calculate BMI.     GEN: Well nourished, well developed, in no acute distress. HEENT: normal. Neck: Supple, no JVD, carotid bruits, or masses. Cardiac: RRR, no murmurs, rubs, or gallops. No clubbing, cyanosis, edema.  Radials/DP/PT 2+ and equal bilaterally.  Respiratory:  Respirations regular and unlabored, clear  to auscultation bilaterally. GI: Soft, nontender, nondistended, BS + x 4. MS: no deformity or atrophy. Skin: warm  and dry, no rash. Neuro:  Strength and sensation are intact. Psych: Normal affect.  Accessory Clinical Findings    ECG personally reviewed by me today - EKG Interpretation Date/Time:  Tuesday December 11 2023 13:38:00 EDT Ventricular Rate:  64 PR Interval:  180 QRS Duration:  88 QT Interval:  412 QTC Calculation: 425 R Axis:   62  Text Interpretation: Normal sinus rhythm Normal ECG When compared with ECG of 27-Feb-2023 16:36, No significant change was found Confirmed by Bernadene Person ([id number]) on 12/11/2023 1:47:03 PM  - no acute changes.   Lab Results  Component Value Date   WBC 6.5 04/11/2022   HGB 10.8 (L) 04/11/2022   HCT 31.6 (L) 04/11/2022   MCV 89 04/11/2022   PLT 244 04/11/2022   Lab Results  Component Value Date   CREATININE 0.99 02/27/2022   BUN 18 02/27/2022   NA 138 02/27/2022   K 4.0 02/27/2022   CL 101 02/27/2022   CO2 20 02/27/2022   Lab Results  Component Value Date   ALT 21 03/29/2017   AST 24 03/29/2017   ALKPHOS 53 03/29/2017   BILITOT 0.5 03/29/2017   Lab Results  Component Value Date   CHOL 148 03/19/2023   HDL 74 03/19/2023   LDLCALC 42 03/19/2023   TRIG 206 (H) 03/19/2023   CHOLHDL 2.0 03/19/2023    No results found for: "HGBA1C"  Assessment & Plan    1.  ***  No BP recorded.  {Refresh Note OR Click here to enter BP  :1}***   Joylene Grapes, NP 12/11/2023, 1:48 PM

## 2023-12-11 NOTE — Patient Instructions (Signed)
 Medication Instructions:  Your physician recommends that you continue on your current medications as directed. Please refer to the Current Medication list given to you today.  *If you need a refill on your cardiac medications before your next appointment, please call your pharmacy*  Lab Work: BMET today  Testing/Procedures:    Please report to Radiology at the [facility] Main Entrance 30 minutes early for your test.  [address] [place], [place] [address]                         OR   Please report to Radiology at [facility] Main Entrance, medical mall, 30 mins prior to your test.  [address]  [place], [place]  How to Prepare for Your Cardiac PET/CT Stress Test:  Nothing to eat or drink, except water, 3 hours prior to arrival time.  NO caffeine/decaffeinated products, or chocolate 12 hours prior to arrival. (Please note decaffeinated beverages (teas/coffees) still contain caffeine).  If you have caffeine within 12 hours prior, the test will need to be rescheduled.  Medication instructions: Do not take erectile dysfunction medications for 72 hours prior to test (sildenafil, tadalafil) Do not take nitrates (isosorbide mononitrate, Ranexa) the day before or day of test Do not take tamsulosin the day before or morning of test Hold theophylline containing medications for 12 hours. Hold Dipyridamole 48 hours prior to the test.  Diabetic Preparation: If able to eat breakfast prior to 3 hour fasting, you may take all medications, including your insulin. Do not worry if you miss your breakfast dose of insulin - start at your next meal. If you do not eat prior to 3 hour fast-Hold all diabetes (oral and insulin) medications. Patients who wear a continuous glucose monitor MUST remove the device prior to scanning.  You may take your remaining medications with water.  NO perfume, cologne or lotion on chest or abdomen area. FEMALES - Please  avoid wearing dresses to this appointment.  Total time is 1 to 2 hours; you may want to bring reading material for the waiting time.  IF YOU THINK YOU MAY BE PREGNANT, OR ARE NURSING PLEASE INFORM THE TECHNOLOGIST.  In preparation for your appointment, medication and supplies will be purchased.  Appointment availability is limited, so if you need to cancel or reschedule, please call the Radiology Department Scheduler at [phone] 24 hours in advance to avoid a cancellation fee of $100.00  What to Expect When you Arrive:  Once you arrive and check in for your appointment, you will be taken to a preparation room within the Radiology Department.  A technologist or Nurse will obtain your medical history, verify that you are correctly prepped for the exam, and explain the procedure.  Afterwards, an IV will be started in your arm and electrodes will be placed on your skin for EKG monitoring during the stress portion of the exam. Then you will be escorted to the PET/CT scanner.  There, staff will get you positioned on the scanner and obtain a blood pressure and EKG.  During the exam, you will continue to be connected to the EKG and blood pressure machines.  A small, safe amount of a radioactive tracer will be injected in your IV to obtain a series of pictures of your heart along with an injection of a stress agent.    After your Exam:  It is recommended that you eat a meal and drink a caffeinated  beverage to counter act any effects of the stress agent.  Drink plenty of fluids for the remainder of the day and urinate frequently for the first couple of hours after the exam.  Your doctor will inform you of your test results within 7-10 business days.  For more information and frequently asked questions, please visit our website: [email or web address]  For questions about your test or how to prepare for your test, please call: Cardiac Imaging Nurse Navigators Office: [phone]    Follow-Up: At [facility], you and your health needs are our priority.  As part of our continuing mission to provide you with exceptional heart care, our providers are all part of one team.  This team includes your primary Cardiologist (physician) and Advanced Practice Providers or APPs (Physician Assistants and Nurse Practitioners) who all work together to provide you with the care you need, when you need it.  Your next appointment:   6-8 week(s)  Provider:   Bernadene Person, NP        We recommend signing up for the patient portal called "MyChart".  Sign up information is provided on this After Visit Summary.  MyChart is used to connect with patients for Virtual Visits (Telemedicine).  Patients are able to view lab/test results, encounter notes, upcoming appointments, etc.  Non-urgent messages can be sent to your provider as well.   To learn more about what you can do with MyChart, go to [email or web address].   Other Instructions       1st Floor: - Lobby - Registration  - Pharmacy  - Lab - Cafe  2nd Floor: - PV Lab - Diagnostic Testing (echo, CT, nuclear med)  3rd Floor: - Vacant  4th Floor: - TCTS (cardiothoracic surgery) - AFib Clinic - Structural Heart Clinic - Vascular Surgery  - Vascular Ultrasound  5th Floor: - HeartCare Cardiology (general and EP) - Clinical Pharmacy for coumadin, hypertension, lipid, weight-loss medications, and med management appointments    Valet parking services will be available as well.

## 2023-12-12 LAB — BASIC METABOLIC PANEL WITH GFR
BUN/Creatinine Ratio: 21 (ref 12–28)
BUN: 28 mg/dL — ABNORMAL HIGH (ref 8–27)
CO2: 20 mmol/L (ref 20–29)
Calcium: 9.8 mg/dL (ref 8.7–10.3)
Chloride: 102 mmol/L (ref 96–106)
Creatinine, Ser: 1.32 mg/dL — ABNORMAL HIGH (ref 0.57–1.00)
Glucose: 103 mg/dL — ABNORMAL HIGH (ref 70–99)
Potassium: 4.4 mmol/L (ref 3.5–5.2)
Sodium: 139 mmol/L (ref 134–144)
eGFR: 44 mL/min/{1.73_m2} — ABNORMAL LOW (ref >59)

## 2023-12-13 ENCOUNTER — Encounter: Payer: Self-pay | Admitting: Nurse Practitioner

## 2023-12-14 ENCOUNTER — Telehealth: Payer: Self-pay

## 2023-12-14 NOTE — Telephone Encounter (Signed)
 Spoke with pt. Pt was notified of lab results. Pt voiced understanding. Pt will continue current medication and f/u as planned.

## 2024-01-02 ENCOUNTER — Ambulatory Visit (INDEPENDENT_AMBULATORY_CARE_PROVIDER_SITE_OTHER): Payer: 59 | Admitting: Gastroenterology

## 2024-01-02 ENCOUNTER — Encounter: Payer: Self-pay | Admitting: Gastroenterology

## 2024-01-02 VITALS — BP 136/85 | HR 64 | Temp 97.3°F | Ht 63.0 in | Wt 171.3 lb

## 2024-01-02 DIAGNOSIS — K449 Diaphragmatic hernia without obstruction or gangrene: Secondary | ICD-10-CM | POA: Diagnosis not present

## 2024-01-02 DIAGNOSIS — K219 Gastro-esophageal reflux disease without esophagitis: Secondary | ICD-10-CM

## 2024-01-02 DIAGNOSIS — Z860101 Personal history of adenomatous and serrated colon polyps: Secondary | ICD-10-CM | POA: Diagnosis not present

## 2024-01-02 DIAGNOSIS — D369 Benign neoplasm, unspecified site: Secondary | ICD-10-CM

## 2024-01-02 NOTE — Patient Instructions (Signed)
 I am glad you are doing well!  We will see you back in January 2026 to arrange colonoscopy.  Please call with any concerns in the meantime!  I enjoyed seeing you again today! I value our relationship and want to provide genuine, compassionate, and quality care. You may receive a survey regarding your visit with me, and I welcome your feedback! Thanks so much for taking the time to complete this. I look forward to seeing you again.      Delman Ferns, PhD, ANP-BC [facility]

## 2024-01-02 NOTE — Progress Notes (Signed)
 Gastroenterology Office Note     Primary Care Physician:  Kathyleen Parkins, MD  Primary Gastroenterologist: Dr. Riley Cheadle    Chief Complaint   Chief Complaint  Patient presents with   Gastroesophageal Reflux    Follow up on GERD and anemia. Has some concerns about constipation but has been better since eating some oranges.      History of Present Illness   Diane Parker is a delightful  68 year old female with history of adenomas, undergoing surveillance colonoscopy Jan 2023 with multiple adenomas and one 20 mm adenoma removed completely. Surveillance due in 2026. Chronic GERD, intermittent nausea particularly with hypertensive spikes, normocytic anemia without IDA, last seen in Oct 2024. Returns for close follow-up.   Nausea much improved. Eating lots of oranges, which helps with consistent BMs. Exercising. Constipation was worse with certain foods on weekends with eating out. Omeprazole 40 mg daily prn and will take evening dose if needed. Lately has not needed to take PPI at all as no GERD with adjusting diet. No overt GI bleeding. Hgb 11.1 in Oct 2024. At baseline.   Seeing cardiology as having sensation of heart running away from her at night. Further evaluation upcoming. Very active.   Jan 2023 EGD: normal other than small hiatal hernia. Normocytic anemia noted but without iron deficiency      Jan 2023 colonoscopy with multiple adenomas and one 20 mm adenoma removed completely. Surveillance due in 2026.        Past Medical History:  Diagnosis Date   Acute diverticulitis 03/28/2013   Seen on CT - treated with flagyl  an cipro    Arthritis    "shoulders, knees, hips" (03/29/2017)   CAD (coronary artery disease), native coronary artery    03/29/17 PCI/DES to the mRCA, EF normal    Essential hypertension    GERD (gastroesophageal reflux disease)    Headache    "bad ones lately; maybe q other day" (03/29/2017)   High cholesterol     Past Surgical History:   Procedure Laterality Date   ABDOMINAL HYSTERECTOMY     BREAST BIOPSY Left 06/2021   Fibrocystic changes with calcifications   COLONOSCOPY  11/08/2007   [id number], rectosigmoid junction/hyperplastic-appearing polypoid    mucosa, 1 of these areas biopsied.  Otherwise, normal rectum/Left-sided transverse diverticula/Diminutive polyp just distal to the ileocecal valve. Hyperplastic and adenomatous polyp.    COLONOSCOPY N/A 06/26/2013   [id number]:[id number] diverticulosis. Colonic polyps removed   COLONOSCOPY WITH PROPOFOL  N/A 09/22/2021   sigmoid and descending colon diverticulosis, two 3-4 mm polyps in ascending colon and cecum, one 20 mm polyp in ascending colon completely removed. Tubular adenomas. 2026 surveillance.   CORONARY ANGIOPLASTY WITH STENT PLACEMENT  03/29/2017   CORONARY STENT INTERVENTION N/A 03/29/2017   Procedure: Coronary Stent Intervention;  Surgeon: Avanell Leigh, MD;  Location: [facility] INVASIVE CV LAB;  Service: Cardiovascular;  Laterality: N/A;   DILATION AND CURETTAGE OF UTERUS     ESOPHAGOGASTRODUODENOSCOPY  2008   RMR: normal   ESOPHAGOGASTRODUODENOSCOPY (EGD) WITH PROPOFOL  N/A 09/22/2021   small hiatal hernia, otherwise normal   LEFT HEART CATH AND CORONARY ANGIOGRAPHY N/A 03/29/2017   Procedure: Left Heart Cath and Coronary Angiography;  Surgeon: Avanell Leigh, MD;  Location: [facility] INVASIVE CV LAB;  Service: Cardiovascular;  Laterality: N/A;   POLYPECTOMY  09/22/2021   Procedure: POLYPECTOMY;  Surgeon: Suzette Espy, MD;  Location: [facility] ENDO SUITE;  Service: Endoscopy;;  cecal polyp, ascending colon polyp hot snare  TUBAL LIGATION      Current Outpatient Medications  Medication Sig Dispense Refill   acetaminophen  (TYLENOL ) 650 MG CR tablet Take 1,300 mg by mouth every 8 (eight) hours as needed for pain.     aspirin  81 MG chewable tablet Chew 1 tablet (81 mg total) by mouth daily. 30 tablet 0   b complex vitamins tablet Take 1 tablet by mouth daily.     Baclofen  5 MG TABS Take 5 mg by mouth at bedtime.     carvedilol  (COREG ) 12.5 MG tablet TAKE 1 TABLET BY MOUTH TWICE  DAILY 180 tablet 1   cetirizine (ZYRTEC) 10 MG tablet Take 10 mg by mouth daily as needed for allergies.     chlorthalidone  (HYGROTON ) 25 MG tablet Take 1 tablet (25 mg total) by mouth daily. 180 tablet 1   Cholecalciferol (VITAMIN D-3) 125 MCG (5000 UT) TABS Take 5,000 Units by mouth daily at 6 (six) AM.     diclofenac sodium (VOLTAREN) 1 % GEL Apply 2 g topically daily as needed (arthritis pain).     diltiazem  (CARDIZEM  LA) 180 MG 24 hr tablet Take 360 mg by mouth daily. Take 1 Tablet Daily. Taking 360 daily     estradiol  (ESTRACE ) 2 MG tablet TAKE 1 TABLET BY MOUTH DAILY 100 tablet 2   Evolocumab  (REPATHA  SURECLICK) 140 MG/ML SOAJ INJECT 140 MG INTO THE SKIN EVERY 14 DAYS 6 mL 3   fluticasone (FLONASE) 50 MCG/ACT nasal spray Place 2 sprays into both nostrils at bedtime as needed for allergies.     hydrOXYzine (ATARAX) 25 MG tablet Take 25 mg by mouth every 8 (eight) hours as needed.     KLOR-CON  M20 20 MEQ tablet Take 20 mEq by mouth daily.     meloxicam (MOBIC) 7.5 MG tablet Take 7.5 mg by mouth as needed.     Multiple Vitamin (MULTIVITAMIN WITH MINERALS) TABS tablet Take 1 tablet by mouth daily.     nitroGLYCERIN  (NITROSTAT ) 0.4 MG SL tablet Place 1 tablet (0.4 mg total) under the tongue every 5 (five) minutes x 3 doses as needed. 25 tablet 11   Omega-3 Fatty Acids (FISH OIL PO) Take by mouth. 2 at night.     omeprazole (PRILOSEC) 40 MG capsule Take 40 mg by mouth in the morning and at bedtime.     OVER THE COUNTER MEDICATION Pre and probiotic daily     OVER THE COUNTER MEDICATION OTC supplement for brain     sertraline  (ZOLOFT ) 50 MG tablet Take 1 tablet (50 mg total) by mouth daily. (Patient taking differently: Take 25 mg by mouth daily.) 90 tablet 4   traMADol  (ULTRAM ) 50 MG tablet Take 50 mg by mouth 4 (four) times daily as needed.     valsartan  (DIOVAN ) 40 MG tablet TAKE 1  TABLET BY MOUTH TWICE  DAILY ALONG WITH 80 MG TABLET  FOR TOTAL 120 MG TWICE DAILY (Patient taking differently: Take 40 mg by mouth 2 (two) times daily. TAKE 1 TABLET BY MOUTH TWICE  DAILY ALONG WITH 80 MG TABLET  FOR TOTAL 120 MG TWICE DAILY) 180 tablet 1   valsartan  (DIOVAN ) 80 MG tablet TAKE 1 TABLET BY MOUTH TWICE  DAILY ALONG WITH 40 MG TABLET  FOR TOTAL 120 MG 180 tablet 1   No current facility-administered medications for this visit.    Allergies as of 01/02/2024 - Review Complete 01/02/2024  Allergen Reaction Noted   Pollen extract Other (See Comments) 04/22/2021   Rosuvastatin  Other (See  Comments) 10/12/2022   Tape  04/22/2021   Penicillins Rash 05/30/2019    Family History  Problem Relation Age of Onset   Heart disease Father    Aneurysm Mother    Cancer Cousin        breast   Other Daughter        PTSD   Colon cancer Neg Hx     Social History   Socioeconomic History   Marital status: Single    Spouse name: Not on file   Number of children: Not on file   Years of education: Not on file   Highest education level: Not on file  Occupational History   Not on file  Tobacco Use   Smoking status: Former    Current packs/day: 0.00    Average packs/day: 0.1 packs/day for 3.0 years (0.4 ttl pk-yrs)    Types: Cigarettes    Start date: 63    Quit date: 61    Years since quitting: 35.3    Passive exposure: Current   Smokeless tobacco: Never  Vaping Use   Vaping status: Never Used  Substance and Sexual Activity   Alcohol use: Yes    Alcohol/week: 6.0 standard drinks of alcohol    Types: 6 Cans of beer per week   Drug use: Yes    Types: Marijuana    Comment: "very seldom"   Sexual activity: Not Currently    Birth control/protection: Surgical    Comment: tubal/hyst  Other Topics Concern   Not on file  Social History Narrative   Not on file   Social Drivers of Health   Financial Resource Strain: Low Risk  (06/06/2022)   Overall Financial Resource Strain  (CARDIA)    Difficulty of Paying Living Expenses: Not hard at all  Food Insecurity: No Food Insecurity (06/06/2022)   Hunger Vital Sign    Worried About Running Out of Food in the Last Year: Never true    Ran Out of Food in the Last Year: Never true  Transportation Needs: No Transportation Needs (06/06/2022)   PRAPARE - Administrator, Civil Service (Medical): No    Lack of Transportation (Non-Medical): No  Physical Activity: Sufficiently Active (06/06/2022)   Exercise Vital Sign    Days of Exercise per Week: 7 days    Minutes of Exercise per Session: 60 min  Stress: No Stress Concern Present (06/06/2022)   [employer] of Occupational Health - Occupational Stress Questionnaire    Feeling of Stress : Only a little  Social Connections: Socially Integrated (06/06/2022)   Social Connection and Isolation Panel [NHANES]    Frequency of Communication with Friends and Family: More than three times a week    Frequency of Social Gatherings with Friends and Family: More than three times a week    Attends Religious Services: 1 to 4 times per year    Active Member of [employer] or Organizations: Yes    Attends Banker Meetings: 1 to 4 times per year    Marital Status: Married  Catering manager Violence: Not At Risk (06/06/2022)   Humiliation, Afraid, Rape, and Kick questionnaire    Fear of Current or Ex-Partner: No    Emotionally Abused: No    Physically Abused: No    Sexually Abused: No     Review of Systems   Gen: Denies any fever, chills, fatigue, weight loss, lack of appetite.  CV: Denies chest pain, heart palpitations, peripheral edema, syncope.  Resp: Denies shortness of breath  at rest or with exertion. Denies wheezing or cough.  GI: Denies dysphagia or odynophagia. Denies jaundice, hematemesis, fecal incontinence. GU : Denies urinary burning, urinary frequency, urinary hesitancy MS: Denies joint pain, muscle weakness, cramps, or limitation of movement.  Derm:  Denies rash, itching, dry skin Psych: Denies depression, anxiety, memory loss, and confusion Heme: Denies bruising, bleeding, and enlarged lymph nodes.   Physical Exam   BP 136/85   Pulse 64   Temp (!) 97.3 F (36.3 C)   Ht 5\' 3"  (1.6 m)   Wt 171 lb 4.8 oz (77.7 kg)   BMI 30.34 kg/m  General:   Alert and oriented. Pleasant and cooperative. Well-nourished and well-developed.  Head:  Normocephalic and atraumatic. Eyes:  Without icterus Abdomen:  +BS, soft, non-tender and non-distended. No HSM noted. No guarding or rebound. No masses appreciated.  Rectal:  Deferred  Msk:  Symmetrical without gross deformities. Normal posture. Extremities:  Without edema. Neurologic:  Alert and  oriented x4;  grossly normal neurologically. Skin:  Intact without significant lesions or rashes. Psych:  Alert and cooperative. Normal mood and affect.   Assessment   Diane Parker is a delightful  68 year old female with history of adenomas, GERD, nausea in the past, normocytic anemia without IDA, returning for routine follow-up.  GERD: much improved with dietary changes and only taking omeprazole prn. EGD Jan 2023 normal other than small hiatal hernia.  Nausea: resolved.   History of adenomas: undergoing surveillance colonoscopy Jan 2023 with multiple adenomas and one 20 mm adenoma removed completely. Surveillance due in 2026. No overt GI bleeding or concerning signs/symptoms.     PLAN    Continue PPI prn Continue excellent diet/behavior modifications Return Jan 2026 to arrange colonoscopy   Delman Ferns, PhD, ANP-BC [facility]

## 2024-01-08 ENCOUNTER — Other Ambulatory Visit: Payer: Self-pay | Admitting: Cardiology

## 2024-01-29 ENCOUNTER — Ambulatory Visit: Attending: Nurse Practitioner | Admitting: Nurse Practitioner

## 2024-01-29 NOTE — Progress Notes (Deleted)
 Office Visit    Patient Name: Diane Parker Date of Encounter: 01/29/2024  Primary Care Provider:  Kathyleen Parkins, MD Primary Cardiologist:  Eilleen Grates, MD  Chief Complaint    68 year old female with a history of CAD s/p DES-RCA in 2018, palpitations, hypertension, hyperlipidemia, arthritis, and GERD who presents for follow-up related to CAD.   Past Medical History    Past Medical History:  Diagnosis Date   Acute diverticulitis 03/28/2013   Seen on CT - treated with flagyl  an cipro    Arthritis    "shoulders, knees, hips" (03/29/2017)   CAD (coronary artery disease), native coronary artery    03/29/17 PCI/DES to the mRCA, EF normal    Essential hypertension    GERD (gastroesophageal reflux disease)    Headache    "bad ones lately; maybe q other day" (03/29/2017)   High cholesterol    Past Surgical History:  Procedure Laterality Date   ABDOMINAL HYSTERECTOMY     BREAST BIOPSY Left 06/2021   Fibrocystic changes with calcifications   COLONOSCOPY  11/08/2007   ZOX:[id number], rectosigmoid junction/hyperplastic-appearing polypoid    mucosa, 1 of these areas biopsied.  Otherwise, normal rectum/Left-sided transverse diverticula/Diminutive polyp just distal to the ileocecal valve. Hyperplastic and adenomatous polyp.    COLONOSCOPY N/A 06/26/2013   BJY:NWGNFAO diverticulosis. Colonic polyps removed   COLONOSCOPY WITH PROPOFOL  N/A 09/22/2021   sigmoid and descending colon diverticulosis, two 3-4 mm polyps in ascending colon and cecum, one 20 mm polyp in ascending colon completely removed. Tubular adenomas. 2026 surveillance.   CORONARY ANGIOPLASTY WITH STENT PLACEMENT  03/29/2017   CORONARY STENT INTERVENTION N/A 03/29/2017   Procedure: Coronary Stent Intervention;  Surgeon: Avanell Leigh, MD;  Location: [facility] INVASIVE CV LAB;  Service: Cardiovascular;  Laterality: N/A;   DILATION AND CURETTAGE OF UTERUS     ESOPHAGOGASTRODUODENOSCOPY  2008   RMR: normal    ESOPHAGOGASTRODUODENOSCOPY (EGD) WITH PROPOFOL  N/A 09/22/2021   small hiatal hernia, otherwise normal   LEFT HEART CATH AND CORONARY ANGIOGRAPHY N/A 03/29/2017   Procedure: Left Heart Cath and Coronary Angiography;  Surgeon: Avanell Leigh, MD;  Location: [facility] INVASIVE CV LAB;  Service: Cardiovascular;  Laterality: N/A;   POLYPECTOMY  09/22/2021   Procedure: POLYPECTOMY;  Surgeon: Suzette Espy, MD;  Location: [facility] ENDO SUITE;  Service: Endoscopy;;  cecal polyp, ascending colon polyp hot snare    TUBAL LIGATION      Allergies  Allergies  Allergen Reactions   Pollen Extract Other (See Comments)    Sinus symptoms, headache   Rosuvastatin  Other (See Comments)    myalgias   Tape     Band aid-redness and brusing   Penicillins Rash     Labs/Other Studies Reviewed    The following studies were reviewed today: *** Cardiac Studies & Procedures   ______________________________________________________________________________________________ CARDIAC CATHETERIZATION  CARDIAC CATHETERIZATION 03/29/2017  Conclusion Images from the original result were not included.   Prox LAD lesion, 40 %stenosed.  Ost 1st Diag lesion, 95 %stenosed.  Mid RCA lesion, 99 %stenosed.  Post intervention, there is a 0% residual stenosis.  A stent was successfully placed.  The left ventricular systolic function is normal.  LV end diastolic pressure is normal.  The left ventricular ejection fraction is 55-65% by visual estimate.  Diane Parker is a 68 y.o. female   [id number] LOCATION:  FACILITY: [facility] PHYSICIAN: Lauro Portal, M.D. [birth year]   DATE OF PROCEDURE:  03/29/2017  DATE OF DISCHARGE:     CARDIAC CATHETERIZATION / PCI-  DES RCA    History obtained from chart review.Diane Parker is a 68 y.o.female with history of hypertension, hyperlipidemia, and recent palpitations. She was seen by Dr. Wanetta Guthrie on 03/19/2017 for additional complaint of exertional chest pain. The patient was  scheduled for a stress Myoview  today. During the Lexus scan infusion she developed chest pain and EKG changes. Because of this the stress test was terminated and the patient was taken to the emergency room where she was stabilized and transferred to [facility] for Cardiac Catheterization to Define Her Anatomy.   PROCEDURE DESCRIPTION:  The patient was brought to the second floor [facility] cath lab in the postabsorptive state. She was not premedicated . Her right wristwas prepped and shaved in usual sterile fashion. Xylocaine  1% was used for local anesthesia. A 6 French sheath was inserted into the right radial artery using standard Seldinger technique. The patient received 3500 units  of heparin   intravenously.  A 5 [place] TIG catheter and pigtail catheters were used for selective coronary angiography and left ventriculography respectively. Isovue  dye was used for the entirety of the case. Retrograde aorta, left ventricular and pullback pressures were recorded. The patient received radial cocktail via the SideArm sheath.  The patient received Plavix  600 mg by mouth addition to IV Pepcid  and Angiomax  IV followed by infusion. She had a therapeutic ACT demonstrated. Using a 6 [place] a.l. 0.75 cm guide catheter along with an 014 per Pro water  guidewire and a 2 mm x 12 mm balloon the subtotal mid dominant RCA stenosis was crossed with mild difficulty. Followed following this angiography revealed a widely patent lumen. The wire was then a subbranch. I then stented the disease segment with a 2.25 mm x 16 mm long synergy drug-eluting stent up to 16 atm (2.4 mm) resulting in reduction of a 90% stenosis with TIMI 1 flow to 0% residual with TIMI-3 flow. The patient tolerated the procedure well. The guidewire and catheter were removed and the sheath was removed as well. A TR band was placed on the right wrist to achieve patent hemostasis. The patient left the lab in stable  condition.  Impression Successful PCI and drug-eluting stenting of the subtotal mid dominant RCA stenosis with left-to-right collaterals using a 2.5 mm synergy drug-eluting stent. Patient did have a high grade ostial first diagonal branch stenosis which was a small vessel and can be treated medically. She had normal LV function. I placed her on IV nitroglycerin  because of systemic hypertension. Angiomax  will be continued a focus for 4 hours. The patient will be discharged home in the morning on 2 antiplatelet therapy for 1 year uninterrupted.  Lauro Portal. MD, [facility] 03/29/2017 5:08 PM  Findings Coronary Findings Diagnostic  Dominance: Right  Left Anterior Descending  First Diagonal Branch  Right Coronary Artery  Right Posterior Descending Artery Collaterals RPDA filled by collaterals from Mid LAD.  Intervention  Mid RCA lesion Angioplasty A stent was successfully placed. There is a 0% residual stenosis post intervention.   STRESS TESTS  NM MYOCAR MULTI W/SPECT W 11/25/2020  Narrative  Lexiscan  stress is electrically negative for ischemia  Myovue scan shows normal perfusion No ischemia or scar  LVEF is 70%  Low risk study   ECHOCARDIOGRAM  ECHOCARDIOGRAM COMPLETE 07/02/2019  Narrative ECHOCARDIOGRAM REPORT    Patient Name:   KATRIEL CUTSFORTH Date of Exam: 07/02/2019 Medical Rec #:  [id number]        Height:       63.5 in Accession #:  [id number]       Weight:       178.0 lb Date of Birth:  1956-03-15        BSA:          1.85 m Patient Age:    63 years         BP:           140/80 mmHg Patient Gender: F                HR:           75 bpm. Exam Location:  [place]  Procedure: 2D Echo, Cardiac Doppler and Color Doppler  Indications:     I25.118 CAD  History:         Patient has no prior history of Echocardiogram examinations. CAD; Arrythmias:Palpitations Risk Factors:Hypertension, Dyslipidemia and Former Smoker. Cardiac catheterization done 03/29/17  showed an EF of 65%.  Sonographer:     Lyndal Sandy RDMS, RVT, RDCS Referring Phys:  Drake Gens, A Diagnosing Phys: Samuel Mcdowell MD  IMPRESSIONS   1. Left ventricular ejection fraction, by visual estimation, is 60 to 65%. The left ventricle has normal function. Normal left ventricular size. There is borderline left ventricular hypertrophy. 2. Left ventricular diastolic Doppler parameters are consistent with impaired relaxation pattern of LV diastolic filling. 3. Global right ventricle has normal systolic function.The right ventricular size is normal. No increase in right ventricular wall thickness. 4. Left atrial size was normal. 5. Right atrial size was normal. 6. The mitral valve is grossly normal. Mild mitral valve regurgitation. 7. The tricuspid valve is grossly normal. Tricuspid valve regurgitation is trivial. 8. The aortic valve is tricuspid Aortic valve regurgitation is trivial by color flow Doppler. Mild aortic valve sclerosis without stenosis. 9. The pulmonic valve was grossly normal. Pulmonic valve regurgitation is not visualized by color flow Doppler. 10. TR signal is inadequate for assessing pulmonary artery systolic pressure. 11. The inferior vena cava is normal in size with greater than 50% respiratory variability, suggesting right atrial pressure of 3 mmHg.  In comparison to the previous echocardiogram(s): There are no prior studies on this patient for comparison purposes. FINDINGS Left Ventricle: Left ventricular ejection fraction, by visual estimation, is 60 to 65%. The left ventricle has normal function. There is borderline left ventricular hypertrophy. Normal left ventricular size. Spectral Doppler shows Left ventricular diastolic Doppler parameters are consistent with impaired relaxation pattern of LV diastolic filling.   LV Wall Scoring: All segments are normal.  Right Ventricle: The right ventricular size is normal. No increase in right ventricular  wall thickness. Global RV systolic function is has normal systolic function. The tricuspid regurgitant velocity is 0.98 m/s, and with an assumed right atrial pressure of 3 mmHg, the estimated right ventricular systolic pressure is TR signal is inadequate for assessing PA pressure at 6.8 mmHg.  Left Atrium: Left atrial size was normal in size.  Right Atrium: Right atrial size was normal in size  Pericardium: There is no evidence of pericardial effusion.  Mitral Valve: The mitral valve is grossly normal. There is mild thickening of the mitral valve leaflet(s). Mild mitral valve regurgitation.  Tricuspid Valve: The tricuspid valve is grossly normal. Tricuspid valve regurgitation is trivial by color flow Doppler.  Aortic Valve: The aortic valve is tricuspid. Aortic valve regurgitation is trivial by color flow Doppler. Mild aortic valve sclerosis is present, with no evidence of aortic valve stenosis.  Pulmonic Valve: The pulmonic valve was grossly normal. Pulmonic valve regurgitation is  not visualized by color flow Doppler.  Aorta: The aortic root is normal in size and structure.  Venous: The inferior vena cava is normal in size with greater than 50% respiratory variability, suggesting right atrial pressure of 3 mmHg.  IAS/Shunts: No atrial level shunt detected by color flow Doppler.    LEFT VENTRICLE PLAX 2D LVIDd:         4.85 cm       Diastology LVIDs:         2.83 cm       LV e' lateral:   5.80 cm/s LV PW:         1.18 cm       LV E/e' lateral: 15.5 LV IVS:        0.98 cm       LV e' medial:    5.22 cm/s LVOT diam:     1.90 cm       LV E/e' medial:  17.2 LV SV:         80 ml LV SV Index:   41.34 LVOT Area:     2.84 cm  LV Volumes (MOD) LV area d, A2C:    19.10 cm LV area d, A4C:    23.90 cm LV area s, A2C:    8.57 cm LV area s, A4C:    12.90 cm LV major d, A2C:   7.03 cm LV major d, A4C:   7.48 cm LV major s, A2C:   5.91 cm LV major s, A4C:   6.68 cm LV vol d, MOD  A2C: 44.7 ml LV vol d, MOD A4C: 64.4 ml LV vol s, MOD A2C: 11.9 ml LV vol s, MOD A4C: 22.2 ml LV SV MOD A2C:     32.8 ml LV SV MOD A4C:     64.4 ml LV SV MOD BP:      38.1 ml  RIGHT VENTRICLE RV S prime:     12.50 cm/s TAPSE (M-mode): 2.4 cm  LEFT ATRIUM           Index LA diam:      2.50 cm 1.35 cm/m LA Vol (A2C): 56.1 ml 30.31 ml/m LA Vol (A4C): 53.8 ml 29.06 ml/m AORTIC VALVE LVOT Vmax:   126.00 cm/s LVOT Vmean:  84.000 cm/s LVOT VTI:    0.272 m  AORTA Ao Root diam: 3.50 cm  MITRAL VALVE                         TRICUSPID VALVE MV Area (PHT): 3.54 cm              TR Peak grad:   3.8 mmHg MV PHT:        62.06 msec            TR Vmax:        97.70 cm/s MV Decel Time: 214 msec MR Peak grad: 72.7 mmHg              SHUNTS MR Vmax:      426.33 cm/s            Systemic VTI:  0.27 m MV E velocity: 89.70 cm/s  103 cm/s  Systemic Diam: 1.90 cm MV A velocity: 107.00 cm/s 70.3 cm/s MV E/A ratio:  0.84        1.5   Teddie Favre MD Electronically signed by Teddie Favre MD Signature Date/Time: 07/02/2019/2:27:58 PM    Final    MONITORS  LONG  TERM MONITOR (3-14 DAYS) 12/27/2022  Narrative Normal sinus rhythm Rare supraventricular ectopy with the longest run being 7 beats. No sustained arrhythmias.       ______________________________________________________________________________________________     Recent Labs: 12/11/2023: BUN 28; Creatinine, Ser 1.32; Potassium 4.4; Sodium 139  Recent Lipid Panel    Component Value Date/Time   CHOL 148 03/19/2023 1132   TRIG 206 (H) 03/19/2023 1132   HDL 74 03/19/2023 1132   CHOLHDL 2.0 03/19/2023 1132   LDLCALC 42 03/19/2023 1132    History of Present Illness    68 year old female  with the above past medical history including CAD s/p DES-RCA in 2018, palpitations, hypertension, hyperlipidemia, arthritis, and GERD.   Cardiac catheterization in 2018 revealed 99% mid RCA stenosis, 90% stenosis of the ostium of the  first diagonal branch. She underwent stenting to her mid RCA.  Echocardiogram in 06/2019 showed EF 60 to 65%, normal LV function, borderline LVH, mild mitral valve regurgitation, mild aortic valve sclerosis without evidence of stenosis. Lexiscan  Myoview  in 2022 in the setting of chest pain was low risk. Cardiac monitor in 12/2022 the setting of palpitations revealed predominantly sinus rhythm, 4 runs of SVT, rare PACs and PVCs.  Carvedilol  was increased to 12.5 mg twice daily. She was last seen in the office on 12/11/2023 and was stable from a cardiac standpoint.  She reported intermittent palpitations, intermittent chest tightness with exertion, relieved with nitroglycerin , increased dyspnea on exertion.  Cardiac PET stress test was ordered but not completed.  She presents today for follow-up. Since her last visit she   1. CAD: S/p DES-RCA in 2018.  Echocardiogram in 06/2019 showed EF 60 to 65%, normal LV function, borderline LVH, mild mitral valve regurgitation, mild aortic valve sclerosis without evidence of stenosis. Lexiscan  Myoview  in 2022 in the setting of chest pain was low risk. She reports symptoms concerning for stable angina, intermittent chest tightness with exertion, relieved with nitroglycerin , increased dyspnea on exertion.  Euvolemic and well compensated on exam. Through shared decision making, will pursue cardiac PET stress test to rule out ischemia.  She is not a treadmill candidate in the setting of severe arthritis to her hip and knee.  She also reports frequent muscle cramps in her calves and thighs at rest, denies any other symptoms concerning for claudication.  Will check BMET today.  Reviewed ED precautions.  Continue aspirin , carvedilol , valsartan , chlorthalidone , diltiazem , Repatha .    2. Palpitation/SVT: Cardiac monitor in 12/2022 the setting of palpitations revealed predominantly sinus rhythm, 4 runs of SVT, rare PACs and PVCs.  She reports intermittent fleeting palpitations, overall  stable.  Continue carvedilol , diltiazem .   3. Hypertension: BP well controlled. Continue current antihypertensive regimen.    4. Hyperlipidemia: LDL was 42 in 03/2023.  Statin intolerant.  Continue Repatha .   5. Disposition: Follow-up in 6 to 8 weeks with [facility].  She would like to switch cardiologist from Dr. Lavonne Prairie to Dr. Addie Holstein. I will reach out to both providers to see if they are agreeable to this change.  Home Medications    Current Outpatient Medications  Medication Sig Dispense Refill   acetaminophen  (TYLENOL ) 650 MG CR tablet Take 1,300 mg by mouth every 8 (eight) hours as needed for pain.     aspirin  81 MG chewable tablet Chew 1 tablet (81 mg total) by mouth daily. 30 tablet 0   b complex vitamins tablet Take 1 tablet by mouth daily.     Baclofen 5 MG TABS Take 5 mg by mouth at  bedtime.     carvedilol  (COREG ) 12.5 MG tablet TAKE 1 TABLET BY MOUTH TWICE  DAILY 180 tablet 1   cetirizine (ZYRTEC) 10 MG tablet Take 10 mg by mouth daily as needed for allergies.     chlorthalidone  (HYGROTON ) 25 MG tablet Take 1 tablet (25 mg total) by mouth daily. 180 tablet 1   Cholecalciferol (VITAMIN D-3) 125 MCG (5000 UT) TABS Take 5,000 Units by mouth daily at 6 (six) AM.     diclofenac sodium (VOLTAREN) 1 % GEL Apply 2 g topically daily as needed (arthritis pain).     diltiazem  (CARDIZEM  LA) 180 MG 24 hr tablet Take 360 mg by mouth daily. Take 1 Tablet Daily. Taking 360 daily     estradiol  (ESTRACE ) 2 MG tablet TAKE 1 TABLET BY MOUTH DAILY 100 tablet 2   Evolocumab  (REPATHA  SURECLICK) 140 MG/ML SOAJ INJECT 140 MG INTO THE SKIN EVERY 14 DAYS 6 mL 3   fluticasone (FLONASE) 50 MCG/ACT nasal spray Place 2 sprays into both nostrils at bedtime as needed for allergies.     hydrOXYzine (ATARAX) 25 MG tablet Take 25 mg by mouth every 8 (eight) hours as needed.     KLOR-CON  M20 20 MEQ tablet Take 20 mEq by mouth daily.     meloxicam (MOBIC) 7.5 MG tablet Take 7.5 mg by mouth as needed.     Multiple Vitamin  (MULTIVITAMIN WITH MINERALS) TABS tablet Take 1 tablet by mouth daily.     nitroGLYCERIN  (NITROSTAT ) 0.4 MG SL tablet Place 1 tablet (0.4 mg total) under the tongue every 5 (five) minutes x 3 doses as needed. 25 tablet 11   Omega-3 Fatty Acids (FISH OIL PO) Take by mouth. 2 at night.     omeprazole (PRILOSEC) 40 MG capsule Take 40 mg by mouth in the morning and at bedtime.     OVER THE COUNTER MEDICATION Pre and probiotic daily     OVER THE COUNTER MEDICATION OTC supplement for brain     sertraline  (ZOLOFT ) 50 MG tablet Take 1 tablet (50 mg total) by mouth daily. (Patient taking differently: Take 25 mg by mouth daily.) 90 tablet 4   traMADol  (ULTRAM ) 50 MG tablet Take 50 mg by mouth 4 (four) times daily as needed.     valsartan  (DIOVAN ) 40 MG tablet TAKE 1 TABLET BY MOUTH TWICE  DAILY ALONG WITH 80 MG TABLET  FOR TOTAL 120 MG TWICE DAILY 180 tablet 3   valsartan  (DIOVAN ) 80 MG tablet TAKE 1 TABLET BY MOUTH TWICE  DAILY ALONG WITH 40 MG TABLET  FOR TOTAL 120 MG 180 tablet 3   No current facility-administered medications for this visit.     Review of Systems    ***.  All other systems reviewed and are otherwise negative except as noted above.    Physical Exam    VS:  There were no vitals taken for this visit. , BMI There is no height or weight on file to calculate BMI.     GEN: Well nourished, well developed, in no acute distress. HEENT: normal. Neck: Supple, no JVD, carotid bruits, or masses. Cardiac: RRR, no murmurs, rubs, or gallops. No clubbing, cyanosis, edema.  Radials/DP/PT 2+ and equal bilaterally.  Respiratory:  Respirations regular and unlabored, clear to auscultation bilaterally. GI: Soft, nontender, nondistended, BS + x 4. MS: no deformity or atrophy. Skin: warm and dry, no rash. Neuro:  Strength and sensation are intact. Psych: Normal affect.  Accessory Clinical Findings    ECG personally  reviewed by me today -    - no acute changes.   Lab Results  Component Value  Date   WBC 6.5 04/11/2022   HGB 10.8 (L) 04/11/2022   HCT 31.6 (L) 04/11/2022   MCV 89 04/11/2022   PLT 244 04/11/2022   Lab Results  Component Value Date   CREATININE 1.32 (H) 12/11/2023   BUN 28 (H) 12/11/2023   NA 139 12/11/2023   K 4.4 12/11/2023   CL 102 12/11/2023   CO2 20 12/11/2023   Lab Results  Component Value Date   ALT 21 03/29/2017   AST 24 03/29/2017   ALKPHOS 53 03/29/2017   BILITOT 0.5 03/29/2017   Lab Results  Component Value Date   CHOL 148 03/19/2023   HDL 74 03/19/2023   LDLCALC 42 03/19/2023   TRIG 206 (H) 03/19/2023   CHOLHDL 2.0 03/19/2023    No results found for: "HGBA1C"  Assessment & Plan    1.  ***  No BP recorded.  {Refresh Note OR Click here to enter BP  :1}***   Jude Norton, NP 01/29/2024, 8:49 AM

## 2024-02-05 ENCOUNTER — Ambulatory Visit: Admitting: Nurse Practitioner

## 2024-02-05 DIAGNOSIS — M1991 Primary osteoarthritis, unspecified site: Secondary | ICD-10-CM | POA: Diagnosis not present

## 2024-02-05 DIAGNOSIS — J329 Chronic sinusitis, unspecified: Secondary | ICD-10-CM | POA: Diagnosis not present

## 2024-02-05 DIAGNOSIS — H7090 Unspecified mastoiditis, unspecified ear: Secondary | ICD-10-CM | POA: Diagnosis not present

## 2024-02-05 DIAGNOSIS — I10 Essential (primary) hypertension: Secondary | ICD-10-CM | POA: Diagnosis not present

## 2024-02-05 DIAGNOSIS — K219 Gastro-esophageal reflux disease without esophagitis: Secondary | ICD-10-CM | POA: Diagnosis not present

## 2024-02-13 ENCOUNTER — Other Ambulatory Visit: Payer: Self-pay | Admitting: Cardiology

## 2024-02-27 DIAGNOSIS — M2550 Pain in unspecified joint: Secondary | ICD-10-CM | POA: Diagnosis not present

## 2024-02-27 DIAGNOSIS — Z9229 Personal history of other drug therapy: Secondary | ICD-10-CM | POA: Diagnosis not present

## 2024-02-27 DIAGNOSIS — G894 Chronic pain syndrome: Secondary | ICD-10-CM | POA: Diagnosis not present

## 2024-02-27 DIAGNOSIS — K219 Gastro-esophageal reflux disease without esophagitis: Secondary | ICD-10-CM | POA: Diagnosis not present

## 2024-02-27 DIAGNOSIS — I10 Essential (primary) hypertension: Secondary | ICD-10-CM | POA: Diagnosis not present

## 2024-02-27 DIAGNOSIS — E559 Vitamin D deficiency, unspecified: Secondary | ICD-10-CM | POA: Diagnosis not present

## 2024-02-27 DIAGNOSIS — N390 Urinary tract infection, site not specified: Secondary | ICD-10-CM | POA: Diagnosis not present

## 2024-02-27 DIAGNOSIS — M5030 Other cervical disc degeneration, unspecified cervical region: Secondary | ICD-10-CM | POA: Diagnosis not present

## 2024-02-27 DIAGNOSIS — Z0001 Encounter for general adult medical examination with abnormal findings: Secondary | ICD-10-CM | POA: Diagnosis not present

## 2024-02-27 DIAGNOSIS — E039 Hypothyroidism, unspecified: Secondary | ICD-10-CM | POA: Diagnosis not present

## 2024-02-27 DIAGNOSIS — M1991 Primary osteoarthritis, unspecified site: Secondary | ICD-10-CM | POA: Diagnosis not present

## 2024-02-28 LAB — LAB REPORT - SCANNED
EGFR: 57
TSH: 1.87 (ref 0.41–5.90)

## 2024-03-03 DIAGNOSIS — R002 Palpitations: Secondary | ICD-10-CM | POA: Diagnosis not present

## 2024-03-11 ENCOUNTER — Encounter (HOSPITAL_COMMUNITY): Payer: Self-pay

## 2024-03-11 ENCOUNTER — Telehealth (HOSPITAL_COMMUNITY): Payer: Self-pay | Admitting: *Deleted

## 2024-03-11 NOTE — Telephone Encounter (Signed)
Attempted to call patient regarding upcoming cardiac PET appointment. Left message on voicemail with name and callback number  [name] RN Navigator Cardiac Imaging [facility] [phone] Office [phone] Cell  Reminder to avoid caffeine 12 hours prior to cardiac PET appt.

## 2024-03-12 ENCOUNTER — Ambulatory Visit (HOSPITAL_COMMUNITY)
Admission: RE | Admit: 2024-03-12 | Discharge: 2024-03-12 | Disposition: A | Discharge status: Home / Self Care | Source: Ambulatory Visit | Attending: Nurse Practitioner | Admitting: Nurse Practitioner

## 2024-03-12 DIAGNOSIS — I25118 Atherosclerotic heart disease of native coronary artery with other forms of angina pectoris: Secondary | ICD-10-CM | POA: Insufficient documentation

## 2024-03-12 DIAGNOSIS — R0609 Other forms of dyspnea: Secondary | ICD-10-CM | POA: Diagnosis not present

## 2024-03-12 DIAGNOSIS — I2089 Other forms of angina pectoris: Secondary | ICD-10-CM | POA: Insufficient documentation

## 2024-03-12 MED ORDER — RUBIDIUM RB82 GENERATOR (RUBYFILL)
20.3000 | PACK | Freq: Once | INTRAVENOUS | Status: AC
  Administered 2024-03-12: 20.3 via INTRAVENOUS

## 2024-03-12 MED ORDER — RUBIDIUM RB82 GENERATOR (RUBYFILL)
20.3200 | PACK | Freq: Once | INTRAVENOUS | Status: AC
  Administered 2024-03-12: 19.97 via INTRAVENOUS

## 2024-03-12 MED ORDER — REGADENOSON 0.4 MG/5ML IV SOLN
0.4000 mg | Freq: Once | INTRAVENOUS | Status: AC
  Administered 2024-03-12: 0.4 mg via INTRAVENOUS

## 2024-03-12 MED ORDER — REGADENOSON 0.4 MG/5ML IV SOLN
INTRAVENOUS | Status: AC
  Filled 2024-03-12: qty 5

## 2024-03-13 LAB — NM PET CT CARDIAC PERFUSION MULTI W/ABSOLUTE BLOODFLOW
Nuc Rest EF: 64 %
Nuc Stress EF: 65 %
Rest Nuclear Isotope Dose: 20.3 mCi
ST Depression (mm): 0 mm
Stress Nuclear Isotope Dose: 20 mCi
TID: 1.09

## 2024-03-14 ENCOUNTER — Ambulatory Visit: Payer: Self-pay | Admitting: Nurse Practitioner

## 2024-03-14 DIAGNOSIS — R7989 Other specified abnormal findings of blood chemistry: Secondary | ICD-10-CM | POA: Diagnosis not present

## 2024-03-14 DIAGNOSIS — R7309 Other abnormal glucose: Secondary | ICD-10-CM | POA: Diagnosis not present

## 2024-03-17 LAB — LAB REPORT - SCANNED: A1c: 5.8

## 2024-03-19 ENCOUNTER — Encounter: Payer: Self-pay | Admitting: Nurse Practitioner

## 2024-03-19 ENCOUNTER — Ambulatory Visit: Attending: Nurse Practitioner | Admitting: Nurse Practitioner

## 2024-03-19 VITALS — BP 130/82 | HR 64 | Ht 63.0 in | Wt 172.0 lb

## 2024-03-19 DIAGNOSIS — I2510 Atherosclerotic heart disease of native coronary artery without angina pectoris: Secondary | ICD-10-CM | POA: Diagnosis not present

## 2024-03-19 DIAGNOSIS — I10 Essential (primary) hypertension: Secondary | ICD-10-CM | POA: Diagnosis not present

## 2024-03-19 DIAGNOSIS — E785 Hyperlipidemia, unspecified: Secondary | ICD-10-CM | POA: Diagnosis not present

## 2024-03-19 DIAGNOSIS — I471 Supraventricular tachycardia, unspecified: Secondary | ICD-10-CM | POA: Diagnosis not present

## 2024-03-19 DIAGNOSIS — R002 Palpitations: Secondary | ICD-10-CM

## 2024-03-19 NOTE — Progress Notes (Unsigned)
 Office Visit    Patient Name: Diane Parker Date of Encounter: 03/19/2024  Primary Care Provider:  Bertell Satterfield, MD Primary Cardiologist:  Lynwood Schilling, MD  Chief Complaint    68 year old female  with a history of CAD s/p DES-RCA in 2018, palpitations, hypertension, hyperlipidemia, arthritis, and GERD who presents for follow-up related to CAD.  Past Medical History    Past Medical History:  Diagnosis Date   Acute diverticulitis 03/28/2013   Seen on CT - treated with flagyl  an cipro    Arthritis    shoulders, knees, hips (03/29/2017)   CAD (coronary artery disease), native coronary artery    03/29/17 PCI/DES to the mRCA, EF normal    Essential hypertension    GERD (gastroesophageal reflux disease)    Headache    bad ones lately; maybe q other day (03/29/2017)   High cholesterol    Past Surgical History:  Procedure Laterality Date   ABDOMINAL HYSTERECTOMY     BREAST BIOPSY Left 06/2021   Fibrocystic changes with calcifications   COLONOSCOPY  11/08/2007   MFM:Fjffpoojupnw, rectosigmoid junction/hyperplastic-appearing polypoid    mucosa, 1 of these areas biopsied.  Otherwise, normal rectum/Left-sided transverse diverticula/Diminutive polyp just distal to the ileocecal valve. Hyperplastic and adenomatous polyp.    COLONOSCOPY N/A 06/26/2013   MFM:Rnonwpr diverticulosis. Colonic polyps removed   COLONOSCOPY WITH PROPOFOL  N/A 09/22/2021   sigmoid and descending colon diverticulosis, two 3-4 mm polyps in ascending colon and cecum, one 20 mm polyp in ascending colon completely removed. Tubular adenomas. 2026 surveillance.   CORONARY ANGIOPLASTY WITH STENT PLACEMENT  03/29/2017   CORONARY STENT INTERVENTION N/A 03/29/2017   Procedure: Coronary Stent Intervention;  Surgeon: Court Dorn PARAS, MD;  Location: [facility] INVASIVE CV LAB;  Service: Cardiovascular;  Laterality: N/A;   DILATION AND CURETTAGE OF UTERUS     ESOPHAGOGASTRODUODENOSCOPY  2008   RMR: normal    ESOPHAGOGASTRODUODENOSCOPY (EGD) WITH PROPOFOL  N/A 09/22/2021   small hiatal hernia, otherwise normal   LEFT HEART CATH AND CORONARY ANGIOGRAPHY N/A 03/29/2017   Procedure: Left Heart Cath and Coronary Angiography;  Surgeon: Court Dorn PARAS, MD;  Location: [facility] INVASIVE CV LAB;  Service: Cardiovascular;  Laterality: N/A;   POLYPECTOMY  09/22/2021   Procedure: POLYPECTOMY;  Surgeon: Shaaron Lamar HERO, MD;  Location: [facility] ENDO SUITE;  Service: Endoscopy;;  cecal polyp, ascending colon polyp hot snare    TUBAL LIGATION      Allergies  Allergies  Allergen Reactions   Pollen Extract Other (See Comments)    Sinus symptoms, headache   Rosuvastatin  Other (See Comments)    myalgias   Tape     Band aid-redness and brusing   Penicillins Rash     Labs/Other Studies Reviewed    The following studies were reviewed today:  Cardiac Studies & Procedures   ______________________________________________________________________________________________ CARDIAC CATHETERIZATION  CARDIAC CATHETERIZATION 03/29/2017  Conclusion Images from the original result were not included.   Prox LAD lesion, 40 %stenosed.  Ost 1st Diag lesion, 95 %stenosed.  Mid RCA lesion, 99 %stenosed.  Post intervention, there is a 0% residual stenosis.  A stent was successfully placed.  The left ventricular systolic function is normal.  LV end diastolic pressure is normal.  The left ventricular ejection fraction is 55-65% by visual estimate.  IONIA SCHEY is a 68 y.o. female   [id number] LOCATION:  FACILITY: [facility] PHYSICIAN: Dorn Court, M.D. [birth year]   DATE OF PROCEDURE:  03/29/2017  DATE OF DISCHARGE:     CARDIAC CATHETERIZATION / PCI-  DES RCA    History obtained from chart review.Ms. Schiano is a 68 y.o.female with history of hypertension, hyperlipidemia, and recent palpitations. She was seen by Dr. Charls on 03/19/2017 for additional complaint of exertional chest pain. The patient was  scheduled for a stress Myoview  today. During the Lexus scan infusion she developed chest pain and EKG changes. Because of this the stress test was terminated and the patient was taken to the emergency room where she was stabilized and transferred to [facility] for Cardiac Catheterization to Define Her Anatomy.   PROCEDURE DESCRIPTION:  The patient was brought to the second floor  Cardiac cath lab in the postabsorptive state. She was not premedicated . Her right wristwas prepped and shaved in usual sterile fashion. Xylocaine  1% was used for local anesthesia. A 6 French sheath was inserted into the right radial artery using standard Seldinger technique. The patient received 3500 units  of heparin   intravenously.  A 5 [place] TIG catheter and pigtail catheters were used for selective coronary angiography and left ventriculography respectively. Isovue  dye was used for the entirety of the case. Retrograde aorta, left ventricular and pullback pressures were recorded. The patient received radial cocktail via the SideArm sheath.  The patient received Plavix  600 mg by mouth addition to IV Pepcid  and Angiomax  IV followed by infusion. She had a therapeutic ACT demonstrated. Using a 6 [place] a.l. 0.75 cm guide catheter along with an 014 per Pro water  guidewire and a 2 mm x 12 mm balloon the subtotal mid dominant RCA stenosis was crossed with mild difficulty. Followed following this angiography revealed a widely patent lumen. The wire was then a subbranch. I then stented the disease segment with a 2.25 mm x 16 mm long synergy drug-eluting stent up to 16 atm (2.4 mm) resulting in reduction of a 90% stenosis with TIMI 1 flow to 0% residual with TIMI-3 flow. The patient tolerated the procedure well. The guidewire and catheter were removed and the sheath was removed as well. A TR band was placed on the right wrist to achieve patent hemostasis. The patient left the lab in stable  condition.  Impression Successful PCI and drug-eluting stenting of the subtotal mid dominant RCA stenosis with left-to-right collaterals using a 2.5 mm synergy drug-eluting stent. Patient did have a high grade ostial first diagonal branch stenosis which was a small vessel and can be treated medically. She had normal LV function. I placed her on IV nitroglycerin  because of systemic hypertension. Angiomax  will be continued a focus for 4 hours. The patient will be discharged home in the morning on 2 antiplatelet therapy for 1 year uninterrupted.  Court Carrier. MD, [facility] 03/29/2017 5:08 PM  Findings Coronary Findings Diagnostic  Dominance: Right  Left Anterior Descending  First Diagonal Branch  Right Coronary Artery  Right Posterior Descending Artery Collaterals RPDA filled by collaterals from Mid LAD.  Intervention  Mid RCA lesion Angioplasty A stent was successfully placed. There is a 0% residual stenosis post intervention.   STRESS TESTS  NM PET CT CARDIAC PERFUSION MULTI W/ABSOLUTE BLOODFLOW 03/12/2024  Narrative   Normal perfusion images.  Myocardial blood flow reserve is mildly reduced (1.90), but can be inaccurate in setting of prior coronary stenting.  No high risk findings such as TID or drop in EF with stress.  Overall, study is low risk   LV perfusion is normal. There is no evidence of ischemia. There is no evidence of infarction.   Rest left ventricular function is normal. Rest  EF: 64%. Stress left ventricular function is normal. Stress EF: 65%. End diastolic cavity size is normal. End systolic cavity size is normal.   Myocardial blood flow reserve is not reported in this patient due to technical or patient-specific concerns that affect accuracy.   Coronary calcium  assessment not performed due to prior revascularization.   The study is normal. The study is low risk.   Electronically signed by Lonni Nanas, MD  CLINICAL DATA:  This over-read does not include  interpretation of cardiac or coronary anatomy or pathology. The cardiac PET-CT interpretation by the cardiologist is attached.  COMPARISON:  None Available.  FINDINGS: No suspicious nodules, masses, or infiltrates are identified in the visualized portion of the lungs. No pleural fluid seen.  The visualized portions of the mediastinum and chest wall are unremarkable.  IMPRESSION: No significant non-cardiac abnormality identified.   Electronically Signed By: Norleen DELENA Kil M.D. On: 03/12/2024 12:43   ECHOCARDIOGRAM  ECHOCARDIOGRAM COMPLETE 07/02/2019  Narrative ECHOCARDIOGRAM REPORT    Patient Name:   KAILYN VANDERSLICE Date of Exam: 07/02/2019 Medical Rec #:  [id number]        Height:       63.5 in Accession #:    [id number]       Weight:       178.0 lb Date of Birth:  12-Apr-1956        BSA:          1.85 m Patient Age:    63 years         BP:           140/80 mmHg Patient Gender: F                HR:           75 bpm. Exam Location:  [place]  Procedure: 2D Echo, Cardiac Doppler and Color Doppler  Indications:     I25.118 CAD  History:         Patient has no prior history of Echocardiogram examinations. CAD; Arrythmias:Palpitations Risk Factors:Hypertension, Dyslipidemia and Former Smoker. Cardiac catheterization done 03/29/17 showed an EF of 65%.  Sonographer:     Alan Greenhouse RDMS, RVT, RDCS Referring Phys:  CHARLS BENDER, A Diagnosing Phys: Samuel Mcdowell MD  IMPRESSIONS   1. Left ventricular ejection fraction, by visual estimation, is 60 to 65%. The left ventricle has normal function. Normal left ventricular size. There is borderline left ventricular hypertrophy. 2. Left ventricular diastolic Doppler parameters are consistent with impaired relaxation pattern of LV diastolic filling. 3. Global right ventricle has normal systolic function.The right ventricular size is normal. No increase in right ventricular wall thickness. 4. Left atrial size was normal. 5.  Right atrial size was normal. 6. The mitral valve is grossly normal. Mild mitral valve regurgitation. 7. The tricuspid valve is grossly normal. Tricuspid valve regurgitation is trivial. 8. The aortic valve is tricuspid Aortic valve regurgitation is trivial by color flow Doppler. Mild aortic valve sclerosis without stenosis. 9. The pulmonic valve was grossly normal. Pulmonic valve regurgitation is not visualized by color flow Doppler. 10. TR signal is inadequate for assessing pulmonary artery systolic pressure. 11. The inferior vena cava is normal in size with greater than 50% respiratory variability, suggesting right atrial pressure of 3 mmHg.  In comparison to the previous echocardiogram(s): There are no prior studies on this patient for comparison purposes. FINDINGS Left Ventricle: Left ventricular ejection fraction, by visual estimation, is 60 to 65%. The left ventricle has normal function. There  is borderline left ventricular hypertrophy. Normal left ventricular size. Spectral Doppler shows Left ventricular diastolic Doppler parameters are consistent with impaired relaxation pattern of LV diastolic filling.   LV Wall Scoring: All segments are normal.  Right Ventricle: The right ventricular size is normal. No increase in right ventricular wall thickness. Global RV systolic function is has normal systolic function. The tricuspid regurgitant velocity is 0.98 m/s, and with an assumed right atrial pressure of 3 mmHg, the estimated right ventricular systolic pressure is TR signal is inadequate for assessing PA pressure at 6.8 mmHg.  Left Atrium: Left atrial size was normal in size.  Right Atrium: Right atrial size was normal in size  Pericardium: There is no evidence of pericardial effusion.  Mitral Valve: The mitral valve is grossly normal. There is mild thickening of the mitral valve leaflet(s). Mild mitral valve regurgitation.  Tricuspid Valve: The tricuspid valve is grossly normal.  Tricuspid valve regurgitation is trivial by color flow Doppler.  Aortic Valve: The aortic valve is tricuspid. Aortic valve regurgitation is trivial by color flow Doppler. Mild aortic valve sclerosis is present, with no evidence of aortic valve stenosis.  Pulmonic Valve: The pulmonic valve was grossly normal. Pulmonic valve regurgitation is not visualized by color flow Doppler.  Aorta: The aortic root is normal in size and structure.  Venous: The inferior vena cava is normal in size with greater than 50% respiratory variability, suggesting right atrial pressure of 3 mmHg.  IAS/Shunts: No atrial level shunt detected by color flow Doppler.    LEFT VENTRICLE PLAX 2D LVIDd:         4.85 cm       Diastology LVIDs:         2.83 cm       LV e' lateral:   5.80 cm/s LV PW:         1.18 cm       LV E/e' lateral: 15.5 LV IVS:        0.98 cm       LV e' medial:    5.22 cm/s LVOT diam:     1.90 cm       LV E/e' medial:  17.2 LV SV:         80 ml LV SV Index:   41.34 LVOT Area:     2.84 cm  LV Volumes (MOD) LV area d, A2C:    19.10 cm LV area d, A4C:    23.90 cm LV area s, A2C:    8.57 cm LV area s, A4C:    12.90 cm LV major d, A2C:   7.03 cm LV major d, A4C:   7.48 cm LV major s, A2C:   5.91 cm LV major s, A4C:   6.68 cm LV vol d, MOD A2C: 44.7 ml LV vol d, MOD A4C: 64.4 ml LV vol s, MOD A2C: 11.9 ml LV vol s, MOD A4C: 22.2 ml LV SV MOD A2C:     32.8 ml LV SV MOD A4C:     64.4 ml LV SV MOD BP:      38.1 ml  RIGHT VENTRICLE RV S prime:     12.50 cm/s TAPSE (M-mode): 2.4 cm  LEFT ATRIUM           Index LA diam:      2.50 cm 1.35 cm/m LA Vol (A2C): 56.1 ml 30.31 ml/m LA Vol (A4C): 53.8 ml 29.06 ml/m AORTIC VALVE LVOT Vmax:   126.00 cm/s LVOT Vmean:  84.000 cm/s  LVOT VTI:    0.272 m  AORTA Ao Root diam: 3.50 cm  MITRAL VALVE                         TRICUSPID VALVE MV Area (PHT): 3.54 cm              TR Peak grad:   3.8 mmHg MV PHT:        62.06 msec            TR  Vmax:        97.70 cm/s MV Decel Time: 214 msec MR Peak grad: 72.7 mmHg              SHUNTS MR Vmax:      426.33 cm/s            Systemic VTI:  0.27 m MV E velocity: 89.70 cm/s  103 cm/s  Systemic Diam: 1.90 cm MV A velocity: 107.00 cm/s 70.3 cm/s MV E/A ratio:  0.84        1.5   Jayson Sierras MD Electronically signed by Jayson Sierras MD Signature Date/Time: 07/02/2019/2:27:58 PM    Final    MONITORS  LONG TERM MONITOR (3-14 DAYS) 12/27/2022  Narrative Normal sinus rhythm Rare supraventricular ectopy with the longest run being 7 beats. No sustained arrhythmias.       ______________________________________________________________________________________________     Recent Labs: 12/11/2023: BUN 28; Creatinine, Ser 1.32; Potassium 4.4; Sodium 139  Recent Lipid Panel    Component Value Date/Time   CHOL 148 03/19/2023 1132   TRIG 206 (H) 03/19/2023 1132   HDL 74 03/19/2023 1132   CHOLHDL 2.0 03/19/2023 1132   LDLCALC 42 03/19/2023 1132    History of Present Illness    68 year old female  with the above past medical history including CAD s/p DES-RCA in 2018, palpitations, hypertension, hyperlipidemia, arthritis, and GERD.   Cardiac catheterization in 2018 revealed 99% mid RCA stenosis, 90% stenosis of the ostium of the first diagonal branch. She underwent stenting to her mid RCA.  Echocardiogram in 06/2019 showed EF 60 to 65%, normal LV function, borderline LVH, mild mitral valve regurgitation, mild aortic valve sclerosis without evidence of stenosis. Lexiscan  Myoview  in 2022 in the setting of chest pain was low risk. Cardiac monitor in 12/2022 the setting of palpitations revealed predominantly sinus rhythm, 4 runs of SVT, rare PACs and PVCs.  Carvedilol  was increased to 12.5 mg twice daily.  She was last seen in office on 12/11/2023 and was stable from a cardiac standpoint.  She reported intermittent chest tightness with exertion, relieved with nitroglycerin , increased  dyspnea on exertion.  Cardiac PET stress test in 03/2024 was low risk, no evidence of ischemia or infarction, EF 64%.   She presents today for follow-up. Since her last visit she has been stable from a cardiac standpoint.  She denies symptoms concerning for angina.  She denies any significant palpitations, dyspnea, dizziness, edema or PND, with apnea, weight gain.  She has self-titrated her blood pressure medication. She has been taking diltiazem  180 mg in the morning.  She will take an additional diltiazem  180 mg in the evenings 2 days a week. She has been taking valsartan  120 mg daily in the morning as well as alternating valsartan  80 mg and 40 mg every other day in the evenings.  She is planning to get married and moved in with her fianc in the near future. Overall, she reports feeling well.   Home  Medications    Current Outpatient Medications  Medication Sig Dispense Refill   acetaminophen  (TYLENOL ) 650 MG CR tablet Take 1,300 mg by mouth every 8 (eight) hours as needed for pain.     aspirin  81 MG chewable tablet Chew 1 tablet (81 mg total) by mouth daily. 30 tablet 0   b complex vitamins tablet Take 1 tablet by mouth daily.     Baclofen 5 MG TABS Take 5 mg by mouth at bedtime.     carvedilol  (COREG ) 12.5 MG tablet TAKE 1 TABLET BY MOUTH TWICE  DAILY 180 tablet 2   cetirizine (ZYRTEC) 10 MG tablet Take 10 mg by mouth daily as needed for allergies.     chlorthalidone  (HYGROTON ) 25 MG tablet Take 1 tablet (25 mg total) by mouth daily. 180 tablet 1   Cholecalciferol (VITAMIN D-3) 125 MCG (5000 UT) TABS Take 5,000 Units by mouth daily at 6 (six) AM.     diclofenac sodium (VOLTAREN) 1 % GEL Apply 2 g topically daily as needed (arthritis pain).     diltiazem  (CARDIZEM  LA) 180 MG 24 hr tablet Take 360 mg by mouth daily. Take 1 Tablet Daily. Taking 360 daily     estradiol  (ESTRACE ) 2 MG tablet TAKE 1 TABLET BY MOUTH DAILY 100 tablet 2   Evolocumab  (REPATHA  SURECLICK) 140 MG/ML SOAJ INJECT 140 MG INTO  THE SKIN EVERY 14 DAYS 6 mL 3   fluticasone (FLONASE) 50 MCG/ACT nasal spray Place 2 sprays into both nostrils at bedtime as needed for allergies.     hydrOXYzine (ATARAX) 25 MG tablet Take 25 mg by mouth every 8 (eight) hours as needed.     KLOR-CON  M20 20 MEQ tablet Take 20 mEq by mouth daily.     meloxicam (MOBIC) 7.5 MG tablet Take 7.5 mg by mouth as needed.     Multiple Vitamin (MULTIVITAMIN WITH MINERALS) TABS tablet Take 1 tablet by mouth daily.     nitroGLYCERIN  (NITROSTAT ) 0.4 MG SL tablet Place 1 tablet (0.4 mg total) under the tongue every 5 (five) minutes x 3 doses as needed. 25 tablet 11   Omega-3 Fatty Acids (FISH OIL PO) Take by mouth. 2 at night.     omeprazole (PRILOSEC) 40 MG capsule Take 40 mg by mouth in the morning and at bedtime.     OVER THE COUNTER MEDICATION Pre and probiotic daily     OVER THE COUNTER MEDICATION OTC supplement for brain     sertraline  (ZOLOFT ) 50 MG tablet Take 1 tablet (50 mg total) by mouth daily. (Patient taking differently: Take 25 mg by mouth daily.) 90 tablet 4   traMADol  (ULTRAM ) 50 MG tablet Take 50 mg by mouth 4 (four) times daily as needed.     valsartan  (DIOVAN ) 40 MG tablet TAKE 1 TABLET BY MOUTH TWICE  DAILY ALONG WITH 80 MG TABLET  FOR TOTAL 120 MG TWICE DAILY 180 tablet 3   valsartan  (DIOVAN ) 80 MG tablet TAKE 1 TABLET BY MOUTH TWICE  DAILY ALONG WITH 40 MG TABLET  FOR TOTAL 120 MG 180 tablet 3   No current facility-administered medications for this visit.     Review of Systems    She denies chest pain, palpitations, dyspnea, pnd, orthopnea, n, v, dizziness, syncope, edema, weight gain, or early satiety. All other systems reviewed and are otherwise negative except as noted above.   Physical Exam    VS:  BP 130/82   Pulse 64   Ht 5' 3 (1.6 m)  Wt 172 lb (78 kg)   SpO2 97%   BMI 30.47 kg/m  GEN: Well nourished, well developed, in no acute distress. HEENT: normal. Neck: Supple, no JVD, carotid bruits, or masses. Cardiac: RRR,  no murmurs, rubs, or gallops. No clubbing, cyanosis, edema.  Radials/DP/PT 2+ and equal bilaterally.  Respiratory:  Respirations regular and unlabored, clear to auscultation bilaterally. GI: Soft, nontender, nondistended, BS + x 4. MS: no deformity or atrophy. Skin: warm and dry, no rash. Neuro:  Strength and sensation are intact. Psych: Normal affect.  Accessory Clinical Findings    ECG personally reviewed by me today -    - no EKG in office today.   Lab Results  Component Value Date   WBC 6.5 04/11/2022   HGB 10.8 (L) 04/11/2022   HCT 31.6 (L) 04/11/2022   MCV 89 04/11/2022   PLT 244 04/11/2022   Lab Results  Component Value Date   CREATININE 1.32 (H) 12/11/2023   BUN 28 (H) 12/11/2023   NA 139 12/11/2023   K 4.4 12/11/2023   CL 102 12/11/2023   CO2 20 12/11/2023   Lab Results  Component Value Date   ALT 21 03/29/2017   AST 24 03/29/2017   ALKPHOS 53 03/29/2017   BILITOT 0.5 03/29/2017   Lab Results  Component Value Date   CHOL 148 03/19/2023   HDL 74 03/19/2023   LDLCALC 42 03/19/2023   TRIG 206 (H) 03/19/2023   CHOLHDL 2.0 03/19/2023    No results found for: HGBA1C  Assessment & Plan   1. CAD: S/p DES-RCA in 2018.  Echocardiogram in 06/2019 showed EF 60 to 65%, normal LV function, borderline LVH, mild mitral valve regurgitation, mild aortic valve sclerosis without evidence of stenosis. Lexiscan  Myoview  in 2022 in the setting of chest pain was low risk. Cardiac PET stress test in 03/2024 in the setting of intermittent chest discomfort, increased dyspnea on exertion was low risk, no evidence of ischemia or infarction, EF 64%.  Currently stable with no anginal symptoms. Continue aspirin , carvedilol , valsartan  as below,, chlorthalidone , diltiazem  as below, Repatha .    2. Palpitation/SVT: Cardiac monitor in 12/2022 the setting of palpitations revealed predominantly sinus rhythm, 4 runs of SVT, rare PACs and PVCs.  She reports intermittent fleeting palpitations,  overall stable.  Continue carvedilol , diltiazem .   3. Hypertension: BP well controlled. She has been taking diltiazem  180 mg in the morning.  She states she will take an additional diltiazem  180 mg in the evenings but only 2 days a week. She has been taking valsartan  120 mg daily in the morning as well as alternating valsartan  80 mg and 40 mg every other day in the evenings. Will attempt to simplify her medication regimen and have her take diltiazem  180 mg each morning.  Will have her take valsartan  120 mg daily in the morning and 40 mg in the evening.  Continue to monitor BP and report BP consistently greater than 130/80.  Continue carvedilol , chlorthalidone .  Continue current antihypertensive regimen.    4. Hyperlipidemia:  LDL was 47 in 06/2023. Statin intolerant.  Continue Repatha .   5. Disposition: Follow-up in 6 months, sooner if needed.  She is again requesting to switch cardiologists from Dr. Lavona to Dr. Anner.  I will reach out to both providers to see if they are agreeable to this change.       Damien JAYSON Braver, NP 03/19/2024, 3:12 PM

## 2024-03-19 NOTE — Patient Instructions (Signed)
 Medication Instructions:  Diltiazem  180 mg in the mornings. Valsartan  120 mg in the morning and 40 mg in the evening.   *If you need a refill on your cardiac medications before your next appointment, please call your pharmacy*  Lab Work: NONE ordered at this time of appointment   Testing/Procedures: NONE ordered at this time of appointment   Follow-Up: At [facility], you and your health needs are our priority.  As part of our continuing mission to provide you with exceptional heart care, our providers are all part of one team.  This team includes your primary Cardiologist (physician) and Advanced Practice Providers or APPs (Physician Assistants and Nurse Practitioners) who all work together to provide you with the care you need, when you need it.  Your next appointment:   6 month(s)  Provider:   Damien Braver, NP          We recommend signing up for the patient portal called MyChart.  Sign up information is provided on this After Visit Summary.  MyChart is used to connect with patients for Virtual Visits (Telemedicine).  Patients are able to view lab/test results, encounter notes, upcoming appointments, etc.  Non-urgent messages can be sent to your provider as well.   To learn more about what you can do with MyChart, go to [email or web address].

## 2024-03-21 ENCOUNTER — Encounter: Payer: Self-pay | Admitting: Nurse Practitioner

## 2024-03-27 NOTE — Progress Notes (Signed)
 Spoke with pt. Pt is aware that Dr. Lavona and Dr. Anner are ok with her switching providers to Dr. Anner. Recall was placed for Dr. Anner in 6 month d/f/u.

## 2024-04-18 ENCOUNTER — Other Ambulatory Visit (HOSPITAL_COMMUNITY): Payer: Self-pay | Admitting: Internal Medicine

## 2024-04-18 DIAGNOSIS — Z1231 Encounter for screening mammogram for malignant neoplasm of breast: Secondary | ICD-10-CM

## 2024-04-22 ENCOUNTER — Ambulatory Visit (INDEPENDENT_AMBULATORY_CARE_PROVIDER_SITE_OTHER): Admitting: "Endocrinology

## 2024-04-22 ENCOUNTER — Encounter: Payer: Self-pay | Admitting: "Endocrinology

## 2024-04-22 VITALS — BP 128/76 | HR 64 | Ht 63.0 in | Wt 168.2 lb

## 2024-04-22 DIAGNOSIS — E270 Other adrenocortical overactivity: Secondary | ICD-10-CM | POA: Insufficient documentation

## 2024-04-22 DIAGNOSIS — E782 Mixed hyperlipidemia: Secondary | ICD-10-CM | POA: Diagnosis not present

## 2024-04-22 DIAGNOSIS — R7303 Prediabetes: Secondary | ICD-10-CM | POA: Insufficient documentation

## 2024-04-22 NOTE — Progress Notes (Signed)
 Endocrinology Consult Note                                            04/22/2024, 12:08 PM   Subjective:    Patient ID: Diane Parker, female    DOB: 06-Mar-1956, PCP Bertell Satterfield, MD   Past Medical History:  Diagnosis Date   Acute diverticulitis 03/28/2013   Seen on CT - treated with flagyl  an cipro    Arthritis    shoulders, knees, hips (03/29/2017)   CAD (coronary artery disease), native coronary artery    03/29/17 PCI/DES to the mRCA, EF normal    Essential hypertension    GERD (gastroesophageal reflux disease)    Headache    bad ones lately; maybe q other day (03/29/2017)   High cholesterol    Past Surgical History:  Procedure Laterality Date   ABDOMINAL HYSTERECTOMY     BREAST BIOPSY Left 06/2021   Fibrocystic changes with calcifications   COLONOSCOPY  11/08/2007   MFM:Fjffpoojupnw, rectosigmoid junction/hyperplastic-appearing polypoid    mucosa, 1 of these areas biopsied.  Otherwise, normal rectum/Left-sided transverse diverticula/Diminutive polyp just distal to the ileocecal valve. Hyperplastic and adenomatous polyp.    COLONOSCOPY N/A 06/26/2013   MFM:Rnonwpr diverticulosis. Colonic polyps removed   COLONOSCOPY WITH PROPOFOL  N/A 09/22/2021   sigmoid and descending colon diverticulosis, two 3-4 mm polyps in ascending colon and cecum, one 20 mm polyp in ascending colon completely removed. Tubular adenomas. 2026 surveillance.   CORONARY ANGIOPLASTY WITH STENT PLACEMENT  03/29/2017   CORONARY STENT INTERVENTION N/A 03/29/2017   Procedure: Coronary Stent Intervention;  Surgeon: Court Dorn PARAS, MD;  Location: [facility] INVASIVE CV LAB;  Service: Cardiovascular;  Laterality: N/A;   DILATION AND CURETTAGE OF UTERUS     ESOPHAGOGASTRODUODENOSCOPY  2008   RMR: normal   ESOPHAGOGASTRODUODENOSCOPY (EGD) WITH PROPOFOL  N/A 09/22/2021   small hiatal hernia, otherwise normal   LEFT HEART CATH AND CORONARY ANGIOGRAPHY N/A 03/29/2017   Procedure: Left Heart Cath and  Coronary Angiography;  Surgeon: Court Dorn PARAS, MD;  Location: [facility] INVASIVE CV LAB;  Service: Cardiovascular;  Laterality: N/A;   POLYPECTOMY  09/22/2021   Procedure: POLYPECTOMY;  Surgeon: Shaaron Lamar HERO, MD;  Location: [facility] SU[facility];  Service: Endoscopy;;  cecal polyp, ascending colon polyp hot snare    TUBAL LIGATION     Social History   Socioeconomic History   Marital status: Single    Spouse name: Not on file   Number of children: Not on file   Years of education: Not on file   Highest education level: Not on file  Occupational History   Not on file  Tobacco Use   Smoking status: Former    Current packs/day: 0.00    Average packs/day: 0.1 packs/day for 3.0 years (0.4 ttl pk-yrs)    Types: Cigarettes    Start date: 5    Quit date: 38    Years since quitting: 35.6    Passive exposure: Current   Smokeless tobacco: Never  Vaping Use   Vaping status: Never Used  Substance and Sexual Activity   Alcohol use: Yes    Alcohol/week: 6.0 standard drinks of alcohol    Types: 6 Cans of beer per week   Drug use: Not Currently    Types: Marijuana    Comment: very seldom   Sexual activity: Not Currently  Birth control/protection: Surgical    Comment: tubal/hyst  Other Topics Concern   Not on file  Social History Narrative   Not on file   Social Drivers of Health   Financial Resource Strain: Low Risk  (06/06/2022)   Overall Financial Resource Strain (CARDIA)    Difficulty of Paying Living Expenses: Not hard at all  Food Insecurity: No Food Insecurity (06/06/2022)   Hunger Vital Sign    Worried About Running Out of Food in the Last Year: Never true    Ran Out of Food in the Last Year: Never true  Transportation Needs: No Transportation Needs (06/06/2022)   PRAPARE - Administrator, Civil Service (Medical): No    Lack of Transportation (Non-Medical): No  Physical Activity: Sufficiently Active (06/06/2022)   Exercise Vital Sign    Days of Exercise per Week:  7 days    Minutes of Exercise per Session: 60 min  Stress: No Stress Concern Present (06/06/2022)   [employer] of Occupational Health - Occupational Stress Questionnaire    Feeling of Stress : Only a little  Social Connections: Socially Integrated (06/06/2022)   Social Connection and Isolation Panel    Frequency of Communication with Friends and Family: More than three times a week    Frequency of Social Gatherings with Friends and Family: More than three times a week    Attends Religious Services: 1 to 4 times per year    Active Member of [employer] or Organizations: Yes    Attends Banker Meetings: 1 to 4 times per year    Marital Status: Married   Family History  Problem Relation Age of Onset   Heart disease Father    Aneurysm Mother    Cancer Cousin        breast   Other Daughter        PTSD   Colon cancer Neg Hx    Outpatient Encounter Medications as of 04/22/2024  Medication Sig   OVER THE COUNTER MEDICATION daily. (Patient taking differently: daily. Nutrifil daily)   valsartan  (DIOVAN ) 40 MG tablet TAKE 1 TABLET BY MOUTH TWICE  DAILY ALONG WITH 80 MG TABLET  FOR TOTAL 120 MG TWICE DAILY (Patient taking differently: TAKE 1 TABLET BY MOUTH TWICE  DAILY ALONG WITH 80 MG TABLET  FOR TOTAL 120 MG TWICE DAILY)   acetaminophen  (TYLENOL ) 650 MG CR tablet Take 1,300 mg by mouth every 8 (eight) hours as needed for pain. (Patient not taking: Reported on 04/22/2024)   aspirin  81 MG chewable tablet Chew 1 tablet (81 mg total) by mouth daily.   b complex vitamins tablet Take 1 tablet by mouth daily.   Baclofen 5 MG TABS Take 5 mg by mouth at bedtime.   carvedilol  (COREG ) 12.5 MG tablet TAKE 1 TABLET BY MOUTH TWICE  DAILY   cetirizine (ZYRTEC) 10 MG tablet Take 10 mg by mouth daily as needed for allergies.   chlorthalidone  (HYGROTON ) 25 MG tablet Take 1 tablet (25 mg total) by mouth daily.   Cholecalciferol (VITAMIN D-3) 125 MCG (5000 UT) TABS Take 5,000 Units by mouth daily at  6 (six) AM.   diclofenac sodium (VOLTAREN) 1 % GEL Apply 2 g topically daily as needed (arthritis pain).   diltiazem  (CARDIZEM  LA) 180 MG 24 hr tablet Take 360 mg by mouth daily. Take 1 Tablet Daily. Taking 360 daily (Patient taking differently: Take 180 mg by mouth every morning.)   estradiol  (ESTRACE ) 2 MG tablet TAKE 1 TABLET BY  MOUTH DAILY   Evolocumab  (REPATHA  SURECLICK) 140 MG/ML SOAJ INJECT 140 MG INTO THE SKIN EVERY 14 DAYS   fluticasone (FLONASE) 50 MCG/ACT nasal spray Place 2 sprays into both nostrils at bedtime as needed for allergies.   hydrOXYzine (ATARAX) 25 MG tablet Take 25 mg by mouth every 8 (eight) hours as needed.   KLOR-CON  M20 20 MEQ tablet Take 20 mEq by mouth daily.   meloxicam (MOBIC) 7.5 MG tablet Take 7.5 mg by mouth as needed. (Patient not taking: Reported on 04/22/2024)   Multiple Vitamin (MULTIVITAMIN WITH MINERALS) TABS tablet Take 1 tablet by mouth daily.   nitroGLYCERIN  (NITROSTAT ) 0.4 MG SL tablet Place 1 tablet (0.4 mg total) under the tongue every 5 (five) minutes x 3 doses as needed.   Omega-3 Fatty Acids (FISH OIL PO) Take by mouth. 2 at night. (Patient not taking: Reported on 04/22/2024)   omeprazole (PRILOSEC) 40 MG capsule Take 40 mg by mouth in the morning and at bedtime.   OVER THE COUNTER MEDICATION Pre and probiotic daily (Patient not taking: Reported on 04/22/2024)   OVER THE COUNTER MEDICATION OTC supplement for brain   sertraline  (ZOLOFT ) 50 MG tablet Take 1 tablet (50 mg total) by mouth daily. (Patient taking differently: Take 25 mg by mouth daily.)   traMADol  (ULTRAM ) 50 MG tablet Take 50 mg by mouth 4 (four) times daily as needed.   valsartan  (DIOVAN ) 80 MG tablet TAKE 1 TABLET BY MOUTH TWICE  DAILY ALONG WITH 40 MG TABLET  FOR TOTAL 120 MG   No facility-administered encounter medications on file as of 04/22/2024.   ALLERGIES: Allergies  Allergen Reactions   Pollen Extract Other (See Comments)    Sinus symptoms, headache   Rosuvastatin  Other  (See Comments)    myalgias   Tape     Band aid-redness and brusing   Penicillins Rash    VACCINATION STATUS: Immunization History  Administered Date(s) Administered   Influenza,inj,Quad PF,6+ Mos 07/08/2019   Influenza-Unspecified 06/05/2015   Moderna Sars-Covid-2 Vaccination 11/07/2019, 12/09/2019    HPI Owen ROHINI JAROSZEWSKI is 68 y.o. female who presents today with a medical history as above. she is being seen in consultation for hypercortisolemia requested by Bertell Satterfield, MD. History is obtained directly from the patient as well as chart review.  Patient denies any prior history of adrenal, thyroid , pituitary dysfunctions. Due to diffuse arthralgias, she has exposure to steroids mainly intra-articular injections as well as inhaled steroid preparations .  She is a former smoker.  She also gives history of unintended weight loss recently. At one of her random lab work in October 2024 she was found to have random cortisol of 35.4.  This was repeated more recently in July 11,2025 at which time her a.m. cortisol was high at 54 and p.m. cortisol is high at 18.8.  She could not pinpoint the exact exposure of steroids to those labs at the moment.  However, she is almost sure that she was on steroids in July just before the lab work.  Associated ACTH with the most recent cortisol measurement was normal at 47.4 on March 14, 2024. Patient denies weight gain, easy bruising, body weight maldistribution.  She has prediabetes with a recent A1c of 5.8%, not on treatment.  She is known to have severe dyslipidemia for which she is on Repatha  140 mg subcutaneously every 2 weeks. March 03, 2024 labs show 24-hour urine 5 HIAA was normal at 4.1.  Not exactly sure why this was measured.  Patient  denies any wheezing, diarrhea, nausea, nor skin rash.  She does not have acute complaints today, denies any headaches.  She does not have any prior history of coronary artery disease nor CVA.  Review of  Systems  Constitutional: +mildly fluctuating body weight, no fatigue, no subjective hyperthermia, no subjective hypothermia Eyes: no blurry vision, no xerophthalmia ENT: no sore throat, no nodules palpated in throat, no dysphagia/odynophagia, no hoarseness Cardiovascular: no Chest Pain, no Shortness of Breath, no palpitations, no leg swelling Respiratory: no cough, no shortness of breath Gastrointestinal: no Nausea/Vomiting/Diarhhea Musculoskeletal: no muscle/joint aches Skin: no rashes Neurological: no tremors, no numbness, no tingling, no dizziness Psychiatric: no depression, no anxiety  Objective:       04/22/2024    9:30 AM 03/19/2024    3:08 PM 03/12/2024    9:47 AM  Vitals with BMI  Height 5' 3 5' 3   Weight 168 lbs 3 oz 172 lbs   BMI 29.8 30.48   Systolic 128 130 834  Diastolic 76 82 77  Pulse 64 64 67    BP 128/76   Pulse 64   Ht 5' 3 (1.6 m)   Wt 168 lb 3.2 oz (76.3 kg)   BMI 29.80 kg/m   Wt Readings from Last 3 Encounters:  04/22/24 168 lb 3.2 oz (76.3 kg)  03/19/24 172 lb (78 kg)  01/02/24 171 lb 4.8 oz (77.7 kg)    Physical Exam  Constitutional:  Body mass index is 29.8 kg/m.,  not in acute distress, normal state of mind Eyes: PERRLA, EOMI, no exophthalmos ENT: moist mucous membranes, no gross thyromegaly, no gross cervical lymphadenopathy Cardiovascular: normal precordial activity, Regular Rate and Rhythm, no Murmur/Rubs/Gallops Respiratory:  adequate breathing efforts, no gross chest deformity, Clear to auscultation bilaterally Gastrointestinal: abdomen soft, Non -tender, No distension, Bowel Sounds present, no gross organomegaly Musculoskeletal: no gross deformities, strength intact in all four extremities, no peripheral edema Skin: moist, warm, no rashes Neurological: no tremor with outstretched hands, Deep tendon reflexes normal in bilateral lower extremities.  CMP ( most recent) CMP     Component Value Date/Time   NA 139 12/11/2023 1430   K  4.4 12/11/2023 1430   CL 102 12/11/2023 1430   CO2 20 12/11/2023 1430   GLUCOSE 103 (H) 12/11/2023 1430   GLUCOSE 101 (H) 09/20/2021 1200   BUN 28 (H) 12/11/2023 1430   CREATININE 1.32 (H) 12/11/2023 1430   CREATININE 0.99 03/26/2013 1538   CALCIUM  9.8 12/11/2023 1430   PROT 7.5 03/29/2017 1100   ALBUMIN 4.1 03/29/2017 1100   AST 24 03/29/2017 1100   ALT 21 03/29/2017 1100   ALKPHOS 53 03/29/2017 1100   BILITOT 0.5 03/29/2017 1100   EGFR 44 (L) 12/11/2023 1430   GFRNONAA >60 09/20/2021 1200      Lipid Panel ( most recent) Lipid Panel     Component Value Date/Time   CHOL 148 03/19/2023 1132   TRIG 206 (H) 03/19/2023 1132   HDL 74 03/19/2023 1132   CHOLHDL 2.0 03/19/2023 1132   LDLCALC 42 03/19/2023 1132   LABVLDL 32 03/19/2023 1132      Lab Results  Component Value Date   TSH 2.300 05/02/2021     March 14, 2024 labs: A.m. cortisol 54.0, p.m. cortisol 18.8-both are elevated ACTH was 47.4 inappropriately normal, A1c 5.8% June 22, 2023 random cortisol 35.4, TSH 1.87, 25-hydroxy vitamin D 80.5 February 27, 2024 labs: Total cholesterol 156, triglycerides 87, HDL 74, LDL 66 March 03, 2024 labs: 24-hour  urine 5 HIAA 4.1 mcg per 24 hours BUN 16, creatinine 1.06  Assessment & Plan:   1. Hypercortisolemia (HCC)  2.  Prediabetes   3.  Hyperlipidemia  - DRUCILLA CUMBER  is being seen at a kind request of Bertell Satterfield, MD. - I have reviewed her available  records and clinically evaluated the patient. - Based on these reviews, she has metabolic syndrome, hypocortisolemia, prediabetes. Her clinical presentation is unrevealing for Cushing syndrome nor carcinoid syndrome.  The circumstance of her lab work is confounded by her exposure to various steroid preparations.  She did not have any further exposure to steroids in the last 3 weeks.  After 2 weeks, she will have repeat 24-hour urine free cortisol and midnight salivary cortisol level measurement. I had a long discussion  with her that confirming Cushing syndrome will need several unequivocal truly positive test results.  If she returns with inconclusive lab results, she will be considered for repeat labs including with 1 mg overnight dexamethasone  suppression test.  If she is biochemically confirmed to have Cushing's syndrome, she will be considered for further studies to locate source of hypercortisolemia   - Cortisol, urine, free; Future - Creatinine, urine, 24 hour; Future - Salivary Cortisol - Creatinine, urine, 24 hour - Cortisol, urine, free - She has prediabetes, and dyslipidemia requiring Repatha  intervention, she will benefit from weight loss, whole food plant-based diet was discussed with her. She is hesitant to consider plant-based diet for now. She is advised to stay away from smoking.  - she is advised to maintain close follow up with Bertell Satterfield, MD for primary care needs.   -Thank you for involving me in the care of this pleasant patient.  Time spent with the patient: 46  minutes spent in  counseling her about hypocortisolemia, hyperlipidemia and the rest in obtaining information about her symptoms, reviewing her previous labs/studies (including abstractions from other facilities),  evaluations, and treatments,  and developing a plan to confirm diagnosis and long term treatment based on the latest standards of care/guidelines; and documenting her care.  Diane Parker participated in the discussions, expressed understanding, and voiced agreement with the above plans.  All questions were answered to her satisfaction. she is encouraged to contact clinic should she have any questions or concerns prior to her return visit.  Follow up plan: Return in about 4 weeks (around 05/20/2024) for F/U with Pre-visit Labs, 24 Hr Urine Free Cortisol & Cr.   Ranny Earl, MD [facility] [address] [place], [place] [address] Phone: [phone]  Fax:  [phone]     04/22/2024, 12:08 PM  This note was partially dictated with voice recognition software. Similar sounding words can be transcribed inadequately or may not  be corrected upon review.

## 2024-05-02 ENCOUNTER — Ambulatory Visit (HOSPITAL_COMMUNITY)
Admission: RE | Admit: 2024-05-02 | Discharge: 2024-05-02 | Disposition: A | Discharge status: Home / Self Care | Source: Ambulatory Visit | Attending: Internal Medicine | Admitting: Internal Medicine

## 2024-05-02 DIAGNOSIS — Z1231 Encounter for screening mammogram for malignant neoplasm of breast: Secondary | ICD-10-CM | POA: Insufficient documentation

## 2024-05-12 ENCOUNTER — Other Ambulatory Visit (HOSPITAL_COMMUNITY)
Admission: RE | Admit: 2024-05-12 | Discharge: 2024-05-12 | Disposition: A | Discharge status: Home / Self Care | Source: Ambulatory Visit | Attending: Adult Health | Admitting: Adult Health

## 2024-05-12 ENCOUNTER — Ambulatory Visit: Admitting: Adult Health

## 2024-05-12 ENCOUNTER — Encounter: Payer: Self-pay | Admitting: Adult Health

## 2024-05-12 VITALS — BP 178/84 | HR 54 | Ht 63.0 in | Wt 171.5 lb

## 2024-05-12 DIAGNOSIS — F419 Anxiety disorder, unspecified: Secondary | ICD-10-CM

## 2024-05-12 DIAGNOSIS — N898 Other specified noninflammatory disorders of vagina: Secondary | ICD-10-CM | POA: Diagnosis not present

## 2024-05-12 DIAGNOSIS — Z1211 Encounter for screening for malignant neoplasm of colon: Secondary | ICD-10-CM

## 2024-05-12 DIAGNOSIS — Z79818 Long term (current) use of other agents affecting estrogen receptors and estrogen levels: Secondary | ICD-10-CM

## 2024-05-12 DIAGNOSIS — R232 Flushing: Secondary | ICD-10-CM | POA: Diagnosis not present

## 2024-05-12 DIAGNOSIS — Z90710 Acquired absence of both cervix and uterus: Secondary | ICD-10-CM | POA: Diagnosis not present

## 2024-05-12 DIAGNOSIS — I10 Essential (primary) hypertension: Secondary | ICD-10-CM

## 2024-05-12 DIAGNOSIS — Z01419 Encounter for gynecological examination (general) (routine) without abnormal findings: Secondary | ICD-10-CM | POA: Diagnosis not present

## 2024-05-12 LAB — POCT WET PREP (WET MOUNT)
Clue Cells Wet Prep Whiff POC: NEGATIVE
Trichomonas Wet Prep HPF POC: ABSENT
WBC, Wet Prep HPF POC: POSITIVE

## 2024-05-12 LAB — HEMOCCULT GUIAC POC 1CARD (OFFICE): Fecal Occult Blood, POC: NEGATIVE

## 2024-05-12 NOTE — Progress Notes (Signed)
 Patient ID: Diane Parker, female   DOB: [birth year], 69 y.o.   MRN: [id number] History of Present Illness: Diane Parker is a 68 year old black female,single, sp hysterectomy in for a well woman gyn exam. She is on estrace  2 mg 1 daily, and when does not take will have hot flash.  PCP is Dr Bertell   Current Medications, Allergies, Past Medical History, Past Surgical History, Family History and Social History were reviewed in [employer] electronic medical record.     Review of Systems: Patient denies any headaches, hearing loss, fatigue, blurred vision, shortness of breath, chest pain, abdominal pain, problems with bowel movements, urination, or intercourse(had sex last about 2-3 months ago). No joint pain or mood swings. Not sleeping well right now See HPI for positives    Physical Exam:BP (!) 178/84 (BP Location: Right Arm, Patient Position: Sitting, Cuff Size: Normal)   Pulse (!) 54   Ht 5' 3 (1.6 m)   Wt 171 lb 8 oz (77.8 kg)   BMI 30.38 kg/m   General:  Well developed, well nourished, no acute distress Skin:  Warm and dry Neck:  Midline trachea, normal thyroid , good ROM, no lymphadenopathy,no carotid bruits heard  Lungs; Clear to auscultation bilaterally Breast:  No dominant palpable mass, retraction, or nipple discharge Cardiovascular: Regular rate and rhythm Abdomen:  Soft, non tender, no hepatosplenomegaly Pelvic:  External genitalia is normal in appearance, no lesions.  The vagina has creamy discharge, no odor.CV swab obtained. Urethra has no lesions or masses. The cervix and uterus are absent. No adnexal masses or tenderness noted.Bladder is non tender, no masses felt. Wet prep, was +WBC. Rectal: Good sphincter tone, no polyps, or hemorrhoids felt.  Hemoccult negative. Extremities/musculoskeletal:  No swelling or varicosities noted, no clubbing or cyanosis Psych:  No mood changes, alert and cooperative,seems happy AA is 5 Fall risk is low    05/12/2024   10:14 AM 06/06/2022    11:08 AM 04/22/2021    8:56 AM  Depression screen PHQ 2/9  Decreased Interest 0 0 0  Down, Depressed, Hopeless 0 0 0  PHQ - 2 Score 0 0 0  Altered sleeping 3 3 3   Tired, decreased energy 1 0 1  Change in appetite 0 0 0  Feeling bad or failure about yourself  0 0 0  Trouble concentrating 0 0 0  Moving slowly or fidgety/restless 0 0 0  Suicidal thoughts 0 0 0  PHQ-9 Score 4 3 4        05/12/2024   10:14 AM 06/06/2022   11:08 AM 04/22/2021    8:56 AM 04/21/2020   10:37 AM  GAD 7 : Generalized Anxiety Score  Nervous, Anxious, on Edge 0 0 1 0  Control/stop worrying 0 0 0 0  Worry too much - different things 0 0 0 0  Trouble relaxing 2 1 1  0  Restless 0 0 0 0  Easily annoyed or irritable 2 1 0 0  Afraid - awful might happen 0 0 0 0  Total GAD 7 Score 4 2 2  0      Upstream - 05/12/24 1007       Pregnancy Intention Screening   Does the patient want to become pregnant in the next year? No    Does the patient's partner want to become pregnant in the next year? No    Would the patient like to discuss contraceptive options today? No      Contraception Wrap Up   Current Method Female  Sterilization   hyst   End Method Female Sterilization   hyst   Contraception Counseling Provided No          Examination chaperoned by Diane Salt LPN  Impression and Plan: 1. Encounter for well woman exam with routine gynecological exam (Primary) Physical in 1 year Labs with PCP Mammogram was negative 05/02/24 Colonoscopy per GI   2. S/P hysterectomy   3. Current use of estrogen therapy Happy with estrace  2 mg 1 daily, she is aware of risks and wants to continue, she has refills    4. Encounter for screening fecal occult blood testing Hemoccult was negative  - POCT occult blood stool  5. Anxiety She is on zoloft  50 mg and doing well, she said doesn't need refills yet  6. Hot flashes Hot flashes good on estrace  but returns when she is off  7. Essential hypertension She is taking  BP meds she says, follow up with PCP  8. Vaginal discharge Wet prep +WBC CV swab sent for GC/CHL,trich,BV and yeast  - POCT Wet Prep ([place]) - Cervicovaginal ancillary only( [place])

## 2024-05-13 ENCOUNTER — Ambulatory Visit: Payer: Self-pay | Admitting: Adult Health

## 2024-05-13 LAB — CERVICOVAGINAL ANCILLARY ONLY
Bacterial Vaginitis (gardnerella): NEGATIVE
Candida Glabrata: NEGATIVE
Candida Vaginitis: POSITIVE — AB
Chlamydia: NEGATIVE
Comment: NEGATIVE
Comment: NEGATIVE
Comment: NEGATIVE
Comment: NEGATIVE
Comment: NEGATIVE
Comment: NORMAL
Neisseria Gonorrhea: NEGATIVE
Trichomonas: NEGATIVE

## 2024-05-13 MED ORDER — FLUCONAZOLE 150 MG PO TABS: ORAL_TABLET | ORAL | 1 refills | Status: AC

## 2024-05-20 LAB — CREATININE, URINE, 24 HOUR
Creatinine, 24H Ur: 1405 mg/(24.h) (ref 800–1800)
Creatinine, Urine: 55.1 mg/dL

## 2024-05-22 ENCOUNTER — Ambulatory Visit: Admitting: "Endocrinology

## 2024-05-22 ENCOUNTER — Telehealth: Payer: Self-pay | Admitting: "Endocrinology

## 2024-05-22 NOTE — Telephone Encounter (Signed)
 This pt is confused about what she is needing and how she needs to do it since she's already done the 24 hour creatine.  She states the lab can use the same jug for the cortisol and wanted me to ask Dr. Lenis if that's okay.  I informed pt that we do not know how the lab draws those labs or what they can and can not use, but she insisted for me to ask Dr. Lenis.  Please call pt to advise what she needs to do to get these last 2 labs done.

## 2024-05-22 NOTE — Telephone Encounter (Signed)
 Pt is stating that lab can use same jug for the cortisol urine and she stated she's done the salivary.  Is this okay or does she need a new jug for the coristol?

## 2024-05-22 NOTE — Telephone Encounter (Signed)
 Spoke with [facility] representative, she stated they had the specimens they need for the both test and they are still in process. States they take up to at least 7 days for one test. Pt made aware.

## 2024-05-23 LAB — CORTISOL, URINE, FREE
Cortisol (Ur), Free: 8 ug/(24.h) (ref 6–42)
Cortisol,F,ug/L,U: 3 ug/L

## 2024-05-29 LAB — CORTISOL, SALIVARY: Salivary Cortisol, MS: 0.235 ug/dL

## 2024-06-02 ENCOUNTER — Ambulatory Visit (INDEPENDENT_AMBULATORY_CARE_PROVIDER_SITE_OTHER): Admitting: "Endocrinology

## 2024-06-02 ENCOUNTER — Encounter: Payer: Self-pay | Admitting: "Endocrinology

## 2024-06-02 VITALS — BP 134/78 | HR 64 | Ht 63.0 in | Wt 172.4 lb

## 2024-06-02 DIAGNOSIS — E782 Mixed hyperlipidemia: Secondary | ICD-10-CM

## 2024-06-02 DIAGNOSIS — E270 Other adrenocortical overactivity: Secondary | ICD-10-CM | POA: Diagnosis not present

## 2024-06-02 DIAGNOSIS — R7303 Prediabetes: Secondary | ICD-10-CM

## 2024-06-02 NOTE — Progress Notes (Signed)
 06/02/2024, 12:09 PM   Endocrinology follow-up note  Subjective:    Patient ID: Diane Parker, female    DOB: 06-17-1956, PCP Bertell Satterfield, MD   Past Medical History:  Diagnosis Date   Acute diverticulitis 03/28/2013   Seen on CT - treated with flagyl  an cipro    Arthritis    shoulders, knees, hips (03/29/2017)   CAD (coronary artery disease), native coronary artery    03/29/17 PCI/DES to the mRCA, EF normal    Essential hypertension    GERD (gastroesophageal reflux disease)    Headache    bad ones lately; maybe q other day (03/29/2017)   High cholesterol    Past Surgical History:  Procedure Laterality Date   ABDOMINAL HYSTERECTOMY     BREAST BIOPSY Left 06/2021   Fibrocystic changes with calcifications   COLONOSCOPY  11/08/2007   MFM:Fjffpoojupnw, rectosigmoid junction/hyperplastic-appearing polypoid    mucosa, 1 of these areas biopsied.  Otherwise, normal rectum/Left-sided transverse diverticula/Diminutive polyp just distal to the ileocecal valve. Hyperplastic and adenomatous polyp.    COLONOSCOPY N/A 06/26/2013   MFM:Rnonwpr diverticulosis. Colonic polyps removed   COLONOSCOPY WITH PROPOFOL  N/A 09/22/2021   sigmoid and descending colon diverticulosis, two 3-4 mm polyps in ascending colon and cecum, one 20 mm polyp in ascending colon completely removed. Tubular adenomas. 2026 surveillance.   CORONARY ANGIOPLASTY WITH STENT PLACEMENT  03/29/2017   CORONARY STENT INTERVENTION N/A 03/29/2017   Procedure: Coronary Stent Intervention;  Surgeon: Court Dorn PARAS, MD;  Location: [facility] INVASIVE CV LAB;  Service: Cardiovascular;  Laterality: N/A;   DILATION AND CURETTAGE OF UTERUS     ESOPHAGOGASTRODUODENOSCOPY  2008   RMR: normal   ESOPHAGOGASTRODUODENOSCOPY (EGD) WITH PROPOFOL  N/A 09/22/2021   small hiatal hernia, otherwise normal   LEFT HEART CATH AND CORONARY ANGIOGRAPHY N/A 03/29/2017   Procedure: Left Heart Cath and  Coronary Angiography;  Surgeon: Court Dorn PARAS, MD;  Location: [facility] INVASIVE CV LAB;  Service: Cardiovascular;  Laterality: N/A;   POLYPECTOMY  09/22/2021   Procedure: POLYPECTOMY;  Surgeon: Shaaron Lamar HERO, MD;  Location: [facility] SU[facility];  Service: Endoscopy;;  cecal polyp, ascending colon polyp hot snare    TUBAL LIGATION     Social History   Socioeconomic History   Marital status: Single    Spouse name: Not on file   Number of children: Not on file   Years of education: Not on file   Highest education level: Not on file  Occupational History   Not on file  Tobacco Use   Smoking status: Former    Current packs/day: 0.00    Average packs/day: 0.1 packs/day for 3.0 years (0.4 ttl pk-yrs)    Types: Cigarettes    Start date: 9    Quit date: 52    Years since quitting: 35.7    Passive exposure: Current   Smokeless tobacco: Never  Vaping Use   Vaping status: Never Used  Substance and Sexual Activity   Alcohol use: Yes    Alcohol/week: 6.0 standard drinks of alcohol    Types: 6 Cans of beer per week   Drug use: Yes    Types: Marijuana    Comment: very seldom   Sexual activity: Not Currently  Birth control/protection: Surgical    Comment: tubal/hyst  Other Topics Concern   Not on file  Social History Narrative   Not on file   Social Drivers of Health   Financial Resource Strain: Low Risk  (05/12/2024)   Overall Financial Resource Strain (CARDIA)    Difficulty of Paying Living Expenses: Not hard at all  Food Insecurity: No Food Insecurity (05/12/2024)   Hunger Vital Sign    Worried About Running Out of Food in the Last Year: Never true    Ran Out of Food in the Last Year: Never true  Transportation Needs: No Transportation Needs (05/12/2024)   PRAPARE - Administrator, Civil Service (Medical): No    Lack of Transportation (Non-Medical): No  Physical Activity: Sufficiently Active (05/12/2024)   Exercise Vital Sign    Days of Exercise per Week: 7 days     Minutes of Exercise per Session: 50 min  Stress: Stress Concern Present (05/12/2024)   [employer] of Occupational Health - Occupational Stress Questionnaire    Feeling of Stress: To some extent  Social Connections: Socially Integrated (05/12/2024)   Social Connection and Isolation Panel    Frequency of Communication with Friends and Family: More than three times a week    Frequency of Social Gatherings with Friends and Family: More than three times a week    Attends Religious Services: 1 to 4 times per year    Active Member of [employer] or Organizations: Yes    Attends Banker Meetings: 1 to 4 times per year    Marital Status: Living with partner   Family History  Problem Relation Age of Onset   Heart disease Father    Aneurysm Mother    Cancer Cousin        breast   Other Daughter        PTSD   Colon cancer Neg Hx    Outpatient Encounter Medications as of 06/02/2024  Medication Sig   aspirin  81 MG chewable tablet Chew 1 tablet (81 mg total) by mouth daily.   b complex vitamins tablet Take 1 tablet by mouth daily.   Baclofen 5 MG TABS Take 5 mg by mouth at bedtime.   carvedilol  (COREG ) 12.5 MG tablet TAKE 1 TABLET BY MOUTH TWICE  DAILY   cetirizine (ZYRTEC) 10 MG tablet Take 10 mg by mouth daily as needed for allergies.   chlorthalidone  (HYGROTON ) 25 MG tablet Take 1 tablet (25 mg total) by mouth daily.   Cholecalciferol (VITAMIN D-3) 125 MCG (5000 UT) TABS Take 5,000 Units by mouth daily at 6 (six) AM.   diclofenac sodium (VOLTAREN) 1 % GEL Apply 2 g topically daily as needed (arthritis pain).   diltiazem  (CARDIZEM  LA) 180 MG 24 hr tablet Take 360 mg by mouth daily. Take 1 Tablet Daily. Taking 360 daily   estradiol  (ESTRACE ) 2 MG tablet TAKE 1 TABLET BY MOUTH DAILY   Evolocumab  (REPATHA  SURECLICK) 140 MG/ML SOAJ INJECT 140 MG INTO THE SKIN EVERY 14 DAYS   fluconazole  (DIFLUCAN ) 150 MG tablet Take 1 now and can repeat 1 in 3 days if needed   glucosamine-chondroitin  500-400 MG tablet Take 1 tablet by mouth 3 (three) times daily.   hydrOXYzine (ATARAX) 25 MG tablet Take 25 mg by mouth every 8 (eight) hours as needed.   KLOR-CON  M20 20 MEQ tablet Take 20 mEq by mouth daily.   meloxicam (MOBIC) 7.5 MG tablet Take 7.5 mg by mouth as needed.  Multiple Vitamin (MULTIVITAMIN WITH MINERALS) TABS tablet Take 1 tablet by mouth daily.   nitroGLYCERIN  (NITROSTAT ) 0.4 MG SL tablet Place 1 tablet (0.4 mg total) under the tongue every 5 (five) minutes x 3 doses as needed.   Omega-3 Fatty Acids (FISH OIL PO) Take by mouth. 2 at night.   omeprazole (PRILOSEC) 40 MG capsule Take 40 mg by mouth in the morning and at bedtime.   OVER THE COUNTER MEDICATION Pre and probiotic daily   OVER THE COUNTER MEDICATION OTC supplement for brain   OVER THE COUNTER MEDICATION daily.   sertraline  (ZOLOFT ) 50 MG tablet Take 1 tablet (50 mg total) by mouth daily.   valsartan  (DIOVAN ) 40 MG tablet TAKE 1 TABLET BY MOUTH TWICE  DAILY ALONG WITH 80 MG TABLET  FOR TOTAL 120 MG TWICE DAILY   valsartan  (DIOVAN ) 80 MG tablet TAKE 1 TABLET BY MOUTH TWICE  DAILY ALONG WITH 40 MG TABLET  FOR TOTAL 120 MG   No facility-administered encounter medications on file as of 06/02/2024.   ALLERGIES: Allergies  Allergen Reactions   Pollen Extract Other (See Comments)    Sinus symptoms, headache   Rosuvastatin  Other (See Comments)    myalgias   Tape     Band aid-redness and brusing   Penicillins Rash    VACCINATION STATUS: Immunization History  Administered Date(s) Administered   Influenza,inj,Quad PF,6+ Mos 07/08/2019   Influenza-Unspecified 06/05/2015   Moderna Sars-Covid-2 Vaccination 11/07/2019, 12/09/2019    HPI Diane Parker is 68 y.o. female who presents today with a medical history as above. she is being seen in follow-up after she was seen in consultation for hypercortisolemia requested by Bertell Satterfield, MD. Patient denies any prior history of adrenal, thyroid , pituitary  dysfunctions. Due to diffuse arthralgias, she has exposure to steroids mainly intra-articular injections as well as inhaled steroid preparations prior to her procedure.  She is a former smoker.  She also gives history of unintended weight loss recently.   At one of her random lab work in October 2024 she was found to have random cortisol of 35.4.  This was repeated more recently in July 11,2025 at which time her a.m. cortisol was high at 54 and p.m. cortisol is high at 18.8.  She could not pinpoint the exact exposure of steroids to those labs at the moment.    However, she is almost sure that she was on steroids in July just before the lab work.  Associated ACTH with the most recent cortisol measurement was normal at 47.4 on March 14, 2024. Patient denies weight gain, easy bruising, body weight maldistribution. She presents with discordant previsit labs. She was sent for 24-hour urinary free cortisol which was 8 mcg / 24 hours.  Her midnight salivary cortisol was elevated at 0.235, however, admittedly patient did not prepare optimally for this test.   She has prediabetes with a recent A1c of 5.8%, not on treatment.  She is known to have severe dyslipidemia for which she is on Repatha  140 mg subcutaneously every 2 weeks. March 03, 2024 labs show 24-hour urine 5 HIAA was normal at 4.1.  Not exactly sure why this was measured.  Patient denies any wheezing, diarrhea, nausea, nor skin rash.  She does not have acute complaints today, denies any headaches.  She does not have any prior history of coronary artery disease nor CVA.  She does not have new complaints today.  Review of Systems  Constitutional: +mildly fluctuating body weight, no fatigue, no subjective hyperthermia,  no subjective hypothermia Eyes: no blurry vision, no xerophthalmia   Objective:       06/02/2024   10:36 AM 05/12/2024   10:20 AM 05/12/2024    9:59 AM  Vitals with BMI  Height 5' 3  5' 3  Weight 172 lbs 6 oz  171 lbs 8 oz   BMI 30.55  30.39  Systolic 134 178 830  Diastolic 78 84 67  Pulse 64 54 55    BP 134/78   Pulse 64   Ht 5' 3 (1.6 m)   Wt 172 lb 6.4 oz (78.2 kg)   BMI 30.54 kg/m   Wt Readings from Last 3 Encounters:  06/02/24 172 lb 6.4 oz (78.2 kg)  05/12/24 171 lb 8 oz (77.8 kg)  04/22/24 168 lb 3.2 oz (76.3 kg)    Physical Exam  Constitutional:  Body mass index is 30.54 kg/m.,  not in acute distress, normal state of mind Eyes: PERRLA, EOMI, no exophthalmos ENT: moist mucous membranes, no gross thyromegaly, no gross cervical lymphadenopathy  CMP ( most recent) CMP     Component Value Date/Time   NA 139 12/11/2023 1430   K 4.4 12/11/2023 1430   CL 102 12/11/2023 1430   CO2 20 12/11/2023 1430   GLUCOSE 103 (H) 12/11/2023 1430   GLUCOSE 101 (H) 09/20/2021 1200   BUN 28 (H) 12/11/2023 1430   CREATININE 1.32 (H) 12/11/2023 1430   CREATININE 0.99 03/26/2013 1538   CALCIUM  9.8 12/11/2023 1430   PROT 7.5 03/29/2017 1100   ALBUMIN 4.1 03/29/2017 1100   AST 24 03/29/2017 1100   ALT 21 03/29/2017 1100   ALKPHOS 53 03/29/2017 1100   BILITOT 0.5 03/29/2017 1100   EGFR 57.0 02/28/2024 0717   EGFR 44 (L) 12/11/2023 1430   GFRNONAA >60 09/20/2021 1200      Lipid Panel ( most recent) Lipid Panel     Component Value Date/Time   CHOL 148 03/19/2023 1132   TRIG 206 (H) 03/19/2023 1132   HDL 74 03/19/2023 1132   CHOLHDL 2.0 03/19/2023 1132   LDLCALC 42 03/19/2023 1132   LABVLDL 32 03/19/2023 1132      Lab Results  Component Value Date   TSH 1.87 02/28/2024   TSH 2.300 05/02/2021     March 14, 2024 labs: A.m. cortisol 54.0, p.m. cortisol 18.8-both are elevated ACTH was 47.4 inappropriately normal, A1c 5.8% June 22, 2023 random cortisol 35.4, TSH 1.87, 25-hydroxy vitamin D 80.5 February 27, 2024 labs: Total cholesterol 156, triglycerides 87, HDL 74, LDL 66 March 03, 2024 labs: 24-hour urine 5 HIAA 4.1 mcg per 24 hours BUN 16, creatinine 1.06   Recent Results (from the past  2160 hours)  NM PET CT CARDIAC PERFUSION MULTI W/ABSOLUTE BLOODFLOW     Status: None   Collection Time: 03/12/24  9:44 AM  Result Value Ref Range   Rest Nuclear Isotope Dose 20.3 mCi   Stress Nuclear Isotope Dose 20.0 mCi   ST Depression (mm) 0 mm   TID 1.09    Nuc Stress EF 65 %   Nuc Rest EF 64 %  Lab report - scanned     Status: None   Collection Time: 03/17/24  7:39 AM  Result Value Ref Range   A1c 5.8     Comment: ABS BY HIM  Cervicovaginal ancillary only( )     Status: Abnormal   Collection Time: 05/12/24 10:41 AM  Result Value Ref Range   Neisseria Gonorrhea Negative  Chlamydia Negative    Trichomonas Negative    Bacterial Vaginitis (gardnerella) Negative    Candida Vaginitis Positive (A)    Candida Glabrata Negative    Comment Normal Reference Ranger Chlamydia - Negative    Comment      Normal Reference Range Neisseria Gonorrhea - Negative   Comment Normal Reference Range Candida Species - Negative    Comment Normal Reference Range Candida Galbrata - Negative    Comment Normal Reference Range Trichomonas - Negative    Comment      Normal Reference Range Bacterial Vaginosis - Negative  POCT occult blood stool     Status: None   Collection Time: 05/12/24 10:44 AM  Result Value Ref Range   Fecal Occult Blood, POC Negative Negative   Card #1 Date     Card #2 Fecal Occult Blod, POC     Card #2 Date     Card #3 Fecal Occult Blood, POC     Card #3 Date    POCT Wet Prep [place])     Status: None   Collection Time: 05/12/24 10:44 AM  Result Value Ref Range   Source Wet Prep POC vagina    WBC, Wet Prep HPF POC pos    Bacteria Wet Prep HPF POC     Bacteria Wet Prep Morphology POC     Clue Cells Wet Prep HPF POC None None   Clue Cells Wet Prep Whiff POC Negative Whiff    Yeast Wet Prep HPF POC None None   KOH Wet Prep POC     Trichomonas Wet Prep HPF POC Absent Absent  Salivary Cortisol     Status: None   Collection Time: 05/19/24 11:40 AM  Result  Value Ref Range   Salivary Cortisol, MS 0.235 ug/dL    Comment: This test was developed and its performance characteristics determined by [facility]. It has not been cleared or approved by the Food and Drug Administration. Reference Range: Children and Adults: 8:00a.m.:   0.025 - 0.600 Noon:      <0.010 - 0.330 4:00p.m.:   0.010 - 0.200 Bedtime (9:00p.m.-Midnight):  <0.010 - 0.090   Cortisol, urine, free     Status: None   Collection Time: 05/19/24  3:19 PM  Result Value Ref Range   Cortisol,F,ug/L,U 3 Undefined ug/L   Cortisol (Ur), Free 8 6 - 42 ug/24 hr  Creatinine, urine, 24 hour     Status: None   Collection Time: 05/19/24  3:26 PM  Result Value Ref Range   Creatinine, Urine 55.1 Not Estab. mg/dL   Creatinine, 75Y Ur 8,594 800 - 1,800 mg/24 hr    Assessment & Plan:   1. Hypercortisolemia (HCC)  2.  Prediabetes   3.  Hyperlipidemia  - Diane Parker  is being seen at a kind request of Bertell Satterfield, MD. - I have reviewed her new and available  records and clinically evaluated the patient. - Based on these reviews, she presents with discordant findings between 24-hour urine free cortisol and elevated cortisol.  Cushing syndrome is not ruled out nor ruled in.   I had a discussion with her about repeating the salivary cortisol before her next visit. She is given a page of instructions to follow before collecting the midnight salivary cortisol.   Has metabolic syndrome, hypocortisolemia, prediabetes. Her clinical presentation is unrevealing for Cushing syndrome nor carcinoid syndrome.  The circumstance of her lab work is confounded by her exposure to various steroid preparations.  She did  not have any further exposure to steroids in recent weeks.  I had a long discussion with her that confirming Cushing syndrome will need several unequivocal truly positive test results.  If she returns with inconclusive lab results, she will be considered for repeat labs including with 1 mg  overnight dexamethasone  suppression test.  If she is biochemically confirmed to have Cushing's syndrome, she will be considered for further studies to locate source of hypercortisolemia   - She has prediabetes, and dyslipidemia requiring Repatha  intervention, she will benefit from weight loss, whole food plant-based diet was discussed with her. She is hesitant to consider plant-based diet for now. She is advised to stay away from smoking.  - she is advised to maintain close follow up with Bertell Satterfield, MD for primary care needs.  I spent  26  minutes in the care of the patient today including review of labs from Thyroid  Function, CMP, and other relevant labs ; imaging/biopsy records (current and previous including abstractions from other facilities); face-to-face time discussing  her lab results and symptoms, medications doses, her options of short and long term treatment based on the latest standards of care / guidelines;   and documenting the encounter.  Diane Parker  participated in the discussions, expressed understanding, and voiced agreement with the above plans.  All questions were answered to her satisfaction. she is encouraged to contact clinic should she have any questions or concerns prior to her return visit.   Follow up plan: Return in about 3 weeks (around 06/23/2024) for F/U with Pre-visit Labs.   Ranny Earl, MD [facility] [address] [place], [place] [address] Phone: [phone]  Fax: [phone]     06/02/2024, 12:09 PM  This note was partially dictated with voice recognition software. Similar sounding words can be transcribed inadequately or may not  be corrected upon review.

## 2024-06-21 LAB — LAB REPORT - SCANNED
A1c: 5.9
Albumin, Urine POC: 1.5
Creatinine, POC: 84 mg/dL
EGFR: 66
HM Hepatitis Screen: NEGATIVE
Microalb Creat Ratio: 18

## 2024-06-24 ENCOUNTER — Ambulatory Visit: Admitting: "Endocrinology

## 2024-06-26 LAB — CORTISOL, SALIVARY: Salivary Cortisol, MS: 0.039 ug/dL

## 2024-06-30 ENCOUNTER — Telehealth: Payer: Self-pay | Admitting: Orthopedic Surgery

## 2024-06-30 ENCOUNTER — Other Ambulatory Visit (INDEPENDENT_AMBULATORY_CARE_PROVIDER_SITE_OTHER)

## 2024-06-30 ENCOUNTER — Ambulatory Visit: Payer: 59 | Admitting: Orthopedic Surgery

## 2024-06-30 ENCOUNTER — Encounter: Payer: Self-pay | Admitting: Orthopedic Surgery

## 2024-06-30 VITALS — BP 134/78 | Ht 63.0 in | Wt 172.0 lb

## 2024-06-30 DIAGNOSIS — M19012 Primary osteoarthritis, left shoulder: Secondary | ICD-10-CM

## 2024-06-30 DIAGNOSIS — M25552 Pain in left hip: Secondary | ICD-10-CM

## 2024-06-30 NOTE — Progress Notes (Signed)
 d   06/30/2024   Chief Complaint  Patient presents with   Hip Pain    Left    Shoulder Pain    Left     Encounter Diagnosis  Name Primary?   Glenohumeral arthritis, left Yes    What pharmacy do you use ? [facility] or [facility]   DOI/DOS/ Date: ongoing pain for years   Worse Hip is bad but left hip pain is severe asking for xray today left shoulder   Wants us  to see the left shoulder today  Trying to take Meloxicam every other day instead of daily and not using the Tramadol  one every other day

## 2024-06-30 NOTE — Progress Notes (Signed)
   Patient: Diane Parker           Date of Birth: 04-01-1956           MRN: [id number] Visit Date: 06/30/2024 Requested by: Bertell Satterfield, MD [address] [place],  [place] [address] PCP: No primary care provider on file.  Encounter Diagnosis  Name Primary?   Glenohumeral arthritis, left Yes    Assessment and plan:  Glenohumeral arthritis left shoulder  Osteoarthritis left hip  Glenohumeral arthritis left shoulder patient was given the option of no intervention, intra-articular injection, physical therapy, surgery.  The patient would like to try home therapy and intra-articular injection  As far as the hip goes she is stable with mild to moderate pain controlled by meloxicam daily and hip exercises  Recheck in 6 weeks  No orders of the defined types were placed in this encounter.    Chief Complaint  Patient presents with   Hip Pain    Left    Shoulder Pain    Left     History:  68 year old female followed for osteoarthritis left hip with chronic OA says that her hip is in stable condition with home exercises and daily meloxicam  However she wants to have her left shoulder evaluated it is aching like a toothache and the meloxicam is not helping.  She is left-hand dominant.  She worked in a mill for several years.  She has noticed a decreased range of motion and some pain radiating into her neck from the shoulder  Focused exam findings:  Left shoulder  Decreased external rotation compared to the right her external rotation is 30 degrees on the left and 45 degrees on the right  Passive forward elevation in the scapular plane is 130 degrees active only 90 degrees  Her rotator cuff is weak with 4 out of 5 supraspinatus function  DG Shoulder Left Result Date: 06/30/2024 X-ray report left shoulder pain.  AP lateral left shoulder.  Patient has joint space narrowing of the glenohumeral joint with approximately 95% of the joint space loss with surrounding  osteophytes.  The humeral head height is normal. Impression osteoarthritis of the glenohumeral joint

## 2024-06-30 NOTE — Telephone Encounter (Signed)
 Call to schedule left shoulder injection IA at [facility]

## 2024-07-01 NOTE — Telephone Encounter (Signed)
 Nov 5th 1030 at [place] called patient to advise lm

## 2024-07-08 ENCOUNTER — Encounter: Payer: Self-pay | Admitting: "Endocrinology

## 2024-07-08 ENCOUNTER — Ambulatory Visit (INDEPENDENT_AMBULATORY_CARE_PROVIDER_SITE_OTHER): Admitting: "Endocrinology

## 2024-07-08 VITALS — BP 142/74 | HR 64 | Ht 63.0 in | Wt 173.8 lb

## 2024-07-08 DIAGNOSIS — R7303 Prediabetes: Secondary | ICD-10-CM | POA: Diagnosis not present

## 2024-07-08 DIAGNOSIS — E270 Other adrenocortical overactivity: Secondary | ICD-10-CM

## 2024-07-08 DIAGNOSIS — E782 Mixed hyperlipidemia: Secondary | ICD-10-CM

## 2024-07-08 NOTE — Progress Notes (Signed)
 07/08/2024, 4:51 PM   Endocrinology follow-up note  Subjective:    Patient ID: Diane Parker, female    DOB: May 26, 1956, PCP Venson Candis CROME, NP   Past Medical History:  Diagnosis Date   Acute diverticulitis 03/28/2013   Seen on CT - treated with flagyl  an cipro    Arthritis    shoulders, knees, hips (03/29/2017)   CAD (coronary artery disease), native coronary artery    03/29/17 PCI/DES to the mRCA, EF normal    Essential hypertension    GERD (gastroesophageal reflux disease)    Headache    bad ones lately; maybe q other day (03/29/2017)   High cholesterol    Past Surgical History:  Procedure Laterality Date   ABDOMINAL HYSTERECTOMY     BREAST BIOPSY Left 06/2021   Fibrocystic changes with calcifications   COLONOSCOPY  11/08/2007   MFM:Fjffpoojupnw, rectosigmoid junction/hyperplastic-appearing polypoid    mucosa, 1 of these areas biopsied.  Otherwise, normal rectum/Left-sided transverse diverticula/Diminutive polyp just distal to the ileocecal valve. Hyperplastic and adenomatous polyp.    COLONOSCOPY N/A 06/26/2013   MFM:Rnonwpr diverticulosis. Colonic polyps removed   COLONOSCOPY WITH PROPOFOL  N/A 09/22/2021   sigmoid and descending colon diverticulosis, two 3-4 mm polyps in ascending colon and cecum, one 20 mm polyp in ascending colon completely removed. Tubular adenomas. 2026 surveillance.   CORONARY ANGIOPLASTY WITH STENT PLACEMENT  03/29/2017   CORONARY STENT INTERVENTION N/A 03/29/2017   Procedure: Coronary Stent Intervention;  Surgeon: Court Dorn PARAS, MD;  Location: [facility] INVASIVE CV LAB;  Service: Cardiovascular;  Laterality: N/A;   DILATION AND CURETTAGE OF UTERUS     ESOPHAGOGASTRODUODENOSCOPY  2008   RMR: normal   ESOPHAGOGASTRODUODENOSCOPY (EGD) WITH PROPOFOL  N/A 09/22/2021   small hiatal hernia, otherwise normal   LEFT HEART CATH AND CORONARY ANGIOGRAPHY N/A 03/29/2017   Procedure: Left Heart Cath and  Coronary Angiography;  Surgeon: Court Dorn PARAS, MD;  Location: [facility] INVASIVE CV LAB;  Service: Cardiovascular;  Laterality: N/A;   POLYPECTOMY  09/22/2021   Procedure: POLYPECTOMY;  Surgeon: Shaaron Lamar HERO, MD;  Location: [facility];  Service: Endoscopy;;  cecal polyp, ascending colon polyp hot snare    TUBAL LIGATION     Social History   Socioeconomic History   Marital status: Single    Spouse name: Not on file   Number of children: Not on file   Years of education: Not on file   Highest education level: Not on file  Occupational History   Not on file  Tobacco Use   Smoking status: Former    Current packs/day: 0.00    Average packs/day: 0.1 packs/day for 3.0 years (0.4 ttl pk-yrs)    Types: Cigarettes    Start date: 34    Quit date: 38    Years since quitting: 35.8    Passive exposure: Current   Smokeless tobacco: Never  Vaping Use   Vaping status: Never Used  Substance and Sexual Activity   Alcohol use: Yes    Alcohol/week: 6.0 standard drinks of alcohol    Types: 6 Cans of beer per week   Drug use: Yes    Types: Marijuana    Comment: very seldom   Sexual activity: Not Currently  Birth control/protection: Surgical    Comment: tubal/hyst  Other Topics Concern   Not on file  Social History Narrative   Not on file   Social Drivers of Health   Financial Resource Strain: Low Risk  (05/12/2024)   Overall Financial Resource Strain (CARDIA)    Difficulty of Paying Living Expenses: Not hard at all  Food Insecurity: No Food Insecurity (05/12/2024)   Hunger Vital Sign    Worried About Running Out of Food in the Last Year: Never true    Ran Out of Food in the Last Year: Never true  Transportation Needs: No Transportation Needs (05/12/2024)   PRAPARE - Administrator, Civil Service (Medical): No    Lack of Transportation (Non-Medical): No  Physical Activity: Sufficiently Active (05/12/2024)   Exercise Vital Sign    Days of Exercise per Week: 7 days     Minutes of Exercise per Session: 50 min  Stress: Stress Concern Present (05/12/2024)   [employer] of Occupational Health - Occupational Stress Questionnaire    Feeling of Stress: To some extent  Social Connections: Socially Integrated (05/12/2024)   Social Connection and Isolation Panel    Frequency of Communication with Friends and Family: More than three times a week    Frequency of Social Gatherings with Friends and Family: More than three times a week    Attends Religious Services: 1 to 4 times per year    Active Member of [employer] or Organizations: Yes    Attends Banker Meetings: 1 to 4 times per year    Marital Status: Living with partner   Family History  Problem Relation Age of Onset   Heart disease Father    Aneurysm Mother    Cancer Cousin        breast   Other Daughter        PTSD   Colon cancer Neg Hx    Outpatient Encounter Medications as of 07/08/2024  Medication Sig   aspirin  81 MG chewable tablet Chew 1 tablet (81 mg total) by mouth daily.   b complex vitamins tablet Take 1 tablet by mouth daily.   Baclofen 5 MG TABS Take 5 mg by mouth at bedtime.   carvedilol  (COREG ) 12.5 MG tablet TAKE 1 TABLET BY MOUTH TWICE  DAILY   cetirizine (ZYRTEC) 10 MG tablet Take 10 mg by mouth daily as needed for allergies.   chlorthalidone  (HYGROTON ) 25 MG tablet Take 1 tablet (25 mg total) by mouth daily.   Cholecalciferol (VITAMIN D-3) 125 MCG (5000 UT) TABS Take 5,000 Units by mouth daily at 6 (six) AM.   diclofenac sodium (VOLTAREN) 1 % GEL Apply 2 g topically daily as needed (arthritis pain).   diltiazem  (CARDIZEM  LA) 180 MG 24 hr tablet Take 360 mg by mouth daily. Take 1 Tablet Daily. Taking 360 daily   estradiol  (ESTRACE ) 2 MG tablet TAKE 1 TABLET BY MOUTH DAILY   Evolocumab  (REPATHA  SURECLICK) 140 MG/ML SOAJ INJECT 140 MG INTO THE SKIN EVERY 14 DAYS   fluconazole  (DIFLUCAN ) 150 MG tablet Take 1 now and can repeat 1 in 3 days if needed   glucosamine-chondroitin  500-400 MG tablet Take 1 tablet by mouth 3 (three) times daily.   HYDROcodone-acetaminophen  (NORCO) 10-325 MG tablet Take 1 tablet by mouth 2 (two) times daily as needed.   hydrOXYzine (ATARAX) 25 MG tablet Take 25 mg by mouth every 8 (eight) hours as needed.   KLOR-CON  M20 20 MEQ tablet Take 20 mEq by  mouth daily.   meloxicam (MOBIC) 7.5 MG tablet Take 7.5 mg by mouth as needed.   Multiple Vitamin (MULTIVITAMIN WITH MINERALS) TABS tablet Take 1 tablet by mouth daily.   nitroGLYCERIN  (NITROSTAT ) 0.4 MG SL tablet Place 1 tablet (0.4 mg total) under the tongue every 5 (five) minutes x 3 doses as needed.   Omega-3 Fatty Acids (FISH OIL PO) Take by mouth. 2 at night.   omeprazole (PRILOSEC) 40 MG capsule Take 40 mg by mouth in the morning and at bedtime.   OVER THE COUNTER MEDICATION Pre and probiotic daily   OVER THE COUNTER MEDICATION OTC supplement for brain   OVER THE COUNTER MEDICATION daily.   sertraline  (ZOLOFT ) 50 MG tablet Take 1 tablet (50 mg total) by mouth daily.   traMADol  (ULTRAM ) 50 MG tablet Take 50 mg by mouth 2 (two) times daily.   traZODone (DESYREL) 50 MG tablet Take 50 mg by mouth at bedtime.   valsartan  (DIOVAN ) 40 MG tablet TAKE 1 TABLET BY MOUTH TWICE  DAILY ALONG WITH 80 MG TABLET  FOR TOTAL 120 MG TWICE DAILY   valsartan  (DIOVAN ) 80 MG tablet TAKE 1 TABLET BY MOUTH TWICE  DAILY ALONG WITH 40 MG TABLET  FOR TOTAL 120 MG   No facility-administered encounter medications on file as of 07/08/2024.   ALLERGIES: Allergies  Allergen Reactions   Pollen Extract Other (See Comments)    Sinus symptoms, headache   Rosuvastatin  Other (See Comments)    myalgias   Tape     Band aid-redness and brusing   Penicillins Rash    VACCINATION STATUS: Immunization History  Administered Date(s) Administered   Influenza,inj,Quad PF,6+ Mos 07/08/2019   Influenza-Unspecified 06/05/2015   Moderna Sars-Covid-2 Vaccination 11/07/2019, 12/09/2019    HPI Ednah SHERION DOOLY is 68 y.o. female  who presents today with a medical history as above. she is being seen in follow-up after she was seen in consultation for hypercortisolemia requested by Venson Candis CROME, NP. Patient denies any prior history of adrenal, thyroid , pituitary dysfunctions. Due to diffuse arthralgias, she has exposure to steroids mainly intra-articular injections as well as inhaled steroid preparations prior to her procedure.  She is a former smoker.  She also gives history of unintended weight loss recently.   At one of her random lab work in October 2024 she was found to have random cortisol of 35.4.  This was repeated more recently in July 11,2025 at which time her a.m. cortisol was high at 54 and p.m. cortisol is high at 18.8.  She could not pinpoint the exact exposure of steroids to those labs at the moment.    However, she is almost sure that she was on steroids in July just before the lab work.  Associated ACTH with the most recent cortisol measurement was normal at 47.4 on March 14, 2024. Patient denies weight gain, easy bruising, body weight maldistribution. She presents with discordant previsit labs. She was sent for 24-hour urinary free cortisol which was 8 mcg / 24 hours.  Her midnight salivary cortisol had to be repeated and returns 0.039 (normal 0.01-0.09), after patient was prepared for optimal test.  She has no new complaints today. She has prediabetes with a recent A1c of 5.8%, not on treatment.  She is known to have severe dyslipidemia for which she is on Repatha  140 mg subcutaneously every 2 weeks. March 03, 2024 labs show 24-hour urine 5 HIAA was normal at 4.1.  Not exactly sure why this was measured.  Patient  denies any wheezing, diarrhea, nausea, nor skin rash.  She does not have any prior history of coronary artery disease nor CVA.  She does not have new complaints today.  Review of Systems  Constitutional: +mildly fluctuating body weight, no fatigue, no subjective hyperthermia, no subjective  hypothermia Eyes: no blurry vision, no xerophthalmia   Objective:       07/08/2024    2:17 PM 06/30/2024    1:36 PM 06/02/2024   10:36 AM  Vitals with BMI  Height 5' 3 5' 3 5' 3  Weight 173 lbs 13 oz 172 lbs 172 lbs 6 oz  BMI 30.79 30.48 30.55  Systolic 142 134 865  Diastolic 74 78 78  Pulse 64  64    BP (!) 142/74   Pulse 64   Ht 5' 3 (1.6 m)   Wt 173 lb 12.8 oz (78.8 kg)   BMI 30.79 kg/m   Wt Readings from Last 3 Encounters:  07/08/24 173 lb 12.8 oz (78.8 kg)  06/30/24 172 lb (78 kg)  06/02/24 172 lb 6.4 oz (78.2 kg)    Physical Exam  Constitutional:  Body mass index is 30.79 kg/m.,  not in acute distress, normal state of mind Eyes: PERRLA, EOMI, no exophthalmos ENT: moist mucous membranes, no gross thyromegaly, no gross cervical lymphadenopathy  CMP ( most recent) CMP     Component Value Date/Time   NA 139 12/11/2023 1430   K 4.4 12/11/2023 1430   CL 102 12/11/2023 1430   CO2 20 12/11/2023 1430   GLUCOSE 103 (H) 12/11/2023 1430   GLUCOSE 101 (H) 09/20/2021 1200   BUN 28 (H) 12/11/2023 1430   CREATININE 1.32 (H) 12/11/2023 1430   CREATININE 0.99 03/26/2013 1538   CALCIUM  9.8 12/11/2023 1430   PROT 7.5 03/29/2017 1100   ALBUMIN 4.1 03/29/2017 1100   AST 24 03/29/2017 1100   ALT 21 03/29/2017 1100   ALKPHOS 53 03/29/2017 1100   BILITOT 0.5 03/29/2017 1100   EGFR 57.0 02/28/2024 0717   EGFR 44 (L) 12/11/2023 1430   GFRNONAA >60 09/20/2021 1200      Lipid Panel ( most recent) Lipid Panel     Component Value Date/Time   CHOL 148 03/19/2023 1132   TRIG 206 (H) 03/19/2023 1132   HDL 74 03/19/2023 1132   CHOLHDL 2.0 03/19/2023 1132   LDLCALC 42 03/19/2023 1132   LABVLDL 32 03/19/2023 1132      Lab Results  Component Value Date   TSH 1.87 02/28/2024   TSH 2.300 05/02/2021     March 14, 2024 labs: A.m. cortisol 54.0, p.m. cortisol 18.8-both are elevated ACTH was 47.4 inappropriately normal, A1c 5.8% June 22, 2023 random cortisol 35.4,  TSH 1.87, 25-hydroxy vitamin D 80.5 February 27, 2024 labs: Total cholesterol 156, triglycerides 87, HDL 74, LDL 66 March 03, 2024 labs: 24-hour urine 5 HIAA 4.1 mcg per 24 hours BUN 16, creatinine 1.06   Recent Results (from the past 2160 hours)  Cervicovaginal ancillary only( [place])     Status: Abnormal   Collection Time: 05/12/24 10:41 AM  Result Value Ref Range   Neisseria Gonorrhea Negative    Chlamydia Negative    Trichomonas Negative    Bacterial Vaginitis (gardnerella) Negative    Candida Vaginitis Positive (A)    Candida Glabrata Negative    Comment Normal Reference [place] Chlamydia - Negative    Comment      Normal Reference Range Neisseria Gonorrhea - Negative   Comment Normal Reference Range  Candida Species - Negative    Comment Normal Reference Range Candida Galbrata - Negative    Comment Normal Reference Range Trichomonas - Negative    Comment      Normal Reference Range Bacterial Vaginosis - Negative  POCT occult blood stool     Status: None   Collection Time: 05/12/24 10:44 AM  Result Value Ref Range   Fecal Occult Blood, POC Negative Negative   Card #1 Date     Card #2 Fecal Occult Blod, POC     Card #2 Date     Card #3 Fecal Occult Blood, POC     Card #3 Date    POCT Wet Prep [place])     Status: None   Collection Time: 05/12/24 10:44 AM  Result Value Ref Range   Source Wet Prep POC vagina    WBC, Wet Prep HPF POC pos    Bacteria Wet Prep HPF POC     Bacteria Wet Prep Morphology POC     Clue Cells Wet Prep HPF POC None None   Clue Cells Wet Prep Whiff POC Negative Whiff    Yeast Wet Prep HPF POC None None   KOH Wet Prep POC     Trichomonas Wet Prep HPF POC Absent Absent  Salivary Cortisol     Status: None   Collection Time: 05/19/24 11:40 AM  Result Value Ref Range   Salivary Cortisol, MS 0.235 ug/dL    Comment: This test was developed and its performance characteristics determined by [facility]. It has not been cleared or approved by the Food and  Drug Administration. Reference Range: Children and Adults: 8:00a.m.:   0.025 - 0.600 Noon:      <0.010 - 0.330 4:00p.m.:   0.010 - 0.200 Bedtime (9:00p.m.-Midnight):  <0.010 - 0.090   Cortisol, urine, free     Status: None   Collection Time: 05/19/24  3:19 PM  Result Value Ref Range   Cortisol,F,ug/L,U 3 Undefined ug/L   Cortisol (Ur), Free 8 6 - 42 ug/24 hr  Creatinine, urine, 24 hour     Status: None   Collection Time: 05/19/24  3:26 PM  Result Value Ref Range   Creatinine, Urine 55.1 Not Estab. mg/dL   Creatinine, 75Y Ur 8,594 800 - 1,800 mg/24 hr  Salivary Cortisol     Status: None   Collection Time: 06/16/24 11:30 PM  Result Value Ref Range   Salivary Cortisol, MS 0.039 ug/dL    Comment: This test was developed and its performance characteristics determined by [facility]. It has not been cleared or approved by the Food and Drug Administration. Reference Range: Children and Adults: 8:00a.m.:   0.025 - 0.600 Noon:      <0.010 - 0.330 4:00p.m.:   0.010 - 0.200 Bedtime (9:00p.m.-Midnight):  <0.010 - 0.090     Assessment & Plan:   1. Hypercortisolemia (HCC)  2.  Prediabetes   3.  Hyperlipidemia  - I have reviewed her new and available  records and clinically evaluated the patient. - Based on these reviews, she presents with 2 tests to rule out Cushing syndrome in this patient.   So far, there is no strong indication of Cushing syndrome with her repeat midnight salivary cortisol is normal after optimal preparation.  Has metabolic syndrome, hypocortisolemia, prediabetes. Her clinical presentation is unrevealing for Cushing syndrome nor carcinoid syndrome.  The circumstance of her lab work is confounded by her exposure to various steroid preparations.  She did not have any further exposure to  steroids in recent weeks.  She will not need any further workup for Cushing syndrome.  She will return to clinic as needed.  - She has prediabetes, and dyslipidemia requiring Repatha   intervention, she will benefit from weight loss, whole food plant-based diet was discussed with her. She is hesitant to consider plant-based diet for now. She is advised to stay away from smoking.  - she is advised to maintain close follow up with Venson Candis CROME, NP for primary care needs.  I spent  22  minutes in the care of the patient today including review of labs from Thyroid  Function, CMP, and other relevant labs ; imaging/biopsy records (current and previous including abstractions from other facilities); face-to-face time discussing  her lab results and symptoms, medications doses, her options of short and long term treatment based on the latest standards of care / guidelines;   and documenting the encounter.  Diane Parker  participated in the discussions, expressed understanding, and voiced agreement with the above plans.  All questions were answered to her satisfaction. she is encouraged to contact clinic should she have any questions or concerns prior to her return visit.   Follow up plan: Return if symptoms worsen or fail to improve.   Ranny Earl, MD [facility] [address] [place], [place] [address] Phone: [phone]  Fax: [phone]     07/08/2024, 4:51 PM  This note was partially dictated with voice recognition software. Similar sounding words can be transcribed inadequately or may not  be corrected upon review.

## 2024-07-08 NOTE — Telephone Encounter (Signed)
 Home number disconnected  Cell number mailbox is full I hope she got my previous message on cell The appointment is tomorrow

## 2024-07-09 ENCOUNTER — Ambulatory Visit (HOSPITAL_COMMUNITY)
Admission: RE | Admit: 2024-07-09 | Discharge: 2024-07-09 | Disposition: A | Discharge status: Home / Self Care | Source: Ambulatory Visit | Attending: Orthopedic Surgery | Admitting: Orthopedic Surgery

## 2024-07-09 DIAGNOSIS — M19012 Primary osteoarthritis, left shoulder: Secondary | ICD-10-CM | POA: Insufficient documentation

## 2024-07-09 MED ORDER — METHYLPREDNISOLONE ACETATE 40 MG/ML IJ SUSP
INTRAMUSCULAR | Status: AC
  Filled 2024-07-09: qty 1

## 2024-07-09 MED ORDER — LIDOCAINE HCL (PF) 1 % IJ SOLN
INTRAMUSCULAR | Status: AC
  Filled 2024-07-09: qty 5

## 2024-07-09 MED ORDER — BUPIVACAINE HCL (PF) 0.5 % IJ SOLN
INTRAMUSCULAR | Status: AC
  Filled 2024-07-09: qty 30

## 2024-07-09 MED ORDER — IOHEXOL 180 MG/ML  SOLN
10.0000 mL | Freq: Once | INTRAMUSCULAR | Status: AC | PRN
  Administered 2024-07-09: 10 mL

## 2024-07-09 MED ORDER — LIDOCAINE HCL (PF) 1 % IJ SOLN
10.0000 mL | Freq: Once | INTRAMUSCULAR | Status: AC
  Administered 2024-07-09: 10 mL via INTRADERMAL

## 2024-07-09 MED ORDER — METHYLPREDNISOLONE ACETATE 40 MG/ML INJ SUSP (RADIOLOG
120.0000 mg | Freq: Once | INTRAMUSCULAR | Status: AC
  Administered 2024-07-09: 40 mg via INTRA_ARTICULAR

## 2024-07-09 MED ORDER — BUPIVACAINE HCL 0.5 % IJ SOLN
50.0000 mL | Freq: Once | INTRAMUSCULAR | Status: DC
  Filled 2024-07-09: qty 50

## 2024-07-09 MED ORDER — METHYLPREDNISOLONE ACETATE 40 MG/ML IJ SUSP
INTRAMUSCULAR | Status: AC
Start: 2024-07-09 — End: 2024-07-09
  Filled 2024-07-09: qty 1

## 2024-07-09 MED ORDER — BUPIVACAINE HCL (PF) 0.5 % IJ SOLN
INTRAMUSCULAR | Status: AC
Start: 2024-07-09 — End: 2024-07-09
  Filled 2024-07-09: qty 30

## 2024-07-24 ENCOUNTER — Other Ambulatory Visit: Payer: Self-pay | Admitting: *Deleted

## 2024-07-24 ENCOUNTER — Other Ambulatory Visit: Payer: Self-pay

## 2024-07-24 DIAGNOSIS — I200 Unstable angina: Secondary | ICD-10-CM

## 2024-07-24 DIAGNOSIS — E785 Hyperlipidemia, unspecified: Secondary | ICD-10-CM

## 2024-07-24 DIAGNOSIS — I2510 Atherosclerotic heart disease of native coronary artery without angina pectoris: Secondary | ICD-10-CM

## 2024-07-24 MED ORDER — ESTRADIOL 2 MG PO TABS: 2.0000 mg | ORAL_TABLET | Freq: Every day | ORAL | 2 refills | Status: DC

## 2024-07-27 ENCOUNTER — Other Ambulatory Visit: Payer: Self-pay | Admitting: Cardiology

## 2024-07-27 ENCOUNTER — Other Ambulatory Visit: Payer: Self-pay | Admitting: Adult Health

## 2024-07-28 MED ORDER — REPATHA SURECLICK 140 MG/ML ~~LOC~~ SOAJ: 140.0000 mg | SUBCUTANEOUS | 1 refills | Status: DC

## 2024-07-28 MED ORDER — VALSARTAN 40 MG PO TABS: ORAL_TABLET | ORAL | 2 refills | Status: AC

## 2024-07-28 MED ORDER — CARVEDILOL 12.5 MG PO TABS: 12.5000 mg | ORAL_TABLET | Freq: Two times a day (BID) | ORAL | 2 refills | Status: AC

## 2024-07-28 MED ORDER — VALSARTAN 80 MG PO TABS: ORAL_TABLET | ORAL | 2 refills | Status: AC

## 2024-07-28 MED ORDER — NITROGLYCERIN 0.4 MG SL SUBL: 0.4000 mg | SUBLINGUAL_TABLET | SUBLINGUAL | 2 refills | Status: AC | PRN

## 2024-08-04 ENCOUNTER — Other Ambulatory Visit: Payer: Self-pay | Admitting: Cardiology

## 2024-08-04 DIAGNOSIS — I2510 Atherosclerotic heart disease of native coronary artery without angina pectoris: Secondary | ICD-10-CM

## 2024-08-04 DIAGNOSIS — I200 Unstable angina: Secondary | ICD-10-CM

## 2024-08-04 DIAGNOSIS — E785 Hyperlipidemia, unspecified: Secondary | ICD-10-CM

## 2024-08-04 MED ORDER — REPATHA SURECLICK 140 MG/ML ~~LOC~~ SOAJ: 140.0000 mg | SUBCUTANEOUS | 1 refills | Status: AC

## 2024-08-06 ENCOUNTER — Encounter (INDEPENDENT_AMBULATORY_CARE_PROVIDER_SITE_OTHER): Payer: Self-pay | Admitting: *Deleted

## 2024-08-11 ENCOUNTER — Ambulatory Visit: Admitting: Orthopedic Surgery

## 2024-08-21 ENCOUNTER — Ambulatory Visit: Admitting: Orthopedic Surgery

## 2024-08-21 DIAGNOSIS — M1612 Unilateral primary osteoarthritis, left hip: Secondary | ICD-10-CM | POA: Diagnosis not present

## 2024-08-21 DIAGNOSIS — M19012 Primary osteoarthritis, left shoulder: Secondary | ICD-10-CM | POA: Diagnosis not present

## 2024-08-21 NOTE — Progress Notes (Signed)
° °  Patient: Diane Parker           Date of Birth: 08/19/1956           MRN: [id number] Visit Date: 08/21/2024 Requested by: Venson Candis CROME, NP [address] [place],  [place] [address] PCP: Venson Candis CROME, NP  No diagnosis found.  Assessment and plan:  68 year old female with painful left shoulder status post intra-articular injection with some improvement in terms of pain relief and range of motion but still not satisfactory to the patient in terms of her overall function lifestyle and level of pain.  She would like to proceed with left shoulder replacement  She also has a left hip that needs a total arthroplasty and she would like to do an anterior approach  Follow-up to see my partner to discuss the shoulder first as it is the most symptomatic    No orders of the defined types were placed in this encounter.    Chief Complaint  Patient presents with   Shoulder Pain    Left    Hip Pain    left    History:  68 year old female osteoarthritis left shoulder status post intra-articular injection with some improvement  Also history of left hip pain diagnosed with osteoarthritis and taking meloxicam with some relief.  Patient feels that she would need replacements in both areas as the pain is not controlled satisfactorily  She would like to proceed with the shoulder first because it is the most painful  The left hip does give out on her at times because of pain when she takes a misstep.  It does not appear to be coming from her back   No results found.

## 2024-08-21 NOTE — Progress Notes (Signed)
° ° °  08/21/2024   Chief Complaint  Patient presents with   Shoulder Pain    Left    Hip Pain    left    No diagnosis found.  What pharmacy do you use ? ________[facility]___  DOI/DOS/ Date:    Did you get better, worse or no change (Answer below)   Improved states left shoulder and left hip feel better having some right leg pain evaluated for it yesterday by a doctor who told her it was from Neuropathy.

## 2024-08-22 ENCOUNTER — Encounter: Payer: Self-pay | Admitting: Cardiology

## 2024-09-14 ENCOUNTER — Other Ambulatory Visit (HOSPITAL_BASED_OUTPATIENT_CLINIC_OR_DEPARTMENT_OTHER): Payer: Self-pay | Admitting: Adult Health Nurse Practitioner

## 2024-09-14 DIAGNOSIS — Z1231 Encounter for screening mammogram for malignant neoplasm of breast: Secondary | ICD-10-CM

## 2024-09-17 ENCOUNTER — Other Ambulatory Visit: Payer: Self-pay | Admitting: Family

## 2024-09-17 ENCOUNTER — Encounter: Payer: Self-pay | Admitting: Gastroenterology

## 2024-09-17 DIAGNOSIS — N6452 Nipple discharge: Secondary | ICD-10-CM

## 2024-09-23 ENCOUNTER — Ambulatory Visit: Admitting: Physician Assistant

## 2024-09-30 ENCOUNTER — Other Ambulatory Visit: Payer: Self-pay | Admitting: Family

## 2024-09-30 DIAGNOSIS — N6452 Nipple discharge: Secondary | ICD-10-CM

## 2024-10-02 ENCOUNTER — Ambulatory Visit
Admission: RE | Admit: 2024-10-02 | Discharge: 2024-10-02 | Disposition: A | Discharge status: Home / Self Care | Source: Ambulatory Visit | Attending: Family | Admitting: Family

## 2024-10-02 DIAGNOSIS — N6452 Nipple discharge: Secondary | ICD-10-CM

## 2024-10-21 ENCOUNTER — Ambulatory Visit: Admitting: Orthopedic Surgery

## 2024-10-29 ENCOUNTER — Ambulatory Visit (HOSPITAL_BASED_OUTPATIENT_CLINIC_OR_DEPARTMENT_OTHER)

## 2024-11-06 ENCOUNTER — Ambulatory Visit: Admitting: Gastroenterology
